# Patient Record
Sex: Female | Born: 1960 | Race: White | Hispanic: No | Marital: Married | Smoking: Never smoker
Health system: Southern US, Community
[De-identification: ages and names within clinical notes are randomized; demographics above are authoritative.]

## PROBLEM LIST (undated history)

## (undated) DIAGNOSIS — C439 Malignant melanoma of skin, unspecified: Secondary | ICD-10-CM

## (undated) DIAGNOSIS — I10 Essential (primary) hypertension: Secondary | ICD-10-CM

## (undated) DIAGNOSIS — S82009A Unspecified fracture of unspecified patella, initial encounter for closed fracture: Secondary | ICD-10-CM

## (undated) DIAGNOSIS — E7800 Pure hypercholesterolemia, unspecified: Secondary | ICD-10-CM

## (undated) DIAGNOSIS — E039 Hypothyroidism, unspecified: Secondary | ICD-10-CM

## (undated) HISTORY — DX: Unspecified fracture of unspecified patella, initial encounter for closed fracture: S82.009A

## (undated) HISTORY — DX: Essential (primary) hypertension: I10

## (undated) HISTORY — DX: Hypothyroidism, unspecified: E03.9

## (undated) HISTORY — PX: BUNIONECTOMY: SHX129

## (undated) HISTORY — DX: Malignant melanoma of skin, unspecified: C43.9

## (undated) HISTORY — DX: Pure hypercholesterolemia, unspecified: E78.00

---

## 2004-11-14 ENCOUNTER — Ambulatory Visit: Payer: Self-pay | Admitting: Unknown Physician Specialty

## 2005-12-24 ENCOUNTER — Ambulatory Visit: Payer: Self-pay | Admitting: Unknown Physician Specialty

## 2006-12-29 ENCOUNTER — Ambulatory Visit: Payer: Self-pay | Admitting: Unknown Physician Specialty

## 2007-04-12 ENCOUNTER — Ambulatory Visit: Payer: Self-pay | Admitting: Internal Medicine

## 2007-05-06 ENCOUNTER — Ambulatory Visit: Payer: Self-pay | Admitting: Podiatry

## 2008-01-04 ENCOUNTER — Ambulatory Visit: Payer: Self-pay | Admitting: Unknown Physician Specialty

## 2008-02-28 ENCOUNTER — Ambulatory Visit: Payer: Self-pay | Admitting: Unknown Physician Specialty

## 2009-07-02 ENCOUNTER — Emergency Department: Payer: Self-pay | Admitting: Emergency Medicine

## 2010-01-22 ENCOUNTER — Ambulatory Visit: Payer: Self-pay | Admitting: Unknown Physician Specialty

## 2011-02-04 ENCOUNTER — Ambulatory Visit: Payer: Self-pay | Admitting: Unknown Physician Specialty

## 2012-01-07 LAB — HM PAP SMEAR: HM Pap smear: NEGATIVE

## 2012-09-30 ENCOUNTER — Encounter: Payer: Self-pay | Admitting: Internal Medicine

## 2012-09-30 ENCOUNTER — Ambulatory Visit (INDEPENDENT_AMBULATORY_CARE_PROVIDER_SITE_OTHER): Payer: BC Managed Care – PPO | Admitting: Internal Medicine

## 2012-09-30 VITALS — BP 114/75 | HR 65 | Temp 97.5°F | Ht 64.0 in | Wt 125.0 lb

## 2012-09-30 DIAGNOSIS — C439 Malignant melanoma of skin, unspecified: Secondary | ICD-10-CM

## 2012-09-30 DIAGNOSIS — E039 Hypothyroidism, unspecified: Secondary | ICD-10-CM

## 2012-09-30 DIAGNOSIS — I10 Essential (primary) hypertension: Secondary | ICD-10-CM

## 2012-09-30 DIAGNOSIS — E7800 Pure hypercholesterolemia, unspecified: Secondary | ICD-10-CM

## 2012-09-30 MED ORDER — LEVOTHYROXINE SODIUM 75 MCG PO TABS: 75.0000 ug | ORAL_TABLET | Freq: Every day | ORAL | Status: DC

## 2012-09-30 NOTE — Patient Instructions (Addendum)
It was nice seeing you today.  I am glad you have been doing well.  Let me know if you need anything. 

## 2012-09-30 NOTE — Assessment & Plan Note (Signed)
Seeing dermatology regularly.  

## 2012-09-30 NOTE — Progress Notes (Signed)
  Subjective:    Patient ID: Kathryn Francis, female    DOB: 1961-01-25, 51 y.o.   MRN: [id number]  HPI 51 year old female with past history of hypertension, hypercholesterolemia and hypothyroidism who comes in today for a scheduled follow up.  She states she is doing well.  Had an episode of increased heart racing several weeks ago.  Was seen at [facility].  Referred to Dr Darrold Junker.  Had a stress test and Holter.  Blood pressure was lower.  Lisinopril was decreased to 1/2 tablet per day.  She has adjusted her diet.  Lost weight.  Feels better.  Has been exercising.  No cardiac symptoms with increased activity or exertion.    Past Medical History  Diagnosis Date  . Hypertension   . Hypercholesterolemia   . Hypothyroidism   . Melanoma   . Patella fracture     right    Review of Systems Patient denies any headache, lightheadedness or dizziness.  No sinus or allergy symptoms.  No chest pain, tightness or palpitations currently.   No increased shortness of breath, cough or congestion.  No nausea or vomiting.  No abdominal pain or cramping.  No bowel change, such as diarrhea, constipation, BRBPR or melana.  No urine change.  Stays very physically active.       Objective:   Physical Exam Filed Vitals:   09/30/12 0830  BP: 114/75  Pulse: 65  Temp: 97.5 F (57.32 C)   51 year old female in no acute distress.   HEENT:  Nares - clear.  OP- without lesions or erythema.  NECK:  Supple, nontender.  No audible bruit.   HEART:  Appears to be regular. LUNGS:  Without crackles or wheezing audible.  Respirations even and unlabored.   RADIAL PULSE:  Equal bilaterally.  ABDOMEN:  Soft, nontender.  No audible abdominal bruit.   EXTREMITIES:  No increased edema to be present.                  Assessment & Plan:  CARDIOVASCULAR.  Had increased heart racing recently. Saw Dr Darrold Junker.  Had stress test and holter monitor. Lisinopril decreased.  Currently asymptomatic.  Stays physically active.  Check thyroid  function.    POSSIBLE RAYNAUD'S.  Stable.    HEALTH MAINTENANCE.  Breasts, pelvics and paps are done through Dr Santina Evans office.  Mammograms are done through there as well.  Obtain records.

## 2012-10-03 ENCOUNTER — Encounter: Payer: Self-pay | Admitting: Internal Medicine

## 2012-10-03 NOTE — Assessment & Plan Note (Signed)
Blood pressure under good control.  Will stop the Triam/HCTZ.  Follow pressures.  Check metabolic panel.

## 2012-10-03 NOTE — Assessment & Plan Note (Addendum)
Has adjusted her diet.  Lost weight.  Check lipid panel.  Not taking Lovaza.  On Lipitor.    

## 2012-10-03 NOTE — Assessment & Plan Note (Signed)
On Synthroid.  Check tsh.

## 2012-10-04 ENCOUNTER — Telehealth: Payer: Self-pay | Admitting: Internal Medicine

## 2012-10-04 ENCOUNTER — Other Ambulatory Visit (INDEPENDENT_AMBULATORY_CARE_PROVIDER_SITE_OTHER): Payer: BC Managed Care – PPO

## 2012-10-04 DIAGNOSIS — E7800 Pure hypercholesterolemia, unspecified: Secondary | ICD-10-CM

## 2012-10-04 DIAGNOSIS — I10 Essential (primary) hypertension: Secondary | ICD-10-CM

## 2012-10-04 DIAGNOSIS — E039 Hypothyroidism, unspecified: Secondary | ICD-10-CM

## 2012-10-04 LAB — LIPID PANEL
Cholesterol: 182 mg/dL (ref 0–200)
HDL: 46.3 mg/dL (ref >39.00)
LDL Cholesterol: 103 mg/dL — ABNORMAL HIGH (ref 0–99)
Total CHOL/HDL Ratio: 4
Triglycerides: 166 mg/dL — ABNORMAL HIGH (ref 0.0–149.0)
VLDL: 33.2 mg/dL (ref 0.0–40.0)

## 2012-10-04 LAB — BASIC METABOLIC PANEL
BUN: 13 mg/dL (ref 6–23)
CO2: 28 mEq/L (ref 19–32)
Calcium: 9.4 mg/dL (ref 8.4–10.5)
Chloride: 104 mEq/L (ref 96–112)
Creatinine, Ser: 0.8 mg/dL (ref 0.4–1.2)
GFR: 84.7 mL/min (ref >60.00)
Glucose, Bld: 88 mg/dL (ref 70–99)
Potassium: 4 mEq/L (ref 3.5–5.1)
Sodium: 140 mEq/L (ref 135–145)

## 2012-10-04 LAB — TSH: TSH: 2.12 u[IU]/mL (ref 0.35–5.50)

## 2012-10-04 NOTE — Telephone Encounter (Signed)
Pt notified of lab results via my chart messaging.  

## 2012-10-07 ENCOUNTER — Encounter: Payer: Self-pay | Admitting: Internal Medicine

## 2012-10-14 ENCOUNTER — Encounter: Payer: Self-pay | Admitting: Adult Health

## 2012-10-14 ENCOUNTER — Ambulatory Visit (INDEPENDENT_AMBULATORY_CARE_PROVIDER_SITE_OTHER): Payer: BC Managed Care – PPO | Admitting: Adult Health

## 2012-10-14 ENCOUNTER — Telehealth: Payer: Self-pay | Admitting: General Practice

## 2012-10-14 VITALS — BP 120/70 | HR 88 | Temp 97.6°F | Ht 64.0 in | Wt 125.8 lb

## 2012-10-14 DIAGNOSIS — M545 Low back pain, unspecified: Secondary | ICD-10-CM

## 2012-10-14 MED ORDER — HYDROCODONE-ACETAMINOPHEN 5-325 MG PO TABS: 1.0000 | ORAL_TABLET | Freq: Four times a day (QID) | ORAL | Status: DC | PRN

## 2012-10-14 MED ORDER — CYCLOBENZAPRINE HCL 5 MG PO TABS: 5.0000 mg | ORAL_TABLET | Freq: Three times a day (TID) | ORAL | Status: DC | PRN

## 2012-10-14 NOTE — Patient Instructions (Addendum)
  Use ice for 20 mins to affected area to reduce swelling. You can alternate with heat to relax the muscles.  Flexeril is a muscle relaxer. This can make you drowsy. Please use caution when you take this.  Norco is a pain medication. This can also make you drowsy.  Place a pillow below your knees when you lie on your back. Place a pillow between your knees if you sleep on your side.  Your symptoms should begin to improve. If they have not done so by Monday, please call us.

## 2012-10-14 NOTE — Telephone Encounter (Signed)
Pt fell and slipped hit her back. Neighbor is an OT and said they thought she hit her sciatic nerve. Happened Christmas eve. Scheduled appt today at 2:30. Advised OTC pain meds have not helped pt has been unable to sleep.

## 2012-10-14 NOTE — Progress Notes (Signed)
  Subjective:    Patient ID: Kathryn Francis, female    DOB: 07-03-1961, 51 y.o.   MRN: [id number]  HPI  Ms. Delahoussaye presents to clinic today s/p fall on Christmas Eve. She slipped and fell, hitting her lower back mostly on the right, lower side. She continued on with her activities without any problems. When she woke up on Christmas day, she was in pain and found it hard to move around. She has tried heat to the area and advil for pain without much relief. She has not noticed any swelling or bruising of the area. She is c/o pain that shoots down the right buttocks and leg.  Current Outpatient Prescriptions on File Prior to Visit  Medication Sig Dispense Refill  . atorvastatin (LIPITOR) 10 MG tablet Take 10 mg by mouth daily.      Marland Kitchen levothyroxine (SYNTHROID) 75 MCG tablet Take 1 tablet (75 mcg total) by mouth daily.  30 tablet  5  . lisinopril (PRINIVIL,ZESTRIL) 10 MG tablet Take 1/2 tablet once a day      . metoprolol tartrate (LOPRESSOR) 25 MG tablet Take 1/2 tablet bid      . triamterene-hydrochlorothiazide (MAXZIDE-25) 37.5-25 MG per tablet Take 1 tablet by mouth daily.         Review of Systems  Constitutional: Positive for activity change. Negative for appetite change and fatigue.  Respiratory: Negative.   Cardiovascular: Negative.   Musculoskeletal: Positive for back pain.  Neurological: Negative for weakness and numbness.  Psychiatric/Behavioral: Negative.         Objective:   Physical Exam  Constitutional: She is oriented to person, place, and time. She appears well-developed and well-nourished.  Cardiovascular: Normal rate and regular rhythm.   Pulmonary/Chest: Breath sounds normal.  Musculoskeletal:       Guarded movement. Pain noted on palpation of lower back.   Neurological: She is alert and oriented to person, place, and time. She exhibits normal muscle tone. Coordination normal.  Skin:       No bruising noted on back.  Psychiatric: She has a normal mood and affect. Her  behavior is normal. Judgment and thought content normal.          Assessment & Plan:

## 2012-10-14 NOTE — Assessment & Plan Note (Signed)
Secondary to fall. Sciatic nerve involvement. Ice/heat to area. Flexeril for muscle spasms and relaxation. Norco for pain.

## 2012-11-15 ENCOUNTER — Telehealth: Payer: Self-pay | Admitting: *Deleted

## 2012-11-15 NOTE — Telephone Encounter (Signed)
Need to clarify with pt how she is taking this medication and how often.  Let me know.

## 2012-11-15 NOTE — Telephone Encounter (Signed)
Rx request:  Zolpidem Tartrate 10 mg  Take 1 tablet by mouth at bedtime as needed

## 2012-11-16 MED ORDER — ZOLPIDEM TARTRATE 10 MG PO TABS: ORAL_TABLET | ORAL | Status: DC

## 2012-11-16 NOTE — Telephone Encounter (Signed)
Prescription faxed to pharmacy.

## 2012-11-16 NOTE — Telephone Encounter (Signed)
Called patient about medication. Patient stated that she that Zolpidem Tartrate 10 mg 1/2 tablet at bedtime as needed.

## 2012-11-16 NOTE — Telephone Encounter (Signed)
rx printed for ambien refill.  Ok to call or fax in.  Please notify pt when done.

## 2012-12-04 ENCOUNTER — Other Ambulatory Visit: Payer: Self-pay

## 2012-12-08 ENCOUNTER — Encounter: Payer: Self-pay | Admitting: Internal Medicine

## 2012-12-08 ENCOUNTER — Telehealth: Payer: Self-pay | Admitting: *Deleted

## 2012-12-08 NOTE — Telephone Encounter (Signed)
Patient stated that she has had the headache since Sunday morning when she woke. The headache has not changed it has stayed at a dull pain and only in her forehead. Patient noticed heart racing Monday morning. She is not having chest pains. Advised patient if symptoms worsen to go to acute care to get evaluation.

## 2012-12-08 NOTE — Telephone Encounter (Signed)
I am concerned that heart palpitations may represent atrial fibrillation or other arrhythmia. Please schedule pt for visit tomorrow.  Agree that she should proceed to ED for evaluation sooner if symptoms of heart palpitations are persistent or if any chest pain.

## 2012-12-08 NOTE — Telephone Encounter (Signed)
Called patient concerning my chart message stating that she has been having a dull headache since Sunday. Also that her heart has been racing off and on since Monday. Patient stated that the headache has been a dull ache in the front of her forehead and it has remained the same since she woke up on Sunday morning. Patient also stated that she noticed that her heart was racing Monday morning. Her heart has continued to race off and on since. Patient stated that she does not have any sinus problems. Patient also stated that she thought that heart racing could pertain to caffeine. Advised patient to seek acute care if symptoms worsen or if she experiences any chest pain. Patient stated that she would.

## 2012-12-09 NOTE — Telephone Encounter (Signed)
Has appointment Raquel tomorrow.

## 2012-12-10 ENCOUNTER — Encounter: Payer: Self-pay | Admitting: Adult Health

## 2012-12-10 ENCOUNTER — Ambulatory Visit (INDEPENDENT_AMBULATORY_CARE_PROVIDER_SITE_OTHER): Payer: BC Managed Care – PPO | Admitting: Adult Health

## 2012-12-10 VITALS — BP 110/70 | HR 62 | Temp 97.8°F | Resp 12 | Wt 122.0 lb

## 2012-12-10 DIAGNOSIS — R519 Headache, unspecified: Secondary | ICD-10-CM

## 2012-12-10 DIAGNOSIS — R51 Headache: Secondary | ICD-10-CM

## 2012-12-10 MED ORDER — FLUTICASONE PROPIONATE 50 MCG/ACT NA SUSP: NASAL | Status: DC

## 2012-12-10 NOTE — Assessment & Plan Note (Signed)
Symptoms not consistent with migraine or cluster headache. Not consistent with tension HA. Not consistent with temporal arteritis  Frontal sinus pressure ?  Will order flonase nasal spray, sudafed PE and tylenol. If patient not improved by Monday, I have asked her to come in for labs - sed rate.

## 2012-12-10 NOTE — Patient Instructions (Addendum)
  I have ordered Flonase nasal spray. This is a steroid spray that will help decrease any inflammation of the sinuses.  Try Sudafed PE which is a decongestant.  Also try some saline spray to use multiple times throughout the day to help keep your sinuses flushed.  Try tylenol 325 mg - 2 tablets every 4-6 hours as needed for pain.  If you do not feel any improvement by Monday, please call so that we can order some labs.

## 2012-12-10 NOTE — Progress Notes (Signed)
Subjective:    Patient ID: Kathryn Francis, female    DOB: 1961/01/21, 52 y.o.   MRN: [id number]  HPI  Patient is a pleasant 52 y/o female who presents to clinic today with c/o frontal HA since Sunday morning when she woke up. She describes the pain as a dull ache. She tried advil 600 mg x 2 on Monday and tried 2 Aleve on Tuesday. Her symptoms did not improve so she did not take any more. She denies visual disturbances, n/v, dizziness. She does not feel any sinus pressure. She has not been sick recently. She has not changed any medications. Her pain is strictly on forehead. No temporal tenderness or throbbing.   Current Outpatient Prescriptions on File Prior to Visit  Medication Sig Dispense Refill  . atorvastatin (LIPITOR) 10 MG tablet Take 10 mg by mouth daily.      Marland Kitchen levothyroxine (SYNTHROID) 75 MCG tablet Take 1 tablet (75 mcg total) by mouth daily.  30 tablet  5  . lisinopril (PRINIVIL,ZESTRIL) 10 MG tablet Take 1/2 tablet once a day      . metoprolol tartrate (LOPRESSOR) 25 MG tablet Take 1/2 tablet daily      . zolpidem (AMBIEN) 10 MG tablet 1/2 tablet q hs prn.  15 tablet  0  . cyclobenzaprine (FLEXERIL) 5 MG tablet Take 1 tablet (5 mg total) by mouth 3 (three) times daily as needed for muscle spasms.  30 tablet  1  . HYDROcodone-acetaminophen (NORCO) 5-325 MG per tablet Take 1 tablet by mouth every 6 (six) hours as needed for pain.  30 tablet  0  . triamterene-hydrochlorothiazide (MAXZIDE-25) 37.5-25 MG per tablet Take 1 tablet by mouth daily.       No current facility-administered medications on file prior to visit.     Review of Systems  Constitutional: Negative for fever and chills.  HENT: Negative for ear pain, congestion, sore throat, facial swelling, rhinorrhea, neck pain, postnasal drip, sinus pressure, tinnitus and ear discharge.   Eyes: Negative for photophobia, pain and visual disturbance.  Respiratory: Negative for cough, chest tightness and shortness of breath.    Cardiovascular: Negative for chest pain.       Patient has an irregular heartbeat which has recently been evaluated by cardiology.  Gastrointestinal: Negative for nausea, vomiting and diarrhea.  Neurological: Positive for headaches. Negative for dizziness, seizures, syncope, weakness, light-headedness and numbness.       Headache in forehead area ongoing since Sunday.  Psychiatric/Behavioral: Negative.     BP 110/70  Pulse 62  Temp(Src) 97.8 F (36.6 C) (Oral)  Wt 122 lb (55.339 kg)  BMI 20.93 kg/m2  SpO2 97%     Objective:   Physical Exam  Constitutional: She is oriented to person, place, and time. She appears well-developed and well-nourished. No distress.  HENT:  Head: Normocephalic and atraumatic.  Right Ear: External ear normal.  Left Ear: External ear normal.  Mouth/Throat: Oropharynx is clear and moist.  No pain or tenderness with palpating sinuses. Nasal mucosa is slightly erythematous. No tenderness over temporal area. TM are WNL.  Neck: Normal range of motion. Neck supple.  Cardiovascular: Normal rate.   Irregularly, regular rhythm  Pulmonary/Chest: Effort normal and breath sounds normal.  Musculoskeletal: Normal range of motion. She exhibits no edema and no tenderness.  No neck tenderness. Full ROM without tenderness/pain produced.  Neurological: She is alert and oriented to person, place, and time.  Psychiatric: She has a normal mood and affect. Her behavior is normal. Judgment  and thought content normal.        Assessment & Plan:

## 2012-12-12 ENCOUNTER — Encounter: Payer: Self-pay | Admitting: Adult Health

## 2012-12-28 ENCOUNTER — Other Ambulatory Visit: Payer: Self-pay | Admitting: *Deleted

## 2012-12-29 MED ORDER — METOPROLOL TARTRATE 25 MG PO TABS: ORAL_TABLET | ORAL | Status: DC

## 2012-12-29 NOTE — Telephone Encounter (Signed)
Sent in to pharmacy.  

## 2013-02-17 LAB — HM MAMMOGRAPHY

## 2013-03-30 ENCOUNTER — Telehealth: Payer: Self-pay | Admitting: Internal Medicine

## 2013-03-30 ENCOUNTER — Ambulatory Visit (INDEPENDENT_AMBULATORY_CARE_PROVIDER_SITE_OTHER): Payer: BC Managed Care – PPO | Admitting: Internal Medicine

## 2013-03-30 ENCOUNTER — Encounter: Payer: Self-pay | Admitting: Internal Medicine

## 2013-03-30 VITALS — BP 110/70 | HR 69 | Temp 98.1°F | Ht 64.0 in | Wt 123.5 lb

## 2013-03-30 DIAGNOSIS — Z1211 Encounter for screening for malignant neoplasm of colon: Secondary | ICD-10-CM

## 2013-03-30 DIAGNOSIS — R51 Headache: Secondary | ICD-10-CM

## 2013-03-30 DIAGNOSIS — R519 Headache, unspecified: Secondary | ICD-10-CM

## 2013-03-30 DIAGNOSIS — E7800 Pure hypercholesterolemia, unspecified: Secondary | ICD-10-CM

## 2013-03-30 DIAGNOSIS — E039 Hypothyroidism, unspecified: Secondary | ICD-10-CM

## 2013-03-30 DIAGNOSIS — C439 Malignant melanoma of skin, unspecified: Secondary | ICD-10-CM

## 2013-03-30 DIAGNOSIS — I10 Essential (primary) hypertension: Secondary | ICD-10-CM

## 2013-03-30 NOTE — Assessment & Plan Note (Signed)
Not an issue now.  Follow.    

## 2013-03-30 NOTE — Progress Notes (Signed)
Subjective:    Patient ID: Kathryn Francis, female    DOB: [birth year], 52 y.o.   MRN: [id number]  HPI 52 year old female with past history of hypertension, hypercholesterolemia and hypothyroidism who comes in today for a scheduled follow up.  She states she is doing well.   She has adjusted her diet.  Lost weight.  Feels better.  Has been exercising.  No cardiac symptoms with increased activity or exertion.  Occasionally will notice some increased heart rate when she has an excessive amount of caffeine.  Much better.  If she watches what she is eating/drinking - does well.  Overall feels good.    Past Medical History  Diagnosis Date  . Hypertension   . Hypercholesterolemia   . Hypothyroidism   . Melanoma   . Patella fracture     right    Current Outpatient Prescriptions on File Prior to Visit  Medication Sig Dispense Refill  . atorvastatin (LIPITOR) 10 MG tablet Take 10 mg by mouth daily.      Marland Kitchen levothyroxine (SYNTHROID) 75 MCG tablet Take 1 tablet (75 mcg total) by mouth daily.  30 tablet  5  . lisinopril (PRINIVIL,ZESTRIL) 10 MG tablet Take 1/2 tablet once a day      . metoprolol tartrate (LOPRESSOR) 25 MG tablet Take 1/2 tablet daily  30 tablet  2  . zolpidem (AMBIEN) 10 MG tablet 1/2 tablet q hs prn.  15 tablet  0  . cyclobenzaprine (FLEXERIL) 5 MG tablet Take 1 tablet (5 mg total) by mouth 3 (three) times daily as needed for muscle spasms.  30 tablet  1  . fluticasone (FLONASE) 50 MCG/ACT nasal spray 2 sprays in each nostril daily  16 g  6  . HYDROcodone-acetaminophen (NORCO) 5-325 MG per tablet Take 1 tablet by mouth every 6 (six) hours as needed for pain.  30 tablet  0  . triamterene-hydrochlorothiazide (MAXZIDE-25) 37.5-25 MG per tablet Take 1 tablet by mouth daily.       No current facility-administered medications on file prior to visit.    Review of Systems Patient denies any headache, lightheadedness or dizziness.  No sinus or allergy symptoms.  No chest pain, tightness  or palpitations currently.   Palpitations improved.  No increased shortness of breath, cough or congestion.  No nausea or vomiting.  No acid reflux.  No abdominal pain or cramping. No bowel change, such as diarrhea, constipation, BRBPR or melana.  No urine change.  Stays very physically active.  No cardiac symptoms with increased activity or exertion.       Objective:   Physical Exam  Filed Vitals:   03/30/13 0800  BP: 110/70  Pulse: 69  Temp: 98.1 F (76.101 C)   52 year old female in no acute distress.   HEENT:  Nares - clear.  OP- without lesions or erythema.  NECK:  Supple, nontender.  No audible bruit.   HEART:  Appears to be regular. LUNGS:  Without crackles or wheezing audible.  Respirations even and unlabored.   RADIAL PULSE:  Equal bilaterally.  ABDOMEN:  Soft, nontender.  No audible abdominal bruit.   EXTREMITIES:  No increased edema to be present.                  Assessment & Plan:  CARDIOVASCULAR.  Had increased heart racing recently. Saw Dr Darrold Junker.  Had stress test and holter monitor. Lisinopril decreased.  Currently asymptomatic.  Stays physically active.   POSSIBLE RAYNAUD'S.  Stable.  HEALTH MAINTENANCE.  Breasts, pelvics and paps are done through Dr Santina Evans office.  Mammograms are done through there as well.  Obtain records.  Discussed with her regarding colon screening.  She was agreeable.  Will refer to GI for evaluation for screening colonoscopy.

## 2013-03-30 NOTE — Assessment & Plan Note (Signed)
Seeing dermatology regularly.  Just evaluated.  States everything checked out fine.

## 2013-03-30 NOTE — Assessment & Plan Note (Signed)
Has adjusted her diet.  Lost weight.  Check lipid panel.  Not taking Lovaza.  On Lipitor.  Last lipid profile much improved.

## 2013-03-30 NOTE — Assessment & Plan Note (Signed)
On Synthroid.  Follow tsh.    

## 2013-03-30 NOTE — Assessment & Plan Note (Signed)
Blood pressure under good control.  Off Triam/HCTZ.  Blood pressure doing well.  Follow metabolic panel.   

## 2013-03-30 NOTE — Telephone Encounter (Signed)
Pt wanted to see if you could get her colonscopy scheduled for either week of 05/16/13 or week of 05/30/13 if possible

## 2013-04-06 ENCOUNTER — Telehealth: Payer: Self-pay | Admitting: *Deleted

## 2013-04-06 NOTE — Telephone Encounter (Signed)
Patient concerned about colonoscopy referral has not received a call from office. Please advise patient stated Dr. Lorin Picket was making referral.

## 2013-04-07 NOTE — Telephone Encounter (Signed)
[facility] is on a 30-25 day waiting period to schedule a screening colonoscopy.

## 2013-04-07 NOTE — Telephone Encounter (Signed)
See note Re: referral

## 2013-04-07 NOTE — Telephone Encounter (Signed)
Left message on pt's voicemail with referral update

## 2013-04-15 ENCOUNTER — Other Ambulatory Visit: Payer: BC Managed Care – PPO

## 2013-04-27 ENCOUNTER — Other Ambulatory Visit: Payer: Self-pay | Admitting: *Deleted

## 2013-04-27 MED ORDER — LISINOPRIL 10 MG PO TABS: 5.0000 mg | ORAL_TABLET | Freq: Every day | ORAL | Status: DC

## 2013-05-01 ENCOUNTER — Encounter: Payer: Self-pay | Admitting: Internal Medicine

## 2013-05-03 ENCOUNTER — Other Ambulatory Visit (INDEPENDENT_AMBULATORY_CARE_PROVIDER_SITE_OTHER): Payer: BC Managed Care – PPO

## 2013-05-03 ENCOUNTER — Telehealth: Payer: Self-pay | Admitting: Internal Medicine

## 2013-05-03 DIAGNOSIS — E7800 Pure hypercholesterolemia, unspecified: Secondary | ICD-10-CM

## 2013-05-03 DIAGNOSIS — I10 Essential (primary) hypertension: Secondary | ICD-10-CM

## 2013-05-03 LAB — BASIC METABOLIC PANEL
BUN: 14 mg/dL (ref 6–23)
CO2: 28 mEq/L (ref 19–32)
Calcium: 9.7 mg/dL (ref 8.4–10.5)
Chloride: 106 mEq/L (ref 96–112)
Creatinine, Ser: 0.8 mg/dL (ref 0.4–1.2)
GFR: 76.34 mL/min (ref >60.00)
Glucose, Bld: 91 mg/dL (ref 70–99)
Potassium: 4.7 mEq/L (ref 3.5–5.1)
Sodium: 140 mEq/L (ref 135–145)

## 2013-05-03 LAB — LIPID PANEL
Cholesterol: 200 mg/dL (ref 0–200)
HDL: 48.8 mg/dL (ref >39.00)
LDL Cholesterol: 113 mg/dL — ABNORMAL HIGH (ref 0–99)
Total CHOL/HDL Ratio: 4
Triglycerides: 189 mg/dL — ABNORMAL HIGH (ref 0.0–149.0)
VLDL: 37.8 mg/dL (ref 0.0–40.0)

## 2013-05-03 MED ORDER — LEVOTHYROXINE SODIUM 75 MCG PO TABS
75.0000 ug | ORAL_TABLET | Freq: Every day | ORAL | Status: DC
Start: 2013-05-03 — End: 2014-05-23

## 2013-05-03 NOTE — Telephone Encounter (Signed)
Pt came in today for labs and checking on her 2 rx  On was linisipril and her thyroid med Please advise

## 2013-05-03 NOTE — Telephone Encounter (Signed)
Lisinopril was refilled on 04/27/13, I sent in refill today for Levothyroxine. Pt informed

## 2013-05-04 ENCOUNTER — Encounter: Payer: Self-pay | Admitting: Internal Medicine

## 2013-05-18 ENCOUNTER — Other Ambulatory Visit: Payer: Self-pay | Admitting: *Deleted

## 2013-05-18 MED ORDER — ZOLPIDEM TARTRATE 10 MG PO TABS: ORAL_TABLET | ORAL | Status: DC

## 2013-05-20 ENCOUNTER — Ambulatory Visit: Payer: Self-pay | Admitting: Adult Health

## 2013-05-20 ENCOUNTER — Other Ambulatory Visit: Payer: Self-pay | Admitting: Internal Medicine

## 2013-05-20 ENCOUNTER — Encounter: Payer: Self-pay | Admitting: Adult Health

## 2013-05-20 ENCOUNTER — Ambulatory Visit (INDEPENDENT_AMBULATORY_CARE_PROVIDER_SITE_OTHER): Payer: BC Managed Care – PPO | Admitting: Adult Health

## 2013-05-20 VITALS — BP 112/70 | HR 87 | Temp 97.8°F | Resp 12 | Wt 125.0 lb

## 2013-05-20 DIAGNOSIS — T148XXA Other injury of unspecified body region, initial encounter: Secondary | ICD-10-CM

## 2013-05-20 MED ORDER — CEPHALEXIN 500 MG PO CAPS: 500.0000 mg | ORAL_CAPSULE | Freq: Two times a day (BID) | ORAL | Status: DC

## 2013-05-20 NOTE — Assessment & Plan Note (Signed)
Will send for x-ray to evaluate for foreign object. Treat empirically for infection with Keflex 500 mg twice a day x10 days. Keep the area clean and dry. May apply Neosporin.

## 2013-05-20 NOTE — Progress Notes (Signed)
  Subjective:    Patient ID: Kathryn Francis, female    DOB: 1961/01/31, 52 y.o.   MRN: [id number]  HPI Patient is a pleasant 52 year old female who presents to clinic with left heel puncture wound. [place] when she stepped on something sharp. She reports mild bleeding which stopped quickly. She did not notice anything stuck in her foot. No fever, chills. No drainage from area. She reports her heel is becoming more tender and very hard to walk on. She has also noticed a dark area within the small wound and she is wondering if there is something stuck in there. She has been using peroxide to clean the area.  Current Outpatient Prescriptions on File Prior to Visit  Medication Sig Dispense Refill  . atorvastatin (LIPITOR) 10 MG tablet Take 10 mg by mouth daily.      . fluticasone (FLONASE) 50 MCG/ACT nasal spray 2 sprays in each nostril daily  16 g  6  . HYDROcodone-acetaminophen (NORCO) 5-325 MG per tablet Take 1 tablet by mouth every 6 (six) hours as needed for pain.  30 tablet  0  . levothyroxine (SYNTHROID) 75 MCG tablet Take 1 tablet (75 mcg total) by mouth daily.  30 tablet  11  . lisinopril (PRINIVIL,ZESTRIL) 10 MG tablet Take 0.5 tablets (5 mg total) by mouth daily. Take 1/2 tablet once a day  15 tablet  5  . metoprolol tartrate (LOPRESSOR) 25 MG tablet Take 1/2 tablet daily  30 tablet  2  . triamterene-hydrochlorothiazide (MAXZIDE-25) 37.5-25 MG per tablet Take 1 tablet by mouth daily.      Marland Kitchen zolpidem (AMBIEN) 10 MG tablet 1/2 tablet q hs prn.  15 tablet  0   No current facility-administered medications on file prior to visit.    Review of Systems  Skin:       Puncture injury left heel       Objective:   Physical Exam  Skin:  Small puncture wound approximately half inch in length. It does not appear deep. The skin is not well approximated and there is some discoloration noted. It is hard to tell whether there is a foreign object in it  or if this is a bruise. Surrounding area is tender to touch. There is no drainage. Patient up to date on tetanus shot          Assessment & Plan:

## 2013-05-30 ENCOUNTER — Encounter: Payer: Self-pay | Admitting: Adult Health

## 2013-06-24 ENCOUNTER — Other Ambulatory Visit: Payer: Self-pay | Admitting: *Deleted

## 2013-06-24 MED ORDER — METOPROLOL TARTRATE 25 MG PO TABS: ORAL_TABLET | ORAL | Status: DC

## 2013-07-18 ENCOUNTER — Ambulatory Visit: Payer: Self-pay | Admitting: Unknown Physician Specialty

## 2013-07-19 LAB — HM COLONOSCOPY

## 2013-07-20 LAB — PATHOLOGY REPORT

## 2013-07-26 ENCOUNTER — Encounter: Payer: Self-pay | Admitting: Internal Medicine

## 2013-07-26 DIAGNOSIS — Z860100 Personal history of colon polyps, unspecified: Secondary | ICD-10-CM | POA: Insufficient documentation

## 2013-08-06 ENCOUNTER — Encounter: Payer: Self-pay | Admitting: Internal Medicine

## 2013-08-25 ENCOUNTER — Other Ambulatory Visit: Payer: Self-pay

## 2013-09-30 ENCOUNTER — Other Ambulatory Visit: Payer: Self-pay | Admitting: Internal Medicine

## 2013-09-30 ENCOUNTER — Encounter: Payer: Self-pay | Admitting: Internal Medicine

## 2013-09-30 ENCOUNTER — Ambulatory Visit (INDEPENDENT_AMBULATORY_CARE_PROVIDER_SITE_OTHER): Payer: BC Managed Care – PPO | Admitting: Internal Medicine

## 2013-09-30 VITALS — BP 110/70 | Temp 97.8°F | Wt 129.5 lb

## 2013-09-30 DIAGNOSIS — M545 Low back pain, unspecified: Secondary | ICD-10-CM

## 2013-09-30 DIAGNOSIS — I10 Essential (primary) hypertension: Secondary | ICD-10-CM

## 2013-09-30 DIAGNOSIS — C439 Malignant melanoma of skin, unspecified: Secondary | ICD-10-CM

## 2013-09-30 DIAGNOSIS — Z86010 Personal history of colonic polyps: Secondary | ICD-10-CM

## 2013-09-30 DIAGNOSIS — E7800 Pure hypercholesterolemia, unspecified: Secondary | ICD-10-CM

## 2013-09-30 DIAGNOSIS — E039 Hypothyroidism, unspecified: Secondary | ICD-10-CM

## 2013-09-30 DIAGNOSIS — T148XXA Other injury of unspecified body region, initial encounter: Secondary | ICD-10-CM

## 2013-09-30 NOTE — Progress Notes (Signed)
Orders placed for labs

## 2013-09-30 NOTE — Progress Notes (Addendum)
  Subjective:    Patient ID: Kathryn Francis, female    DOB: 10-10-1961, 52 y.o.   MRN: [id number]  HPI 52 year old female with past history of hypertension, hypercholesterolemia and hypothyroidism who comes in today for a scheduled follow up.  She states she is doing well.   She has adjusted her diet.  Lost weight.  Feels better.  Has been exercising.  No cardiac symptoms with increased activity or exertion.   Overall feels good.  She is planning to retire 6/15.  Previous puncture wound healed.  Sees Dr Haskel Khan.  Last evaluated 3/14.     Past Medical History  Diagnosis Date  . Hypertension   . Hypercholesterolemia   . Hypothyroidism   . Melanoma   . Patella fracture     right    Current Outpatient Prescriptions on File Prior to Visit  Medication Sig Dispense Refill  . atorvastatin (LIPITOR) 10 MG tablet TAKE 1 TABLET BY MOUTH ONCE A DAY  30 tablet  5  . levothyroxine (SYNTHROID) 75 MCG tablet Take 1 tablet (75 mcg total) by mouth daily.  30 tablet  11  . lisinopril (PRINIVIL,ZESTRIL) 10 MG tablet Take 0.5 tablets (5 mg total) by mouth daily. Take 1/2 tablet once a day  15 tablet  5  . metoprolol tartrate (LOPRESSOR) 25 MG tablet Take 1/2 tablet daily  30 tablet  2  . zolpidem (AMBIEN) 10 MG tablet 1/2 tablet q hs prn.  15 tablet  0   No current facility-administered medications on file prior to visit.    Review of Systems Patient denies any headache, lightheadedness or dizziness.  No sinus or allergy symptoms.  No chest pain, tightness or palpitations currently.   Palpitations improved.  No increased shortness of breath, cough or congestion.  No nausea or vomiting.  No acid reflux.  No abdominal pain or cramping. No bowel change, such as diarrhea, constipation, BRBPR or melana.  No urine change.  Stays very physically active.  No cardiac symptoms with increased activity or exertion.       Objective:   Physical Exam  Filed Vitals:   09/30/13 0814  BP: 110/70  Temp: 97.8 F (36.6  C)   Blood pressure recheck:  118/78, pulse 69  52 year old female in no acute distress.   HEENT:  Nares - clear.  OP- without lesions or erythema.  NECK:  Supple, nontender.  No audible bruit.   HEART:  Appears to be regular. LUNGS:  Without crackles or wheezing audible.  Respirations even and unlabored.   RADIAL PULSE:  Equal bilaterally.  ABDOMEN:  Soft, nontender.  No audible abdominal bruit.   EXTREMITIES:  No increased edema to be present.                  Assessment & Plan:  CARDIOVASCULAR.  Had increased heart racing recently.  Saw Dr Darrold Junker.  Had stress test and holter monitor. Lisinopril decreased.  Currently asymptomatic.  Stays physically active.   POSSIBLE RAYNAUD'S.  Stable.    HEALTH MAINTENANCE.  Breasts, pelvics and paps are done through Dr Santina Evans office.  Mammograms are done through there as well.  Obtain records.  Colonoscopy 07/18/13 - cecal polyp and divertiuclosis.  States due f/u mammogram and physical in 3/15.    Addendum:  Pap 01/08/12 ([facility]) - negative with negative HPV.

## 2013-09-30 NOTE — Assessment & Plan Note (Addendum)
Blood pressure under good control.  Off Triam/HCTZ.  Blood pressure doing well.  Follow metabolic panel.   

## 2013-09-30 NOTE — Assessment & Plan Note (Signed)
Has adjusted her diet.  Lost weight.  Check lipid panel.  Not taking Lovaza.  On Lipitor.  Last lipid profile much improved.

## 2013-09-30 NOTE — Assessment & Plan Note (Addendum)
On synthroid.  Follow tsh.   

## 2013-09-30 NOTE — Progress Notes (Signed)
Pre-visit discussion using our clinic review tool. No additional management support is needed unless otherwise documented below in the visit note.  

## 2013-09-30 NOTE — Assessment & Plan Note (Addendum)
Colonoscopy results as outlined.  Doing well.

## 2013-10-02 ENCOUNTER — Encounter: Payer: Self-pay | Admitting: Internal Medicine

## 2013-10-02 NOTE — Assessment & Plan Note (Signed)
Not an issue for her now.  Follow.  

## 2013-10-02 NOTE — Assessment & Plan Note (Signed)
Resolved

## 2013-10-02 NOTE — Assessment & Plan Note (Signed)
Seeing dermatology regularly.  States everything checked out fine.   

## 2013-10-07 ENCOUNTER — Other Ambulatory Visit: Payer: BC Managed Care – PPO

## 2013-10-08 ENCOUNTER — Other Ambulatory Visit: Payer: Self-pay | Admitting: Internal Medicine

## 2013-10-10 ENCOUNTER — Other Ambulatory Visit (INDEPENDENT_AMBULATORY_CARE_PROVIDER_SITE_OTHER): Payer: BC Managed Care – PPO

## 2013-10-10 DIAGNOSIS — I10 Essential (primary) hypertension: Secondary | ICD-10-CM

## 2013-10-10 DIAGNOSIS — E7800 Pure hypercholesterolemia, unspecified: Secondary | ICD-10-CM

## 2013-10-10 DIAGNOSIS — E039 Hypothyroidism, unspecified: Secondary | ICD-10-CM

## 2013-10-10 DIAGNOSIS — C439 Malignant melanoma of skin, unspecified: Secondary | ICD-10-CM

## 2013-10-10 LAB — CBC WITH DIFFERENTIAL/PLATELET
Basophils Absolute: 0 10*3/uL (ref 0.0–0.1)
Basophils Relative: 0.7 % (ref 0.0–3.0)
Eosinophils Absolute: 0.1 10*3/uL (ref 0.0–0.7)
Eosinophils Relative: 2.8 % (ref 0.0–5.0)
HCT: 39.8 % (ref 36.0–46.0)
Hemoglobin: 13.5 g/dL (ref 12.0–15.0)
Lymphocytes Relative: 41.8 % (ref 12.0–46.0)
Lymphs Abs: 1.9 10*3/uL (ref 0.7–4.0)
MCHC: 33.9 g/dL (ref 30.0–36.0)
MCV: 84 fl (ref 78.0–100.0)
Monocytes Absolute: 0.4 10*3/uL (ref 0.1–1.0)
Monocytes Relative: 9.6 % (ref 3.0–12.0)
Neutro Abs: 2 10*3/uL (ref 1.4–7.7)
Neutrophils Relative %: 45.1 % (ref 43.0–77.0)
Platelets: 257 10*3/uL (ref 150.0–400.0)
RBC: 4.74 Mil/uL (ref 3.87–5.11)
RDW: 12.4 % (ref 11.5–14.6)
WBC: 4.4 10*3/uL — ABNORMAL LOW (ref 4.5–10.5)

## 2013-10-10 LAB — HEPATIC FUNCTION PANEL
ALT: 42 U/L — ABNORMAL HIGH (ref 0–35)
AST: 36 U/L (ref 0–37)
Albumin: 4.5 g/dL (ref 3.5–5.2)
Alkaline Phosphatase: 66 U/L (ref 39–117)
Bilirubin, Direct: 0.1 mg/dL (ref 0.0–0.3)
Total Bilirubin: 0.7 mg/dL (ref 0.3–1.2)
Total Protein: 8 g/dL (ref 6.0–8.3)

## 2013-10-10 LAB — BASIC METABOLIC PANEL
BUN: 15 mg/dL (ref 6–23)
CO2: 29 mEq/L (ref 19–32)
Calcium: 9.6 mg/dL (ref 8.4–10.5)
Chloride: 103 mEq/L (ref 96–112)
Creatinine, Ser: 0.8 mg/dL (ref 0.4–1.2)
GFR: 76.22 mL/min (ref >60.00)
Glucose, Bld: 103 mg/dL — ABNORMAL HIGH (ref 70–99)
Potassium: 5.4 mEq/L — ABNORMAL HIGH (ref 3.5–5.1)
Sodium: 140 mEq/L (ref 135–145)

## 2013-10-10 LAB — LIPID PANEL
Cholesterol: 219 mg/dL — ABNORMAL HIGH (ref 0–200)
HDL: 47.8 mg/dL (ref >39.00)
Total CHOL/HDL Ratio: 5
Triglycerides: 234 mg/dL — ABNORMAL HIGH (ref 0.0–149.0)
VLDL: 46.8 mg/dL — ABNORMAL HIGH (ref 0.0–40.0)

## 2013-10-10 LAB — TSH: TSH: 2.09 u[IU]/mL (ref 0.35–5.50)

## 2013-10-10 LAB — LDL CHOLESTEROL, DIRECT: Direct LDL: 134.1 mg/dL

## 2013-10-10 NOTE — Telephone Encounter (Signed)
Ok refill? 

## 2013-10-10 NOTE — Telephone Encounter (Signed)
Ok to refill x 1.  I ok'd it but I don't think it ever printed.

## 2013-10-12 ENCOUNTER — Other Ambulatory Visit: Payer: Self-pay | Admitting: Internal Medicine

## 2013-10-12 ENCOUNTER — Encounter: Payer: Self-pay | Admitting: *Deleted

## 2013-10-12 DIAGNOSIS — R945 Abnormal results of liver function studies: Secondary | ICD-10-CM

## 2013-10-12 DIAGNOSIS — E875 Hyperkalemia: Secondary | ICD-10-CM

## 2013-10-12 NOTE — Progress Notes (Signed)
Order placed for f/u labs.  

## 2013-10-14 ENCOUNTER — Other Ambulatory Visit (INDEPENDENT_AMBULATORY_CARE_PROVIDER_SITE_OTHER): Payer: BC Managed Care – PPO

## 2013-10-14 ENCOUNTER — Encounter: Payer: Self-pay | Admitting: Internal Medicine

## 2013-10-14 ENCOUNTER — Telehealth: Payer: Self-pay | Admitting: Internal Medicine

## 2013-10-14 DIAGNOSIS — R7989 Other specified abnormal findings of blood chemistry: Secondary | ICD-10-CM

## 2013-10-14 DIAGNOSIS — R945 Abnormal results of liver function studies: Secondary | ICD-10-CM

## 2013-10-14 DIAGNOSIS — E875 Hyperkalemia: Secondary | ICD-10-CM

## 2013-10-14 LAB — HEPATIC FUNCTION PANEL
ALT: 38 U/L — ABNORMAL HIGH (ref 0–35)
AST: 31 U/L (ref 0–37)
Albumin: 4.3 g/dL (ref 3.5–5.2)
Alkaline Phosphatase: 66 U/L (ref 39–117)
Bilirubin, Direct: 0 mg/dL (ref 0.0–0.3)
Total Bilirubin: 0.4 mg/dL (ref 0.3–1.2)
Total Protein: 7.5 g/dL (ref 6.0–8.3)

## 2013-10-14 LAB — POTASSIUM: Potassium: 4.7 mEq/L (ref 3.5–5.1)

## 2013-10-14 NOTE — Telephone Encounter (Signed)
Pt states she was told via mychart by Dr. Lorin Picket to call to schedule an US of the liver.  Pt states she is off work all next week and can go any time.  States after next week it would need to be any day but after 3 p.m.

## 2013-10-17 ENCOUNTER — Telehealth: Payer: Self-pay | Admitting: Internal Medicine

## 2013-10-17 DIAGNOSIS — R945 Abnormal results of liver function studies: Secondary | ICD-10-CM

## 2013-10-17 NOTE — Telephone Encounter (Signed)
Ultrasound scheduled and sent pt a my chart message.

## 2013-10-17 NOTE — Telephone Encounter (Signed)
Order placed for abdominal ultrasound.   

## 2013-10-17 NOTE — Telephone Encounter (Signed)
Please advise 

## 2013-10-19 ENCOUNTER — Ambulatory Visit: Payer: Self-pay | Admitting: Internal Medicine

## 2013-10-21 ENCOUNTER — Telehealth: Payer: Self-pay | Admitting: Internal Medicine

## 2013-10-21 NOTE — Telephone Encounter (Signed)
Pt notified of ultrasound results via my chart.

## 2013-10-22 ENCOUNTER — Telehealth: Payer: Self-pay | Admitting: Internal Medicine

## 2013-10-22 DIAGNOSIS — R7989 Other specified abnormal findings of blood chemistry: Secondary | ICD-10-CM

## 2013-10-22 DIAGNOSIS — R945 Abnormal results of liver function studies: Secondary | ICD-10-CM

## 2013-10-22 NOTE — Telephone Encounter (Signed)
Pt notified of lab results via my chart.  Needs a lab appt in 4-6 weeks.  Please schedule her for a non fasting lab appt in 4-6 weeks and contact her with a lab appt date and time.  Thanks.

## 2013-10-26 NOTE — Telephone Encounter (Signed)
Sent pt my chart message letting her know about her appointment 11/23/13

## 2013-11-06 ENCOUNTER — Other Ambulatory Visit: Payer: Self-pay | Admitting: Internal Medicine

## 2013-11-15 ENCOUNTER — Encounter: Payer: Self-pay | Admitting: Internal Medicine

## 2013-11-23 ENCOUNTER — Other Ambulatory Visit (INDEPENDENT_AMBULATORY_CARE_PROVIDER_SITE_OTHER): Payer: BC Managed Care – PPO

## 2013-11-23 DIAGNOSIS — R7989 Other specified abnormal findings of blood chemistry: Secondary | ICD-10-CM

## 2013-11-23 DIAGNOSIS — R945 Abnormal results of liver function studies: Secondary | ICD-10-CM

## 2013-11-24 ENCOUNTER — Encounter: Payer: Self-pay | Admitting: Internal Medicine

## 2013-11-24 LAB — HEPATIC FUNCTION PANEL
ALT: 27 U/L (ref 0–35)
AST: 28 U/L (ref 0–37)
Albumin: 4.1 g/dL (ref 3.5–5.2)
Alkaline Phosphatase: 62 U/L (ref 39–117)
Bilirubin, Direct: 0 mg/dL (ref 0.0–0.3)
Total Bilirubin: 0.6 mg/dL (ref 0.3–1.2)
Total Protein: 7 g/dL (ref 6.0–8.3)

## 2013-11-25 ENCOUNTER — Encounter: Payer: Self-pay | Admitting: Internal Medicine

## 2013-12-18 ENCOUNTER — Encounter: Payer: Self-pay | Admitting: Internal Medicine

## 2013-12-18 DIAGNOSIS — M8580 Other specified disorders of bone density and structure, unspecified site: Secondary | ICD-10-CM

## 2013-12-20 ENCOUNTER — Other Ambulatory Visit: Payer: Self-pay | Admitting: Internal Medicine

## 2014-03-29 ENCOUNTER — Ambulatory Visit: Payer: BC Managed Care – PPO | Admitting: Internal Medicine

## 2014-04-10 ENCOUNTER — Ambulatory Visit (INDEPENDENT_AMBULATORY_CARE_PROVIDER_SITE_OTHER): Payer: BC Managed Care – PPO | Admitting: Internal Medicine

## 2014-04-10 ENCOUNTER — Encounter: Payer: Self-pay | Admitting: Internal Medicine

## 2014-04-10 VITALS — BP 110/70 | HR 75 | Temp 98.0°F | Ht 64.0 in | Wt 128.8 lb

## 2014-04-10 DIAGNOSIS — M899 Disorder of bone, unspecified: Secondary | ICD-10-CM

## 2014-04-10 DIAGNOSIS — M949 Disorder of cartilage, unspecified: Secondary | ICD-10-CM

## 2014-04-10 DIAGNOSIS — M8580 Other specified disorders of bone density and structure, unspecified site: Secondary | ICD-10-CM

## 2014-04-10 DIAGNOSIS — E039 Hypothyroidism, unspecified: Secondary | ICD-10-CM

## 2014-04-10 DIAGNOSIS — I10 Essential (primary) hypertension: Secondary | ICD-10-CM

## 2014-04-10 DIAGNOSIS — Z86010 Personal history of colonic polyps: Secondary | ICD-10-CM

## 2014-04-10 DIAGNOSIS — E7800 Pure hypercholesterolemia, unspecified: Secondary | ICD-10-CM

## 2014-04-10 DIAGNOSIS — C439 Malignant melanoma of skin, unspecified: Secondary | ICD-10-CM

## 2014-04-10 DIAGNOSIS — D72819 Decreased white blood cell count, unspecified: Secondary | ICD-10-CM

## 2014-04-10 MED ORDER — ZOLPIDEM TARTRATE 10 MG PO TABS: ORAL_TABLET | ORAL | Status: DC

## 2014-04-10 NOTE — Progress Notes (Signed)
  Subjective:    Patient ID: Kathryn Francis, female    DOB: Nov 14, 1960, 53 y.o.   MRN: [id number]  HPI 53 year old female with past history of hypertension, hypercholesterolemia and hypothyroidism who comes in today for a scheduled follow up.  She states she is doing well.   She has adjusted her diet.  Lost weight.  Feels better.  Has been exercising.  No cardiac symptoms with increased activity or exertion.   Overall feels good.  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   Sees Dr Vernie Ammons.   Past Medical History  Diagnosis Date  . Hypertension   . Hypercholesterolemia   . Hypothyroidism   . Melanoma   . Patella fracture     right    Current Outpatient Prescriptions on File Prior to Visit  Medication Sig Dispense Refill  . atorvastatin (LIPITOR) 10 MG tablet TAKE 1 TABLET BY MOUTH ON TUES, THURS, & SUNDAY      . levothyroxine (SYNTHROID) 75 MCG tablet Take 1 tablet (75 mcg total) by mouth daily.  30 tablet  11  . lisinopril (PRINIVIL,ZESTRIL) 10 MG tablet TAKE HALF TABLET BY MOUTH DAILY  15 tablet  5  . metoprolol tartrate (LOPRESSOR) 25 MG tablet TAKE 1/2 TABLET BY MOUTH DAILY  30 tablet  2  . Multiple Vitamin (MULTIVITAMIN) tablet Take 1 tablet by mouth daily.      Marland Kitchen zolpidem (AMBIEN) 10 MG tablet TAKE 1/2 TALBET BY MOUTH AT BEDTIME AS NEEDED  15 tablet  0   No current facility-administered medications on file prior to visit.    Review of Systems Patient denies any headache, lightheadedness or dizziness.  No sinus or allergy symptoms.  No chest pain, tightness or palpitations currently.   Palpitations improved.  No increased shortness of breath, cough or congestion.  No nausea or vomiting.  No acid reflux.  No abdominal pain or cramping. No bowel change, such as diarrhea, constipation, BRBPR or melana.  No urine change.  Stays very physically active.  No cardiac symptoms with increased activity or exertion.  Overall feels good.       Objective:   Physical Exam  Filed Vitals:   04/10/14 0803  BP: 110/70  Pulse: 75  Temp: 98 F (36.7 C)   Blood pressure recheck:  118/78, pulse 35  52 year old female in no acute distress.   HEENT:  Nares - clear.  OP- without lesions or erythema.  NECK:  Supple, nontender.  No audible bruit.   HEART:  Appears to be regular. LUNGS:  Without crackles or wheezing audible.  Respirations even and unlabored.   RADIAL PULSE:  Equal bilaterally.  ABDOMEN:  Soft, nontender.  No audible abdominal bruit.   EXTREMITIES:  No increased edema to be present.                  Assessment & Plan:  CARDIOVASCULAR.  Had increased heart racing previously.  Saw Dr Saralyn Pilar.  Had stress test and holter monitor. Lisinopril decreased.  Currently asymptomatic.  Stays physically active.   POSSIBLE RAYNAUD'S.  Stable.    HEALTH MAINTENANCE.  Breasts, pelvics and paps are done through Dr Laury Axon office.  Mammograms are done through there as well.  Obtain records for this year.  Is up to date.  Colonoscopy 07/18/13 - cecal polyp and divertiuclosis.     Addendum:  Pap 01/08/12 ([facility]) - negative with negative HPV.

## 2014-04-10 NOTE — Progress Notes (Signed)
Pre visit review using our clinic review tool, if applicable. No additional management support is needed unless otherwise documented below in the visit note. 

## 2014-04-16 ENCOUNTER — Encounter: Payer: Self-pay | Admitting: Internal Medicine

## 2014-04-16 ENCOUNTER — Telehealth: Payer: Self-pay | Admitting: Internal Medicine

## 2014-04-16 NOTE — Assessment & Plan Note (Addendum)
Has adjusted her diet.  Lost weight.  Check lipid panel.  Not taking Lovaza.  On Lipitor.

## 2014-04-16 NOTE — Telephone Encounter (Signed)
She needs a fasting lab appt scheduled within the next 2-3 weeks.   Thanks.

## 2014-04-16 NOTE — Assessment & Plan Note (Signed)
Continue weight bearing exercise.  Plan for f/u bone density in the future.

## 2014-04-16 NOTE — Assessment & Plan Note (Signed)
Seeing dermatology regularly.  States everything checked out fine.

## 2014-04-16 NOTE — Assessment & Plan Note (Signed)
Colonoscopy results as outlined.  Doing well.  Follow up planned in 2024.

## 2014-04-16 NOTE — Assessment & Plan Note (Signed)
On synthroid.  Follow tsh.   

## 2014-04-16 NOTE — Assessment & Plan Note (Signed)
Blood pressure under good control.  Off Triam/HCTZ.  Blood pressure doing well.  Follow metabolic panel.

## 2014-05-05 ENCOUNTER — Other Ambulatory Visit (INDEPENDENT_AMBULATORY_CARE_PROVIDER_SITE_OTHER): Payer: BC Managed Care – PPO

## 2014-05-05 ENCOUNTER — Encounter (INDEPENDENT_AMBULATORY_CARE_PROVIDER_SITE_OTHER): Payer: Self-pay

## 2014-05-05 DIAGNOSIS — I10 Essential (primary) hypertension: Secondary | ICD-10-CM

## 2014-05-05 DIAGNOSIS — D72819 Decreased white blood cell count, unspecified: Secondary | ICD-10-CM

## 2014-05-05 DIAGNOSIS — E7800 Pure hypercholesterolemia, unspecified: Secondary | ICD-10-CM

## 2014-05-05 LAB — LIPID PANEL
Cholesterol: 202 mg/dL — ABNORMAL HIGH (ref 0–200)
HDL: 47.5 mg/dL (ref >39.00)
LDL Cholesterol: 110 mg/dL — ABNORMAL HIGH (ref 0–99)
NonHDL: 154.5
Total CHOL/HDL Ratio: 4
Triglycerides: 221 mg/dL — ABNORMAL HIGH (ref 0.0–149.0)
VLDL: 44.2 mg/dL — ABNORMAL HIGH (ref 0.0–40.0)

## 2014-05-05 LAB — CBC WITH DIFFERENTIAL/PLATELET
Basophils Absolute: 0 10*3/uL (ref 0.0–0.1)
Basophils Relative: 0.5 % (ref 0.0–3.0)
Eosinophils Absolute: 0.1 10*3/uL (ref 0.0–0.7)
Eosinophils Relative: 2.4 % (ref 0.0–5.0)
HCT: 39.2 % (ref 36.0–46.0)
Hemoglobin: 13.1 g/dL (ref 12.0–15.0)
Lymphocytes Relative: 36 % (ref 12.0–46.0)
Lymphs Abs: 1.7 10*3/uL (ref 0.7–4.0)
MCHC: 33.5 g/dL (ref 30.0–36.0)
MCV: 86.5 fl (ref 78.0–100.0)
Monocytes Absolute: 0.3 10*3/uL (ref 0.1–1.0)
Monocytes Relative: 7.3 % (ref 3.0–12.0)
Neutro Abs: 2.5 10*3/uL (ref 1.4–7.7)
Neutrophils Relative %: 53.8 % (ref 43.0–77.0)
Platelets: 259 10*3/uL (ref 150.0–400.0)
RBC: 4.53 Mil/uL (ref 3.87–5.11)
RDW: 12.5 % (ref 11.5–15.5)
WBC: 4.7 10*3/uL (ref 4.0–10.5)

## 2014-05-05 LAB — HEPATIC FUNCTION PANEL
ALT: 31 U/L (ref 0–35)
AST: 29 U/L (ref 0–37)
Albumin: 4.1 g/dL (ref 3.5–5.2)
Alkaline Phosphatase: 59 U/L (ref 39–117)
Bilirubin, Direct: 0 mg/dL (ref 0.0–0.3)
Total Bilirubin: 0.5 mg/dL (ref 0.2–1.2)
Total Protein: 7.4 g/dL (ref 6.0–8.3)

## 2014-05-05 LAB — BASIC METABOLIC PANEL
BUN: 11 mg/dL (ref 6–23)
CO2: 28 mEq/L (ref 19–32)
Calcium: 9.4 mg/dL (ref 8.4–10.5)
Chloride: 106 mEq/L (ref 96–112)
Creatinine, Ser: 0.8 mg/dL (ref 0.4–1.2)
GFR: 78.22 mL/min (ref >60.00)
Glucose, Bld: 104 mg/dL — ABNORMAL HIGH (ref 70–99)
Potassium: 4.7 mEq/L (ref 3.5–5.1)
Sodium: 139 mEq/L (ref 135–145)

## 2014-05-08 ENCOUNTER — Encounter: Payer: Self-pay | Admitting: Internal Medicine

## 2014-05-23 ENCOUNTER — Other Ambulatory Visit: Payer: Self-pay | Admitting: Internal Medicine

## 2014-05-28 ENCOUNTER — Encounter: Payer: Self-pay | Admitting: Internal Medicine

## 2014-05-31 ENCOUNTER — Other Ambulatory Visit: Payer: BC Managed Care – PPO

## 2014-06-06 ENCOUNTER — Other Ambulatory Visit: Payer: Self-pay | Admitting: Internal Medicine

## 2014-06-18 ENCOUNTER — Other Ambulatory Visit: Payer: Self-pay | Admitting: Internal Medicine

## 2014-08-08 ENCOUNTER — Encounter: Payer: Self-pay | Admitting: Internal Medicine

## 2014-10-05 ENCOUNTER — Encounter: Payer: Self-pay | Admitting: Internal Medicine

## 2014-10-05 NOTE — Telephone Encounter (Signed)
Will she be needing labs that day? Last done 05/10/14

## 2014-10-10 ENCOUNTER — Encounter: Payer: Self-pay | Admitting: Internal Medicine

## 2014-10-10 ENCOUNTER — Ambulatory Visit (INDEPENDENT_AMBULATORY_CARE_PROVIDER_SITE_OTHER): Payer: BC Managed Care – PPO | Admitting: Internal Medicine

## 2014-10-10 VITALS — BP 118/80 | HR 80 | Temp 97.1°F | Ht 64.0 in | Wt 130.2 lb

## 2014-10-10 DIAGNOSIS — E039 Hypothyroidism, unspecified: Secondary | ICD-10-CM

## 2014-10-10 DIAGNOSIS — R42 Dizziness and giddiness: Secondary | ICD-10-CM | POA: Insufficient documentation

## 2014-10-10 DIAGNOSIS — I10 Essential (primary) hypertension: Secondary | ICD-10-CM

## 2014-10-10 DIAGNOSIS — E7800 Pure hypercholesterolemia, unspecified: Secondary | ICD-10-CM

## 2014-10-10 LAB — CBC WITH DIFFERENTIAL/PLATELET
Basophils Absolute: 0 10*3/uL (ref 0.0–0.1)
Basophils Relative: 0.5 % (ref 0.0–3.0)
Eosinophils Absolute: 0.1 10*3/uL (ref 0.0–0.7)
Eosinophils Relative: 1.6 % (ref 0.0–5.0)
HCT: 40.4 % (ref 36.0–46.0)
Hemoglobin: 13.4 g/dL (ref 12.0–15.0)
Lymphocytes Relative: 30.7 % (ref 12.0–46.0)
Lymphs Abs: 1.8 10*3/uL (ref 0.7–4.0)
MCHC: 33.1 g/dL (ref 30.0–36.0)
MCV: 86.3 fl (ref 78.0–100.0)
Monocytes Absolute: 0.5 10*3/uL (ref 0.1–1.0)
Monocytes Relative: 7.7 % (ref 3.0–12.0)
Neutro Abs: 3.5 10*3/uL (ref 1.4–7.7)
Neutrophils Relative %: 59.5 % (ref 43.0–77.0)
Platelets: 262 10*3/uL (ref 150.0–400.0)
RBC: 4.69 Mil/uL (ref 3.87–5.11)
RDW: 12.5 % (ref 11.5–15.5)
WBC: 5.9 10*3/uL (ref 4.0–10.5)

## 2014-10-10 LAB — BASIC METABOLIC PANEL
BUN: 14 mg/dL (ref 6–23)
CO2: 26 mEq/L (ref 19–32)
Calcium: 9.4 mg/dL (ref 8.4–10.5)
Chloride: 105 mEq/L (ref 96–112)
Creatinine, Ser: 0.8 mg/dL (ref 0.4–1.2)
GFR: 77 mL/min (ref >60.00)
Glucose, Bld: 101 mg/dL — ABNORMAL HIGH (ref 70–99)
Potassium: 4.7 mEq/L (ref 3.5–5.1)
Sodium: 138 mEq/L (ref 135–145)

## 2014-10-10 LAB — LIPID PANEL
Cholesterol: 228 mg/dL — ABNORMAL HIGH (ref 0–200)
HDL: 44.8 mg/dL (ref >39.00)
NonHDL: 183.2
Total CHOL/HDL Ratio: 5
Triglycerides: 239 mg/dL — ABNORMAL HIGH (ref 0.0–149.0)
VLDL: 47.8 mg/dL — ABNORMAL HIGH (ref 0.0–40.0)

## 2014-10-10 LAB — HEPATIC FUNCTION PANEL
ALT: 23 U/L (ref 0–35)
AST: 27 U/L (ref 0–37)
Albumin: 4.4 g/dL (ref 3.5–5.2)
Alkaline Phosphatase: 61 U/L (ref 39–117)
Bilirubin, Direct: 0.1 mg/dL (ref 0.0–0.3)
Total Bilirubin: 0.7 mg/dL (ref 0.2–1.2)
Total Protein: 7.5 g/dL (ref 6.0–8.3)

## 2014-10-10 LAB — LDL CHOLESTEROL, DIRECT: Direct LDL: 125.7 mg/dL

## 2014-10-10 LAB — TSH: TSH: 1.14 u[IU]/mL (ref 0.35–4.50)

## 2014-10-10 NOTE — Progress Notes (Signed)
Subjective:    Patient ID: Kathryn Francis, female    DOB: Oct 06, 1961, 53 y.o.   MRN: [id number]  HPI 53 year old female with past history of hypertension, hypercholesterolemia and hypothyroidism who comes in today for a scheduled follow up.  She states she is doing well.   She watches her diet and exercises regularly.   No cardiac symptoms with increased activity or exertion.   Overall feels good.  She retired last year.  Sees Dr Vernie Ammons for her gyn care.  States she is up to date. Has noticed a couple of episodes of feeling light headed (over the last few days).  No increased sinus congestion or drainage.  Only last for a brief period then resolves.  Has been working a lot of hours and feels may be related to this.  No symptoms today.  No headache now.  Had minimal headache two days ago.  Did not last.  No vision change.  No other symptoms.      Past Medical History  Diagnosis Date  . Hypertension   . Hypercholesterolemia   . Hypothyroidism   . Melanoma   . Patella fracture     right    Current Outpatient Prescriptions on File Prior to Visit  Medication Sig Dispense Refill  . atorvastatin (LIPITOR) 10 MG tablet TAKE 1 TABLET BY MOUTH ONCE A DAY (Patient taking differently: TAKE 1 TABLET BY MOUTH on TUES, THURS, & SUNDAY) 30 tablet 5  . levothyroxine (SYNTHROID, LEVOTHROID) 75 MCG tablet TAKE 1 TABLET BY MOUTH EVERY DAY 30 tablet 4  . lisinopril (PRINIVIL,ZESTRIL) 10 MG tablet TAKE 1/2 TABLET BY MOUTH DAILY 15 tablet 4  . metoprolol tartrate (LOPRESSOR) 25 MG tablet TAKE 1/2 TABLET BY MOUTH DAILY 30 tablet 4  . Multiple Vitamin (MULTIVITAMIN) tablet Take 1 tablet by mouth daily.    Marland Kitchen zolpidem (AMBIEN) 10 MG tablet 1/2 tablet q hs prn 15 tablet 0   No current facility-administered medications on file prior to visit.    Review of Systems Patient denies any headache, lightheadedness or dizziness currently.  Did have a couple of episodes of feeling light headed as outlined above.   No  sinus or allergy symptoms.  No chest pain, tightness or palpitations currently.   Palpitations have improved.  Not an issue for her now.  No increased shortness of breath, cough or congestion.  No nausea or vomiting.  No acid reflux.  No abdominal pain or cramping. No bowel change, such as diarrhea, constipation, BRBPR or melana.  No urine change.  Stays very physically active.  No cardiac symptoms with increased activity or exertion.  Overall feels good.       Objective:   Physical Exam  Filed Vitals:   10/10/14 0804  BP: 118/80  Pulse: 80  Temp: 97.1 F (36.2 C)   Blood pressure recheck: 118/80 lying and 108/78 standing.   53 year old female in no acute distress.   HEENT:  Nares - clear.  OP- without lesions or erythema.  NECK:  Supple, nontender.  No audible bruit.   HEART:  Appears to be regular. LUNGS:  Without crackles or wheezing audible.  Respirations even and unlabored.   RADIAL PULSE:  Equal bilaterally.  ABDOMEN:  Soft, nontender.  No audible abdominal bruit.   EXTREMITIES:  No increased edema to be present.                  Assessment & Plan:  1. Essential hypertension Blood pressure as  outlined.  Stop lisinopril.  Follow pressures.  Get her back in soon to reassess.   - Basic metabolic panel; Future  2. Hypothyroidism, unspecified hypothyroidism type Continues on thyroid replacement.  - TSH; Future  3. Hypercholesterolemia Low cholesterol diet and exercise.   - Lipid panel; Future - Hepatic function panel; Future  4. Light headedness No symptoms currently.  Stop lisinopril as outlined.  Check routine labs as outlined.  Follow pressures.  Call with update next week.   - CBC with Differential; Future  5. CARDIOVASCULAR.  Had increased heart racing previously.  Saw Dr Saralyn Pilar.  Had stress test and holter monitor. Lisinopril decreased.  Currently asymptomatic.  Stays physically active.   6. POSSIBLE RAYNAUD'S.  Stable.    HEALTH MAINTENANCE.  Breasts, pelvics  and paps are done through Dr Laury Axon office.  Mammograms are done through there as well.  Obtain records for this year.  Is up to date.  Colonoscopy 07/18/13 - cecal polyp and divertiuclosis.     Addendum:  Pap 01/08/12 ([facility]) - negative with negative HPV.    I spent 25 minutes with the patient and more than 50% of the time was spent in consultation regarding the above.

## 2014-10-10 NOTE — Patient Instructions (Signed)
Stop the lisinopril

## 2014-10-10 NOTE — Progress Notes (Signed)
Pre visit review using our clinic review tool, if applicable. No additional management support is needed unless otherwise documented below in the visit note. 

## 2014-10-11 ENCOUNTER — Encounter: Payer: Self-pay | Admitting: Internal Medicine

## 2014-10-16 ENCOUNTER — Encounter: Payer: Self-pay | Admitting: Internal Medicine

## 2014-10-17 ENCOUNTER — Other Ambulatory Visit: Payer: Self-pay | Admitting: Internal Medicine

## 2014-10-17 NOTE — Telephone Encounter (Signed)
Last OV 10/10/14 ok to fill?

## 2014-10-17 NOTE — Telephone Encounter (Signed)
rx ok'd for ambien #15 with no refills.   

## 2014-11-09 ENCOUNTER — Other Ambulatory Visit: Payer: Self-pay | Admitting: Internal Medicine

## 2014-11-18 ENCOUNTER — Other Ambulatory Visit: Payer: Self-pay | Admitting: Internal Medicine

## 2014-11-23 ENCOUNTER — Other Ambulatory Visit: Payer: Self-pay | Admitting: Internal Medicine

## 2014-11-24 ENCOUNTER — Other Ambulatory Visit: Payer: Self-pay | Admitting: Internal Medicine

## 2014-12-19 ENCOUNTER — Ambulatory Visit (INDEPENDENT_AMBULATORY_CARE_PROVIDER_SITE_OTHER): Payer: BC Managed Care – PPO | Admitting: Internal Medicine

## 2014-12-19 ENCOUNTER — Encounter: Payer: Self-pay | Admitting: Internal Medicine

## 2014-12-19 VITALS — BP 120/80 | HR 71 | Temp 98.0°F | Ht 64.0 in | Wt 134.5 lb

## 2014-12-19 DIAGNOSIS — Z86010 Personal history of colonic polyps: Secondary | ICD-10-CM

## 2014-12-19 DIAGNOSIS — I10 Essential (primary) hypertension: Secondary | ICD-10-CM

## 2014-12-19 DIAGNOSIS — C439 Malignant melanoma of skin, unspecified: Secondary | ICD-10-CM

## 2014-12-19 DIAGNOSIS — E039 Hypothyroidism, unspecified: Secondary | ICD-10-CM

## 2014-12-19 DIAGNOSIS — E7800 Pure hypercholesterolemia, unspecified: Secondary | ICD-10-CM

## 2014-12-19 NOTE — Progress Notes (Signed)
Pre visit review using our clinic review tool, if applicable. No additional management support is needed unless otherwise documented below in the visit note. 

## 2014-12-20 ENCOUNTER — Encounter: Payer: Self-pay | Admitting: Internal Medicine

## 2014-12-24 ENCOUNTER — Encounter: Payer: Self-pay | Admitting: Internal Medicine

## 2014-12-24 NOTE — Progress Notes (Signed)
Patient ID: [name], female   DOB: December 27, 1960, 54 y.o.   MRN: [id number]   Subjective:    Patient ID: [name], female    DOB: 08-Jun-1961, 54 y.o.   MRN: [id number]  HPI  Patient here for a scheduled follow up.  States she feels good.  Continues to exercise.  No cardiac symptoms with increased activity or exertion.  Breathing doing well.  Has been seeing gyn.  Wants to start getting her physicals here.     Past Medical History  Diagnosis Date  . Hypertension   . Hypercholesterolemia   . Hypothyroidism   . Melanoma   . Patella fracture     right    Current Outpatient Prescriptions on File Prior to Visit  Medication Sig Dispense Refill  . atorvastatin (LIPITOR) 10 MG tablet TAKE 1 TABLET BY MOUTH ONCE A DAY (Patient taking differently: TAKE 1 TABLET BY MOUTH on TUES, THURS, & SUNDAY) 30 tablet 5  . levothyroxine (SYNTHROID, LEVOTHROID) 75 MCG tablet TAKE 1 TABLET BY MOUTH EVERY DAY 30 tablet 11  . metoprolol tartrate (LOPRESSOR) 25 MG tablet TAKE 1/2 TABLET BY MOUTH DAILY 30 tablet 4  . Multiple Vitamin (MULTIVITAMIN) tablet Take 1 tablet by mouth daily.    Marland Kitchen zolpidem (AMBIEN) 10 MG tablet TAKE 1/2 TABLET AT BEDTIME AS NEEDED 15 tablet 0   No current facility-administered medications on file prior to visit.    Review of Systems  Constitutional: Negative for appetite change, fatigue and unexpected weight change.  HENT: Negative for congestion and sinus pressure.   Respiratory: Negative for cough, chest tightness and shortness of breath.   Cardiovascular: Negative for chest pain and palpitations.  Gastrointestinal: Negative for nausea, vomiting, abdominal pain and diarrhea.  Musculoskeletal: Negative for back pain and joint swelling.  Neurological: Negative for dizziness, light-headedness and headaches.       Objective:     Blood pressure recheck:  124/70  Physical Exam  Constitutional: She appears well-developed and well-nourished. No distress.  HENT:    Nose: Nose normal.  Mouth/Throat: Oropharynx is clear and moist.  Neck: Neck supple. No thyromegaly present.  Cardiovascular: Normal rate and regular rhythm.   Pulmonary/Chest: Breath sounds normal. No respiratory distress. She has no wheezes.  Abdominal: Soft. Bowel sounds are normal. There is no tenderness.  Musculoskeletal: She exhibits no edema or tenderness.  Lymphadenopathy:    She has no cervical adenopathy.    BP 120/80 mmHg  Pulse 71  Temp(Src) 98 F (36.7 C) (Oral)  Ht 5\' 4"  (1.626 m)  Wt 134 lb 8 oz (61.009 kg)  BMI 23.08 kg/m2  SpO2 95% Wt Readings from Last 3 Encounters:  12/19/14 134 lb 8 oz (61.009 kg)  10/10/14 130 lb 4 oz (59.081 kg)  04/10/14 128 lb 12 oz (58.401 kg)     Lab Results  Component Value Date   WBC 5.9 10/10/2014   HGB 13.4 10/10/2014   HCT 40.4 10/10/2014   PLT 262.0 10/10/2014   GLUCOSE 101* 10/10/2014   CHOL 228* 10/10/2014   TRIG 239.0* 10/10/2014   HDL 44.80 10/10/2014   LDLDIRECT 125.7 10/10/2014   LDLCALC 110* 05/05/2014   ALT 23 10/10/2014   AST 27 10/10/2014   NA 138 10/10/2014   K 4.7 10/10/2014   CL 105 10/10/2014   CREATININE 0.8 10/10/2014   BUN 14 10/10/2014   CO2 26 10/10/2014   TSH 1.14 10/10/2014       Assessment & Plan:   Problem List  Items Addressed This Visit    History of colonic polyps    Colonoscopy 07/18/13 as outlined.  Recommended f/u colonoscopy in 06/2023.        Hypercholesterolemia    Low cholesterol diet and exercise.  On lipitor.  Follow lipid panel and liver function tests.        Hypertension - Primary    Blood pressure doing well.  Follow pressures.  Same medication regimen.  Follow metabolic panel.        Hypothyroidism    On thyroid replacement.  Follow tsh.        Melanoma    Seeing Dr Tyler Deis.  Up to date.  Follow.            Einar Pheasant, MD

## 2014-12-24 NOTE — Assessment & Plan Note (Signed)
Seeing Dr Tyler Deis.  Up to date.  Follow.

## 2014-12-24 NOTE — Assessment & Plan Note (Signed)
Blood pressure doing well.  Follow pressures.  Same medication regimen.  Follow metabolic panel.

## 2014-12-24 NOTE — Assessment & Plan Note (Signed)
Colonoscopy 07/18/13 as outlined.  Recommended f/u colonoscopy in 06/2023.

## 2014-12-24 NOTE — Assessment & Plan Note (Signed)
Low cholesterol diet and exercise.  On lipitor.  Follow lipid panel and liver function tests.   

## 2014-12-24 NOTE — Assessment & Plan Note (Signed)
On thyroid replacement.  Follow tsh.  

## 2015-03-19 ENCOUNTER — Encounter: Payer: Self-pay | Admitting: Internal Medicine

## 2015-03-21 ENCOUNTER — Ambulatory Visit (INDEPENDENT_AMBULATORY_CARE_PROVIDER_SITE_OTHER): Payer: BC Managed Care – PPO | Admitting: Internal Medicine

## 2015-03-21 ENCOUNTER — Encounter: Payer: Self-pay | Admitting: Internal Medicine

## 2015-03-21 ENCOUNTER — Other Ambulatory Visit (HOSPITAL_COMMUNITY)
Admission: RE | Admit: 2015-03-21 | Discharge: 2015-03-21 | Disposition: A | Discharge status: Home / Self Care | Payer: BC Managed Care – PPO | Source: Ambulatory Visit | Attending: Internal Medicine | Admitting: Internal Medicine

## 2015-03-21 VITALS — BP 110/70 | HR 69 | Temp 98.1°F | Ht 63.75 in | Wt 131.2 lb

## 2015-03-21 DIAGNOSIS — I10 Essential (primary) hypertension: Secondary | ICD-10-CM

## 2015-03-21 DIAGNOSIS — E7800 Pure hypercholesterolemia, unspecified: Secondary | ICD-10-CM

## 2015-03-21 DIAGNOSIS — Z1151 Encounter for screening for human papillomavirus (HPV): Secondary | ICD-10-CM | POA: Insufficient documentation

## 2015-03-21 DIAGNOSIS — Z01419 Encounter for gynecological examination (general) (routine) without abnormal findings: Secondary | ICD-10-CM | POA: Diagnosis not present

## 2015-03-21 DIAGNOSIS — E039 Hypothyroidism, unspecified: Secondary | ICD-10-CM | POA: Diagnosis not present

## 2015-03-21 DIAGNOSIS — M8580 Other specified disorders of bone density and structure, unspecified site: Secondary | ICD-10-CM

## 2015-03-21 DIAGNOSIS — Z0000 Encounter for general adult medical examination without abnormal findings: Secondary | ICD-10-CM

## 2015-03-21 DIAGNOSIS — Z1239 Encounter for other screening for malignant neoplasm of breast: Secondary | ICD-10-CM

## 2015-03-21 DIAGNOSIS — Z86010 Personal history of colonic polyps: Secondary | ICD-10-CM

## 2015-03-21 DIAGNOSIS — C439 Malignant melanoma of skin, unspecified: Secondary | ICD-10-CM

## 2015-03-21 LAB — HEPATIC FUNCTION PANEL
ALT: 30 U/L (ref 0–35)
AST: 28 U/L (ref 0–37)
Albumin: 4.5 g/dL (ref 3.5–5.2)
Alkaline Phosphatase: 68 U/L (ref 39–117)
Bilirubin, Direct: 0 mg/dL (ref 0.0–0.3)
Total Bilirubin: 0.3 mg/dL (ref 0.2–1.2)
Total Protein: 7.7 g/dL (ref 6.0–8.3)

## 2015-03-21 LAB — BASIC METABOLIC PANEL
BUN: 12 mg/dL (ref 6–23)
CO2: 28 mEq/L (ref 19–32)
Calcium: 9.6 mg/dL (ref 8.4–10.5)
Chloride: 104 mEq/L (ref 96–112)
Creatinine, Ser: 0.85 mg/dL (ref 0.40–1.20)
GFR: 73.75 mL/min (ref >60.00)
Glucose, Bld: 94 mg/dL (ref 70–99)
Potassium: 4.7 mEq/L (ref 3.5–5.1)
Sodium: 137 mEq/L (ref 135–145)

## 2015-03-21 LAB — LIPID PANEL
Cholesterol: 240 mg/dL — ABNORMAL HIGH (ref 0–200)
HDL: 48.1 mg/dL (ref >39.00)
Total CHOL/HDL Ratio: 5
Triglycerides: 410 mg/dL — ABNORMAL HIGH (ref 0.0–149.0)

## 2015-03-21 LAB — TSH: TSH: 1.6 u[IU]/mL (ref 0.35–4.50)

## 2015-03-21 LAB — LDL CHOLESTEROL, DIRECT: Direct LDL: 104 mg/dL

## 2015-03-21 NOTE — Progress Notes (Signed)
Pre visit review using our clinic review tool, if applicable. No additional management support is needed unless otherwise documented below in the visit note. 

## 2015-03-21 NOTE — Progress Notes (Signed)
Patient ID: Kathryn Francis, female   DOB: December 01, 1960, 54 y.o.   MRN: [id number]   Subjective:    Patient ID: Kathryn Francis, female    DOB: Oct 17, 1961, 54 y.o.   MRN: [id number]  HPI  Patient here to follow up on her current medical issues as well as for a physical exam.  Stays active.  Exercises one hour per day.  Feels good.  A chain wrapped around her leg - several days ago.  No evidence of infection. Keeping covered.  No cardiac symptoms with increased activity or exertion.  Breathing stable.  Bowels stable.    Past Medical History  Diagnosis Date  . Hypertension   . Hypercholesterolemia   . Hypothyroidism   . Melanoma   . Patella fracture     right    Current Outpatient Prescriptions on File Prior to Visit  Medication Sig Dispense Refill  . atorvastatin (LIPITOR) 10 MG tablet TAKE 1 TABLET BY MOUTH ONCE A DAY (Patient taking differently: TAKE 1 TABLET BY MOUTH on TUES, THURS, & SUNDAY) 30 tablet 5  . levothyroxine (SYNTHROID, LEVOTHROID) 75 MCG tablet TAKE 1 TABLET BY MOUTH EVERY DAY 30 tablet 11  . metoprolol tartrate (LOPRESSOR) 25 MG tablet TAKE 1/2 TABLET BY MOUTH DAILY 30 tablet 4  . Multiple Vitamin (MULTIVITAMIN) tablet Take 1 tablet by mouth daily.    Marland Kitchen zolpidem (AMBIEN) 10 MG tablet TAKE 1/2 TABLET AT BEDTIME AS NEEDED 15 tablet 0   No current facility-administered medications on file prior to visit.    Review of Systems  Constitutional: Negative for appetite change and unexpected weight change.  HENT: Negative for congestion and sinus pressure.   Eyes: Negative for pain and visual disturbance.  Respiratory: Negative for cough, chest tightness and shortness of breath.   Cardiovascular: Negative for chest pain, palpitations and leg swelling.  Gastrointestinal: Negative for nausea, vomiting, abdominal pain and diarrhea.  Genitourinary: Negative for dysuria and difficulty urinating.  Musculoskeletal: Negative for back pain and joint swelling.  Skin: Negative for  color change and rash.  Neurological: Negative for dizziness and headaches.  Hematological: Negative for adenopathy. Does not bruise/bleed easily.  Psychiatric/Behavioral: Negative for dysphoric mood and agitation.       Objective:     Blood pressure recheck:  122/78  Physical Exam  Constitutional: She is oriented to person, place, and time. She appears well-developed and well-nourished.  HENT:  Nose: Nose normal.  Mouth/Throat: Oropharynx is clear and moist.  Eyes: Right eye exhibits no discharge. Left eye exhibits no discharge. No scleral icterus.  Neck: Neck supple. No thyromegaly present.  Cardiovascular: Normal rate and regular rhythm.   Pulmonary/Chest: Breath sounds normal. No accessory muscle usage. No tachypnea. No respiratory distress. She has no decreased breath sounds. She has no wheezes. She has no rhonchi. Right breast exhibits no inverted nipple, no mass, no nipple discharge and no tenderness (no axillary adenopathy). Left breast exhibits no inverted nipple, no mass, no nipple discharge and no tenderness (no axilarry adenopathy).  Abdominal: Soft. Bowel sounds are normal. There is no tenderness.  Genitourinary:  Normal external genitalia.  Vaginal vault without lesions.  Cervix identified.  Pap smear performed.  Could not appreciate any adnexal masses or tenderness.    Musculoskeletal: She exhibits no edema or tenderness.  Lymphadenopathy:    She has no cervical adenopathy.  Neurological: She is alert and oriented to person, place, and time.  Skin: Skin is warm. No rash noted.  Psychiatric: She has a normal  mood and affect. Her behavior is normal.    BP 110/70 mmHg  Pulse 69  Temp(Src) 98.1 F (36.7 C) (Oral)  Ht 5' 3.75" (1.619 m)  Wt 131 lb 4 oz (59.535 kg)  BMI 22.71 kg/m2  SpO2 98% Wt Readings from Last 3 Encounters:  03/21/15 131 lb 4 oz (59.535 kg)  12/19/14 134 lb 8 oz (61.009 kg)  10/10/14 130 lb 4 oz (59.081 kg)     Lab Results  Component Value  Date   WBC 5.9 10/10/2014   HGB 13.4 10/10/2014   HCT 40.4 10/10/2014   PLT 262.0 10/10/2014   GLUCOSE 94 03/21/2015   CHOL 240* 03/21/2015   TRIG * 03/21/2015    410.0 Triglyceride is over 400; calculations on Lipids are invalid.   HDL 48.10 03/21/2015   LDLDIRECT 104.0 03/21/2015   LDLCALC 110* 05/05/2014   ALT 30 03/21/2015   AST 28 03/21/2015   NA 137 03/21/2015   K 4.7 03/21/2015   CL 104 03/21/2015   CREATININE 0.85 03/21/2015   BUN 12 03/21/2015   CO2 28 03/21/2015   TSH 1.60 03/21/2015       Assessment & Plan:   Problem List Items Addressed This Visit    Health care maintenance    Colonoscopy 06/2013.  Recommend f/u colonoscopy in 2024.  PAP today - 03/21/15.  Schedule mammogram.  Physical 03/21/15.       History of colonic polyps    Colonoscopy 07/18/13.  Recommended f/u colonoscopy in 06/2023.        Hypercholesterolemia    Low cholesterol diet.  Continue exercise.  Follow lipid panel.       Relevant Orders   Lipid panel (Completed)   Hepatic function panel (Completed)   Cytology - PAP (Completed)   Hypertension - Primary    Blood pressure is under good control.  Only on metoprolol.  Follow pressures.  Check metabolic panel.       Relevant Orders   Basic metabolic panel (Completed)   Cytology - PAP (Completed)   Hypothyroidism    On thyroid replacement.  Follow tsh.       Relevant Orders   TSH (Completed)   Cytology - PAP (Completed)   Melanoma    Sees dermatology.        Osteopenia    Weight bearing exercise.  Check vitamin D level.         Other Visit Diagnoses    Breast cancer screening        Relevant Orders    MM DIGITAL SCREENING BILATERAL    Cytology - PAP (Completed)        Einar Pheasant, MD

## 2015-03-22 ENCOUNTER — Encounter: Payer: Self-pay | Admitting: Internal Medicine

## 2015-03-22 LAB — CYTOLOGY - PAP

## 2015-03-23 NOTE — Telephone Encounter (Signed)
Unread mychart message mailed to patient 

## 2015-03-25 ENCOUNTER — Encounter: Payer: Self-pay | Admitting: Internal Medicine

## 2015-03-25 DIAGNOSIS — Z0000 Encounter for general adult medical examination without abnormal findings: Secondary | ICD-10-CM | POA: Insufficient documentation

## 2015-03-25 NOTE — Assessment & Plan Note (Signed)
Colonoscopy 06/2013.  Recommend f/u colonoscopy in 2024.  PAP today - 03/21/15.  Schedule mammogram.  Physical 03/21/15.

## 2015-03-25 NOTE — Assessment & Plan Note (Signed)
Low cholesterol diet.  Continue exercise.  Follow lipid panel.

## 2015-03-25 NOTE — Assessment & Plan Note (Signed)
Colonoscopy 07/18/13.  Recommended f/u colonoscopy in 06/2023.

## 2015-03-25 NOTE — Assessment & Plan Note (Signed)
Weight bearing exercise.  Check vitamin D level.

## 2015-03-25 NOTE — Assessment & Plan Note (Signed)
On thyroid replacement.  Follow tsh.  

## 2015-03-25 NOTE — Assessment & Plan Note (Signed)
Blood pressure is under good control.  Only on metoprolol.  Follow pressures.  Check metabolic panel.

## 2015-03-25 NOTE — Assessment & Plan Note (Signed)
Sees dermatology.

## 2015-03-27 ENCOUNTER — Ambulatory Visit
Admission: RE | Admit: 2015-03-27 | Discharge: 2015-03-27 | Disposition: A | Discharge status: Home / Self Care | Payer: BC Managed Care – PPO | Source: Ambulatory Visit | Attending: Internal Medicine | Admitting: Internal Medicine

## 2015-03-27 DIAGNOSIS — Z1239 Encounter for other screening for malignant neoplasm of breast: Secondary | ICD-10-CM

## 2015-03-27 DIAGNOSIS — Z1231 Encounter for screening mammogram for malignant neoplasm of breast: Secondary | ICD-10-CM | POA: Insufficient documentation

## 2015-03-28 ENCOUNTER — Encounter: Payer: Self-pay | Admitting: Internal Medicine

## 2015-04-05 ENCOUNTER — Other Ambulatory Visit: Payer: Self-pay | Admitting: Internal Medicine

## 2015-04-06 NOTE — Telephone Encounter (Signed)
Last OV 6.1.16.  Please advise refill 

## 2015-04-06 NOTE — Telephone Encounter (Signed)
ok'd ambien #30 with no refills.   

## 2015-04-23 ENCOUNTER — Other Ambulatory Visit: Payer: Self-pay | Admitting: Internal Medicine

## 2015-04-26 ENCOUNTER — Other Ambulatory Visit: Payer: Self-pay | Admitting: Internal Medicine

## 2015-06-10 ENCOUNTER — Other Ambulatory Visit: Payer: Self-pay | Admitting: Internal Medicine

## 2015-07-27 ENCOUNTER — Encounter: Payer: Self-pay | Admitting: Internal Medicine

## 2015-07-30 ENCOUNTER — Encounter: Payer: Self-pay | Admitting: Internal Medicine

## 2015-07-30 ENCOUNTER — Ambulatory Visit (INDEPENDENT_AMBULATORY_CARE_PROVIDER_SITE_OTHER): Payer: BC Managed Care – PPO | Admitting: Internal Medicine

## 2015-07-30 VITALS — BP 110/70 | HR 68 | Temp 97.7°F | Resp 18 | Ht 63.75 in | Wt 134.1 lb

## 2015-07-30 DIAGNOSIS — R2232 Localized swelling, mass and lump, left upper limb: Secondary | ICD-10-CM | POA: Diagnosis not present

## 2015-07-30 DIAGNOSIS — C439 Malignant melanoma of skin, unspecified: Secondary | ICD-10-CM

## 2015-07-30 NOTE — Progress Notes (Signed)
Pre-visit discussion using our clinic review tool. No additional management support is needed unless otherwise documented below in the visit note.  

## 2015-07-30 NOTE — Progress Notes (Signed)
Patient ID: Kathryn Francis, female   DOB: [birth year], 54 y.o.   MRN: [id number]   Subjective:    Patient ID: Kathryn Francis, female    DOB: January 28, 1961, 54 y.o.   MRN: [id number]  HPI  Patient here as a work in with concerns regarding a nodule she noticed under her left axilla.  First noticed last week.  Noticed some discomfort.  Thought was a "pimple".  Bigger over the next couple of days.  Some minimal tenderness.  No increased erythema or warmth.  No drainage.  No breast change.  Is smaller now with no intervention.  Has a history of melanoma.  With her history, she wanted it evaluated to confirm etiology.  No fever.  Eating and drinking well.  Feels good.  Still exercising.    Past Medical History  Diagnosis Date  . Hypertension   . Hypercholesterolemia   . Hypothyroidism   . Melanoma ([place])   . Patella fracture     right   Past Surgical History  Procedure Laterality Date  . Bunionectomy  1999, 2000   Family History  Problem Relation Age of Onset  . Hypertension      siblings  . Hypercholesterolemia      siblings  . Heart disease      parent  . Hypertension      parent   Social History   Social History  . Marital Status: Married    Spouse Name: N/A  . Number of Children: 2  . Years of Education: N/A   Social History Main Topics  . Smoking status: Never Smoker   . Smokeless tobacco: Never Used  . Alcohol Use: No  . Drug Use: No  . Sexual Activity: Not Asked   Other Topics Concern  . None   Social History Narrative    Outpatient Encounter Prescriptions as of 07/30/2015  Medication Sig  . atorvastatin (LIPITOR) 10 MG tablet TAKE 1 TABLET BY MOUTH ONCE A DAY  . levothyroxine (SYNTHROID, LEVOTHROID) 75 MCG tablet TAKE 1 TABLET BY MOUTH EVERY DAY  . metoprolol tartrate (LOPRESSOR) 25 MG tablet TAKE 1/2 TABLET BY MOUTH DAILY  . Multiple Vitamin (MULTIVITAMIN) tablet Take 1 tablet by mouth daily.  Marland Kitchen zolpidem (AMBIEN) 5 MG tablet TAKE 1 TAB BY MOUTH AT BEDTIME  AS NEEDED  . [DISCONTINUED] zolpidem (AMBIEN) 10 MG tablet TAKE 1/2 TABLET AT BEDTIME AS NEEDED   No facility-administered encounter medications on file as of 07/30/2015.    Review of Systems  Constitutional: Negative for fever and appetite change.  HENT: Negative for congestion and sinus pressure.   Respiratory: Negative for cough and shortness of breath.   Cardiovascular: Negative for chest pain and palpitations.  Gastrointestinal: Negative for nausea and vomiting.  Skin: Negative for color change and rash.       Objective:    Physical Exam  Constitutional: She appears well-developed and well-nourished. No distress.  Neck: Neck supple.  Cardiovascular: Normal rate and regular rhythm.   Pulmonary/Chest: Breath sounds normal. No respiratory distress. She has no wheezes.  Left axilla with small palpable tissue fullness (approximately 1.5 x .5cm).  Superficial.  No significant tenderness.  No erythema.  No there masses or nodules appreciated.  Right axilla - no palpable lesion.   Abdominal: Soft. Bowel sounds are normal. There is no tenderness.  Lymphadenopathy:    She has no cervical adenopathy.    BP 110/70 mmHg  Pulse 68  Temp(Src) 97.7 F (36.5 C) (Oral)  Resp 18  Ht 5' 3.75" (1.619 m)  Wt 134 lb 2 oz (60.839 kg)  BMI 23.21 kg/m2  SpO2 97% Wt Readings from Last 3 Encounters:  07/30/15 134 lb 2 oz (60.839 kg)  03/21/15 131 lb 4 oz (59.535 kg)  12/19/14 134 lb 8 oz (61.009 kg)     Lab Results  Component Value Date   WBC 5.9 10/10/2014   HGB 13.4 10/10/2014   HCT 40.4 10/10/2014   PLT 262.0 10/10/2014   GLUCOSE 94 03/21/2015   CHOL 240* 03/21/2015   TRIG * 03/21/2015    410.0 Triglyceride is over 400; calculations on Lipids are invalid.   HDL 48.10 03/21/2015   LDLDIRECT 104.0 03/21/2015   LDLCALC 110* 05/05/2014   ALT 30 03/21/2015   AST 28 03/21/2015   NA 137 03/21/2015   K 4.7 03/21/2015   CL 104 03/21/2015   CREATININE 0.85 03/21/2015   BUN 12  03/21/2015   CO2 28 03/21/2015   TSH 1.60 03/21/2015    Mm Digital Screening Bilateral  03/27/2015   CLINICAL DATA:  Screening.  EXAM: DIGITAL SCREENING BILATERAL MAMMOGRAM WITH CAD  COMPARISON:  Previous exam(s).  ACR Breast Density Category c: The breast tissue is heterogeneously dense, which may obscure small masses.  FINDINGS: There are no findings suspicious for malignancy. Images were processed with CAD.  IMPRESSION: No mammographic evidence of malignancy. A result letter of this screening mammogram will be mailed directly to the patient.  RECOMMENDATION: Screening mammogram in one year. (Code:SM-B-01Y)  BI-RADS CATEGORY  1: Negative.   Electronically Signed   By: Everlean Alstrom M.D.   On: 03/27/2015 14:49       Assessment & Plan:   Problem List Items Addressed This Visit    Lump of axilla - Primary    Persistent.  Exam as outlined.  No unusual lymphadenopathy.  No drainage or evidence of infection.  Pt concerned because of history of melanoma.  After discussion, will refer to Dr Bary Castilla for further evaluation and question of need for removal.        Relevant Orders   Ambulatory referral to General Surgery   Melanoma [facility])    Followed by dermatology.           Einar Pheasant, MD

## 2015-07-31 ENCOUNTER — Encounter: Payer: Self-pay | Admitting: Internal Medicine

## 2015-07-31 ENCOUNTER — Encounter: Payer: Self-pay | Admitting: General Surgery

## 2015-07-31 DIAGNOSIS — R2230 Localized swelling, mass and lump, unspecified upper limb: Secondary | ICD-10-CM | POA: Insufficient documentation

## 2015-07-31 NOTE — Assessment & Plan Note (Signed)
Persistent.  Exam as outlined.  No unusual lymphadenopathy.  No drainage or evidence of infection.  Pt concerned because of history of melanoma.  After discussion, will refer to Dr Bary Castilla for further evaluation and question of need for removal.

## 2015-07-31 NOTE — Assessment & Plan Note (Signed)
Followed by dermatology

## 2015-08-17 ENCOUNTER — Encounter: Payer: Self-pay | Admitting: Internal Medicine

## 2015-08-20 ENCOUNTER — Telehealth: Payer: Self-pay | Admitting: Internal Medicine

## 2015-08-20 NOTE — Telephone Encounter (Signed)
Labs printed from June 2016.  Left at [facility] for pick up.

## 2015-08-20 NOTE — Telephone Encounter (Signed)
Pt called to see if she can get a copy of her latest lab work. Pt can pick up please, call once it's ready. Pt needs for her insurance purpose.Thank you!

## 2015-08-24 ENCOUNTER — Other Ambulatory Visit: Payer: Self-pay | Admitting: Internal Medicine

## 2015-08-24 NOTE — Telephone Encounter (Signed)
ok'd refill for ambien #30 with no refills.  rx signed and placed on your box.

## 2015-08-24 NOTE — Telephone Encounter (Signed)
Ambien refill does not exceed 5 since start date.  Seen by PCP less than 30 days ago.

## 2015-08-28 ENCOUNTER — Ambulatory Visit: Payer: Self-pay | Admitting: General Surgery

## 2015-09-20 ENCOUNTER — Encounter: Payer: Self-pay | Admitting: Internal Medicine

## 2015-09-20 ENCOUNTER — Ambulatory Visit (INDEPENDENT_AMBULATORY_CARE_PROVIDER_SITE_OTHER): Payer: BC Managed Care – PPO | Admitting: Internal Medicine

## 2015-09-20 VITALS — BP 110/80 | HR 69 | Temp 98.1°F | Resp 18 | Ht 63.75 in | Wt 132.0 lb

## 2015-09-20 DIAGNOSIS — E039 Hypothyroidism, unspecified: Secondary | ICD-10-CM

## 2015-09-20 DIAGNOSIS — E7800 Pure hypercholesterolemia, unspecified: Secondary | ICD-10-CM | POA: Diagnosis not present

## 2015-09-20 DIAGNOSIS — Z86010 Personal history of colonic polyps: Secondary | ICD-10-CM

## 2015-09-20 DIAGNOSIS — C439 Malignant melanoma of skin, unspecified: Secondary | ICD-10-CM

## 2015-09-20 DIAGNOSIS — I10 Essential (primary) hypertension: Secondary | ICD-10-CM | POA: Diagnosis not present

## 2015-09-20 LAB — CBC WITH DIFFERENTIAL/PLATELET
Basophils Absolute: 0 10*3/uL (ref 0.0–0.1)
Basophils Relative: 0.4 % (ref 0.0–3.0)
Eosinophils Absolute: 0.1 10*3/uL (ref 0.0–0.7)
Eosinophils Relative: 1.2 % (ref 0.0–5.0)
HCT: 40.2 % (ref 36.0–46.0)
Hemoglobin: 13.6 g/dL (ref 12.0–15.0)
Lymphocytes Relative: 39.4 % (ref 12.0–46.0)
Lymphs Abs: 1.8 10*3/uL (ref 0.7–4.0)
MCHC: 33.8 g/dL (ref 30.0–36.0)
MCV: 84.6 fl (ref 78.0–100.0)
Monocytes Absolute: 0.4 10*3/uL (ref 0.1–1.0)
Monocytes Relative: 9.3 % (ref 3.0–12.0)
Neutro Abs: 2.2 10*3/uL (ref 1.4–7.7)
Neutrophils Relative %: 49.7 % (ref 43.0–77.0)
Platelets: 245 10*3/uL (ref 150.0–400.0)
RBC: 4.76 Mil/uL (ref 3.87–5.11)
RDW: 12.8 % (ref 11.5–15.5)
WBC: 4.4 10*3/uL (ref 4.0–10.5)

## 2015-09-20 LAB — BASIC METABOLIC PANEL
BUN: 15 mg/dL (ref 6–23)
CO2: 29 mEq/L (ref 19–32)
Calcium: 9.6 mg/dL (ref 8.4–10.5)
Chloride: 105 mEq/L (ref 96–112)
Creatinine, Ser: 0.89 mg/dL (ref 0.40–1.20)
GFR: 69.81 mL/min (ref >60.00)
Glucose, Bld: 95 mg/dL (ref 70–99)
Potassium: 5 mEq/L (ref 3.5–5.1)
Sodium: 139 mEq/L (ref 135–145)

## 2015-09-20 LAB — HEPATIC FUNCTION PANEL
ALT: 23 U/L (ref 0–35)
AST: 23 U/L (ref 0–37)
Albumin: 4.4 g/dL (ref 3.5–5.2)
Alkaline Phosphatase: 68 U/L (ref 39–117)
Bilirubin, Direct: 0.1 mg/dL (ref 0.0–0.3)
Total Bilirubin: 0.5 mg/dL (ref 0.2–1.2)
Total Protein: 7.5 g/dL (ref 6.0–8.3)

## 2015-09-20 LAB — LDL CHOLESTEROL, DIRECT: Direct LDL: 114 mg/dL

## 2015-09-20 LAB — LIPID PANEL
Cholesterol: 219 mg/dL — ABNORMAL HIGH (ref 0–200)
HDL: 45.3 mg/dL (ref >39.00)
NonHDL: 173.85
Total CHOL/HDL Ratio: 5
Triglycerides: 249 mg/dL — ABNORMAL HIGH (ref 0.0–149.0)
VLDL: 49.8 mg/dL — ABNORMAL HIGH (ref 0.0–40.0)

## 2015-09-20 LAB — TSH: TSH: 1.15 u[IU]/mL (ref 0.35–4.50)

## 2015-09-20 NOTE — Assessment & Plan Note (Signed)
On thyroid replacement.  Check tsh today.   

## 2015-09-20 NOTE — Assessment & Plan Note (Signed)
Followed by dermatology

## 2015-09-20 NOTE — Progress Notes (Signed)
Patient ID: Kathryn Francis, female   DOB: 1961/04/05, 54 y.o.   MRN: [id number]   Subjective:    Patient ID: Kathryn Francis, female    DOB: 1961/01/15, 54 y.o.   MRN: [id number]  HPI  Patient with past history of hypercholesterolemia, hypertension and hypothyroidism.  She comes in today for a scheduled follow up.  She is doing well.  Exercising.  No cardiac symptoms with increased activity or exertion.  No sob.  No abdominal pain or cramping reported.  Bowels doing ok.    Past Medical History  Diagnosis Date  . Hypertension   . Hypercholesterolemia   . Hypothyroidism   . Melanoma ([place])   . Patella fracture     right   Past Surgical History  Procedure Laterality Date  . Bunionectomy  1999, 2000   Family History  Problem Relation Age of Onset  . Hypertension      siblings  . Hypercholesterolemia      siblings  . Heart disease      parent  . Hypertension      parent   Social History   Social History  . Marital Status: Married    Spouse Name: N/A  . Number of Children: 2  . Years of Education: N/A   Social History Main Topics  . Smoking status: Never Smoker   . Smokeless tobacco: Never Used  . Alcohol Use: No  . Drug Use: No  . Sexual Activity: Not Asked   Other Topics Concern  . None   Social History Narrative    Outpatient Encounter Prescriptions as of 09/20/2015  Medication Sig  . atorvastatin (LIPITOR) 10 MG tablet TAKE 1 TABLET BY MOUTH ONCE A DAY  . levothyroxine (SYNTHROID, LEVOTHROID) 75 MCG tablet TAKE 1 TABLET BY MOUTH EVERY DAY  . metoprolol tartrate (LOPRESSOR) 25 MG tablet TAKE 1/2 TABLET BY MOUTH DAILY  . Multiple Vitamin (MULTIVITAMIN) tablet Take 1 tablet by mouth daily.  Marland Kitchen zolpidem (AMBIEN) 5 MG tablet TAKE 1 TABLET BY MOUTH AT BEDTIME AS NEEDED   No facility-administered encounter medications on file as of 09/20/2015.    Review of Systems  Constitutional: Negative for appetite change and unexpected weight change.  HENT: Negative for  congestion and sinus pressure.   Respiratory: Negative for cough, chest tightness and shortness of breath.   Cardiovascular: Negative for chest pain, palpitations and leg swelling.  Gastrointestinal: Negative for nausea, vomiting, abdominal pain and diarrhea.  Skin: Negative for color change and rash.  Neurological: Negative for light-headedness and headaches.  Psychiatric/Behavioral: Negative for dysphoric mood and agitation.       Objective:     Blood pressure rechecked by me:  118/72  Physical Exam  Constitutional: She appears well-developed and well-nourished. No distress.  HENT:  Nose: Nose normal.  Mouth/Throat: Oropharynx is clear and moist.  Neck: Neck supple. No thyromegaly present.  Cardiovascular: Normal rate and regular rhythm.   Pulmonary/Chest: Breath sounds normal. No respiratory distress. She has no wheezes.  Abdominal: Soft. Bowel sounds are normal. There is no tenderness.  Musculoskeletal: She exhibits no edema or tenderness.  Lymphadenopathy:    She has no cervical adenopathy.  Skin: No rash noted. No erythema.  Psychiatric: She has a normal mood and affect. Her behavior is normal.    BP 110/80 mmHg  Pulse 69  Temp(Src) 98.1 F (36.7 C) (Oral)  Resp 18  Ht 5' 3.75" (1.619 m)  Wt 132 lb (59.875 kg)  BMI 22.84 kg/m2  SpO2 95%  Wt Readings from Last 3 Encounters:  09/20/15 132 lb (59.875 kg)  07/30/15 134 lb 2 oz (60.839 kg)  03/21/15 131 lb 4 oz (59.535 kg)     Lab Results  Component Value Date   WBC 5.9 10/10/2014   HGB 13.4 10/10/2014   HCT 40.4 10/10/2014   PLT 262.0 10/10/2014   GLUCOSE 94 03/21/2015   CHOL 240* 03/21/2015   TRIG * 03/21/2015    410.0 Triglyceride is over 400; calculations on Lipids are invalid.   HDL 48.10 03/21/2015   LDLDIRECT 104.0 03/21/2015   LDLCALC 110* 05/05/2014   ALT 30 03/21/2015   AST 28 03/21/2015   NA 137 03/21/2015   K 4.7 03/21/2015   CL 104 03/21/2015   CREATININE 0.85 03/21/2015   BUN 12  03/21/2015   CO2 28 03/21/2015   TSH 1.60 03/21/2015    Mm Digital Screening Bilateral  03/27/2015  CLINICAL DATA:  Screening. EXAM: DIGITAL SCREENING BILATERAL MAMMOGRAM WITH CAD COMPARISON:  Previous exam(s). ACR Breast Density Category c: The breast tissue is heterogeneously dense, which may obscure small masses. FINDINGS: There are no findings suspicious for malignancy. Images were processed with CAD. IMPRESSION: No mammographic evidence of malignancy. A result letter of this screening mammogram will be mailed directly to the patient. RECOMMENDATION: Screening mammogram in one year. (Code:SM-B-01Y) BI-RADS CATEGORY  1: Negative. Electronically Signed   By: Everlean Alstrom M.D.   On: 03/27/2015 14:49       Assessment & Plan:   Problem List Items Addressed This Visit    History of colonic polyps    Colonoscopy 07/18/13.  Recommended f/u colonoscopy 06/2023.       Hypercholesterolemia    Triglycerides elevated last check.  Low cholesterol diet and exercise.  Check lipid panel.  On lipitor.  Check liver panel.       Relevant Orders   Lipid panel   Hepatic function panel   Hypertension - Primary    Blood pressure under good control.  Continue same medication regimen.  Follow pressures.  Follow metabolic panel.        Relevant Orders   CBC with Differential/Platelet   Basic metabolic panel   Hypothyroidism    On thyroid replacement.  Check tsh today.       Relevant Orders   TSH   Melanoma ([place])    Followed by dermatology.           Einar Pheasant, MD

## 2015-09-20 NOTE — Assessment & Plan Note (Signed)
Blood pressure under good control.  Continue same medication regimen.  Follow pressures.  Follow metabolic panel.   

## 2015-09-20 NOTE — Progress Notes (Signed)
Pre-visit discussion using our clinic review tool. No additional management support is needed unless otherwise documented below in the visit note.  

## 2015-09-20 NOTE — Assessment & Plan Note (Signed)
Triglycerides elevated last check.  Low cholesterol diet and exercise.  Check lipid panel.  On lipitor.  Check liver panel.

## 2015-09-20 NOTE — Assessment & Plan Note (Signed)
Colonoscopy 07/18/13.  Recommended f/u colonoscopy 06/2023.

## 2015-09-21 ENCOUNTER — Encounter: Payer: Self-pay | Admitting: Internal Medicine

## 2015-10-04 ENCOUNTER — Encounter: Payer: Self-pay | Admitting: Internal Medicine

## 2015-12-01 ENCOUNTER — Other Ambulatory Visit: Payer: Self-pay | Admitting: Internal Medicine

## 2015-12-22 ENCOUNTER — Other Ambulatory Visit: Payer: Self-pay | Admitting: Internal Medicine

## 2015-12-24 NOTE — Telephone Encounter (Signed)
Okay to refill? Last OV: 09/20/15 Next OV: 03/25/16

## 2015-12-25 NOTE — Telephone Encounter (Signed)
ok'd refill ambien #30 with no refills.   

## 2015-12-25 NOTE — Telephone Encounter (Signed)
Rx faxed to [facility] [address] 

## 2016-03-25 ENCOUNTER — Encounter: Payer: Self-pay | Admitting: Internal Medicine

## 2016-03-25 ENCOUNTER — Ambulatory Visit (INDEPENDENT_AMBULATORY_CARE_PROVIDER_SITE_OTHER): Payer: BC Managed Care – PPO | Admitting: Internal Medicine

## 2016-03-25 VITALS — BP 110/80 | HR 64 | Temp 97.9°F | Ht 64.0 in | Wt 128.0 lb

## 2016-03-25 DIAGNOSIS — Z860100 Personal history of colon polyps, unspecified: Secondary | ICD-10-CM

## 2016-03-25 DIAGNOSIS — I10 Essential (primary) hypertension: Secondary | ICD-10-CM

## 2016-03-25 DIAGNOSIS — R21 Rash and other nonspecific skin eruption: Secondary | ICD-10-CM

## 2016-03-25 DIAGNOSIS — Z0000 Encounter for general adult medical examination without abnormal findings: Secondary | ICD-10-CM | POA: Diagnosis not present

## 2016-03-25 DIAGNOSIS — C439 Malignant melanoma of skin, unspecified: Secondary | ICD-10-CM

## 2016-03-25 DIAGNOSIS — E7800 Pure hypercholesterolemia, unspecified: Secondary | ICD-10-CM | POA: Diagnosis not present

## 2016-03-25 DIAGNOSIS — E039 Hypothyroidism, unspecified: Secondary | ICD-10-CM

## 2016-03-25 LAB — BASIC METABOLIC PANEL
BUN: 16 mg/dL (ref 6–23)
CO2: 27 mEq/L (ref 19–32)
Calcium: 9.6 mg/dL (ref 8.4–10.5)
Chloride: 105 mEq/L (ref 96–112)
Creatinine, Ser: 0.8 mg/dL (ref 0.40–1.20)
GFR: 78.81 mL/min (ref >60.00)
Glucose, Bld: 102 mg/dL — ABNORMAL HIGH (ref 70–99)
Potassium: 5 mEq/L (ref 3.5–5.1)
Sodium: 140 mEq/L (ref 135–145)

## 2016-03-25 LAB — LIPID PANEL
Cholesterol: 215 mg/dL — ABNORMAL HIGH (ref 0–200)
HDL: 45.7 mg/dL (ref >39.00)
NonHDL: 169.76
Total CHOL/HDL Ratio: 5
Triglycerides: 354 mg/dL — ABNORMAL HIGH (ref 0.0–149.0)
VLDL: 70.8 mg/dL — ABNORMAL HIGH (ref 0.0–40.0)

## 2016-03-25 LAB — HEPATIC FUNCTION PANEL
ALT: 27 U/L (ref 0–35)
AST: 25 U/L (ref 0–37)
Albumin: 4.7 g/dL (ref 3.5–5.2)
Alkaline Phosphatase: 70 U/L (ref 39–117)
Bilirubin, Direct: 0 mg/dL (ref 0.0–0.3)
Total Bilirubin: 0.5 mg/dL (ref 0.2–1.2)
Total Protein: 7.4 g/dL (ref 6.0–8.3)

## 2016-03-25 LAB — TSH: TSH: 1.3 u[IU]/mL (ref 0.35–4.50)

## 2016-03-25 LAB — LDL CHOLESTEROL, DIRECT: Direct LDL: 105 mg/dL

## 2016-03-25 MED ORDER — TRIAMCINOLONE ACETONIDE 0.1 % EX CREA: 1.0000 "application " | TOPICAL_CREAM | Freq: Two times a day (BID) | CUTANEOUS | Status: DC

## 2016-03-25 NOTE — Assessment & Plan Note (Signed)
Physical today 03/25/16.  PAP 03/21/15.  Colonoscopy 06/2013.  Recommended f/u colonoscopy in 2024.

## 2016-03-25 NOTE — Progress Notes (Signed)
Patient ID: Kathryn Francis, female   DOB: [birth year], 55 y.o.   MRN: [id number]   Subjective:    Patient ID: Kathryn Francis, female    DOB: 05/26/1961, 55 y.o.   MRN: [id number]  HPI  Patient here for a physical exam.   She is doing well.  No chest pain.  No sob.  No acid reflux.  No abdominal pain or cramping.  Bowels stable.  No urine change.  She does report itching under her breasts and neck and posterior scalp.  No rash under her breasts.  Minimal rash posterior scalp.  No other rash.  No fever.  States she noticed the itching and rash after [place].     Past Medical History  Diagnosis Date  . Hypertension   . Hypercholesterolemia   . Hypothyroidism   . Melanoma ([place])   . Patella fracture     right   Past Surgical History  Procedure Laterality Date  . Bunionectomy  1999, 2000   Family History  Problem Relation Age of Onset  . Hypertension      siblings  . Hypercholesterolemia      siblings  . Heart disease      parent  . Hypertension      parent  . Lupus      sister   Social History   Social History  . Marital Status: Married    Spouse Name: N/A  . Number of Children: 2  . Years of Education: N/A   Social History Main Topics  . Smoking status: Never Smoker   . Smokeless tobacco: Never Used  . Alcohol Use: No  . Drug Use: No  . Sexual Activity: Not Asked   Other Topics Concern  . None   Social History Narrative    Outpatient Encounter Prescriptions as of 03/25/2016  Medication Sig  . atorvastatin (LIPITOR) 10 MG tablet TAKE 1 TABLET BY MOUTH ONCE A DAY  . levothyroxine (SYNTHROID, LEVOTHROID) 75 MCG tablet TAKE 1 TABLET BY MOUTH EVERY DAY  . metoprolol tartrate (LOPRESSOR) 25 MG tablet TAKE 1/2 TABLET BY MOUTH DAILY  . Multiple Vitamin (MULTIVITAMIN) tablet Take 1 tablet by mouth daily.  Marland Kitchen triamcinolone cream (KENALOG) 0.1 % Apply 1 application topically 2 (two) times daily.  Marland Kitchen zolpidem (AMBIEN) 5 MG tablet TAKE 1 TABLET BY MOUTH AT  BEDTIME AS NEEDED   No facility-administered encounter medications on file as of 03/25/2016.    Review of Systems  Constitutional: Negative for appetite change and unexpected weight change.  HENT: Negative for congestion and sinus pressure.   Eyes: Negative for pain and visual disturbance.  Respiratory: Negative for cough, chest tightness and shortness of breath.   Cardiovascular: Negative for chest pain, palpitations and leg swelling.  Gastrointestinal: Negative for nausea, vomiting, abdominal pain and diarrhea.  Genitourinary: Negative for dysuria and difficulty urinating.  Musculoskeletal: Negative for back pain and joint swelling.  Skin: Positive for rash. Negative for color change.       Itching as outlined.   Neurological: Negative for dizziness, light-headedness and headaches.  Hematological: Negative for adenopathy. Does not bruise/bleed easily.  Psychiatric/Behavioral: Negative for dysphoric mood and agitation.       Objective:    Physical Exam  Constitutional: She is oriented to person, place, and time. She appears well-developed and well-nourished. No distress.  HENT:  Nose: Nose normal.  Mouth/Throat: Oropharynx is clear and moist.  Eyes: Right eye exhibits no discharge. Left eye exhibits no discharge.  No scleral icterus.  Neck: Neck supple. No thyromegaly present.  Cardiovascular: Normal rate and regular rhythm.   Pulmonary/Chest: Breath sounds normal. No accessory muscle usage. No tachypnea. No respiratory distress. She has no decreased breath sounds. She has no wheezes. She has no rhonchi. Right breast exhibits no inverted nipple, no mass, no nipple discharge and no tenderness (no axillary adenopathy). Left breast exhibits no inverted nipple, no mass, no nipple discharge and no tenderness (no axilarry adenopathy).  Abdominal: Soft. Bowel sounds are normal. There is no tenderness.  Musculoskeletal: She exhibits no edema or tenderness.  Lymphadenopathy:    She has no  cervical adenopathy.  Neurological: She is alert and oriented to person, place, and time.  Skin: Skin is warm. No erythema.  Few erythematous lesions - posterior scalp and upper posterior neck.  No lesions under breasts.    Psychiatric: She has a normal mood and affect. Her behavior is normal.    BP 110/80 mmHg  Pulse 64  Temp(Src) 97.9 F (36.6 C) (Oral)  Ht 5\' 4"  (1.626 m)  Wt 128 lb (58.06 kg)  BMI 21.96 kg/m2 Wt Readings from Last 3 Encounters:  03/25/16 128 lb (58.06 kg)  09/20/15 132 lb (59.875 kg)  07/30/15 134 lb 2 oz (60.839 kg)     Lab Results  Component Value Date   WBC 4.4 09/20/2015   HGB 13.6 09/20/2015   HCT 40.2 09/20/2015   PLT 245.0 09/20/2015   GLUCOSE 102* 03/25/2016   CHOL 215* 03/25/2016   TRIG 354.0* 03/25/2016   HDL 45.70 03/25/2016   LDLDIRECT 105.0 03/25/2016   LDLCALC 110* 05/05/2014   ALT 27 03/25/2016   AST 25 03/25/2016   NA 140 03/25/2016   K 5.0 03/25/2016   CL 105 03/25/2016   CREATININE 0.80 03/25/2016   BUN 16 03/25/2016   CO2 27 03/25/2016   TSH 1.30 03/25/2016    Mm Digital Screening Bilateral  03/27/2015  CLINICAL DATA:  Screening. EXAM: DIGITAL SCREENING BILATERAL MAMMOGRAM WITH CAD COMPARISON:  Previous exam(s). ACR Breast Density Category c: The breast tissue is heterogeneously dense, which may obscure small masses. FINDINGS: There are no findings suspicious for malignancy. Images were processed with CAD. IMPRESSION: No mammographic evidence of malignancy. A result letter of this screening mammogram will be mailed directly to the patient. RECOMMENDATION: Screening mammogram in one year. (Code:SM-B-01Y) BI-RADS CATEGORY  1: Negative. Electronically Signed   By: Everlean Alstrom M.D.   On: 03/27/2015 14:49       Assessment & Plan:   Problem List Items Addressed This Visit    Health care maintenance    Physical today 03/25/16.  PAP 03/21/15.  Colonoscopy 06/2013.  Recommended f/u colonoscopy in 2024.        History of colonic  polyps    Colonoscopy 07/18/13.  Recommended f/u colonoscopy in 06/2023.        Hypercholesterolemia    On lipitor.  Low cholesterol diet and exercise.  Follow lipid panel and liver function tests.        Relevant Orders   Lipid panel (Completed)   Hepatic function panel (Completed)   Hypertension    Blood pressure under good control.  Continue same medication regimen.  Follow pressures.  Follow metabolic panel.        Relevant Orders   Basic metabolic panel (Completed)   Hypothyroidism    On thyroid replacement.  Follow tsh.        Relevant Orders   TSH (Completed)   Melanoma (  [place])    Followed by dermatology.        Rash    Itching as outlined.  Minimal rash.  TCC as directed.  Zyrtec.  Follow. Notify me if persistent.  May need dermatology referral.         Other Visit Diagnoses    Routine general medical examination at a health care facility    -  Primary        Einar Pheasant, MD

## 2016-03-25 NOTE — Progress Notes (Signed)
Pre-visit discussion using our clinic review tool. No additional management support is needed unless otherwise documented below in the visit note.  

## 2016-03-28 ENCOUNTER — Encounter: Payer: Self-pay | Admitting: Internal Medicine

## 2016-03-30 ENCOUNTER — Encounter: Payer: Self-pay | Admitting: Internal Medicine

## 2016-03-30 DIAGNOSIS — R21 Rash and other nonspecific skin eruption: Secondary | ICD-10-CM | POA: Insufficient documentation

## 2016-03-30 NOTE — Assessment & Plan Note (Signed)
On thyroid replacement.  Follow tsh.  

## 2016-03-30 NOTE — Assessment & Plan Note (Signed)
Colonoscopy 07/18/13.  Recommended f/u colonoscopy in 06/2023.   

## 2016-03-30 NOTE — Assessment & Plan Note (Signed)
Followed by dermatology

## 2016-03-30 NOTE — Assessment & Plan Note (Signed)
Blood pressure under good control.  Continue same medication regimen.  Follow pressures.  Follow metabolic panel.   

## 2016-03-30 NOTE — Assessment & Plan Note (Signed)
Itching as outlined.  Minimal rash.  TCC as directed.  Zyrtec.  Follow. Notify me if persistent.  May need dermatology referral.

## 2016-03-30 NOTE — Assessment & Plan Note (Signed)
On lipitor.  Low cholesterol diet and exercise.  Follow lipid panel and liver function tests.   

## 2016-04-03 ENCOUNTER — Other Ambulatory Visit: Payer: Self-pay | Admitting: Internal Medicine

## 2016-04-21 IMAGING — MG MM DIGITAL SCREENING BILATERAL
4 series · 4 of 4 positions shown · non-contrast
Comparison: Previous exam(s).

CLINICAL DATA: Screening.

EXAM:
DIGITAL SCREENING BILATERAL MAMMOGRAM WITH CAD

[L CC]
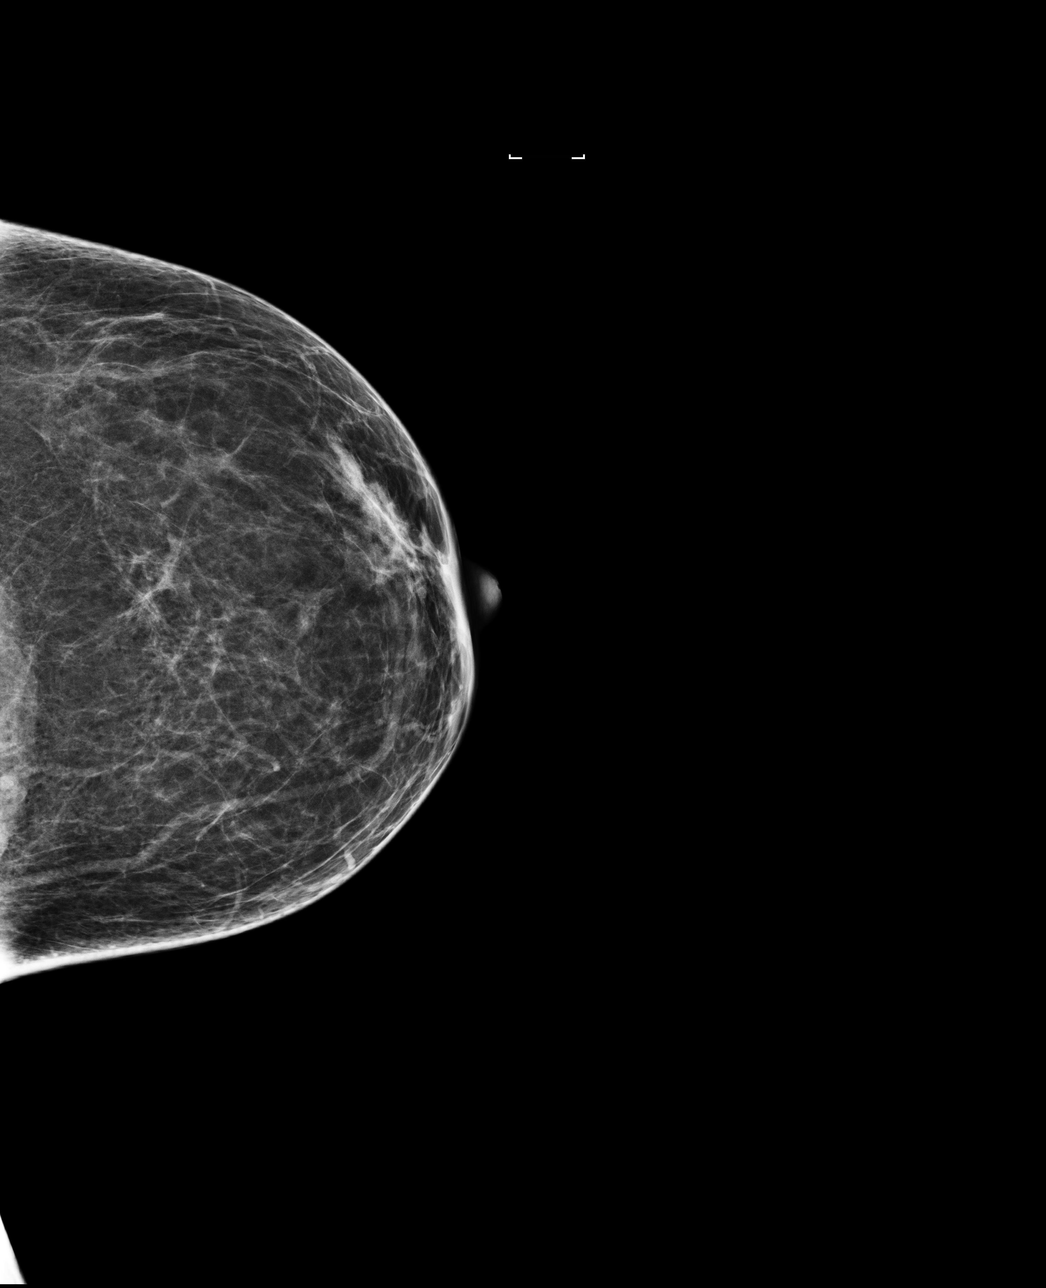

[R MLO]
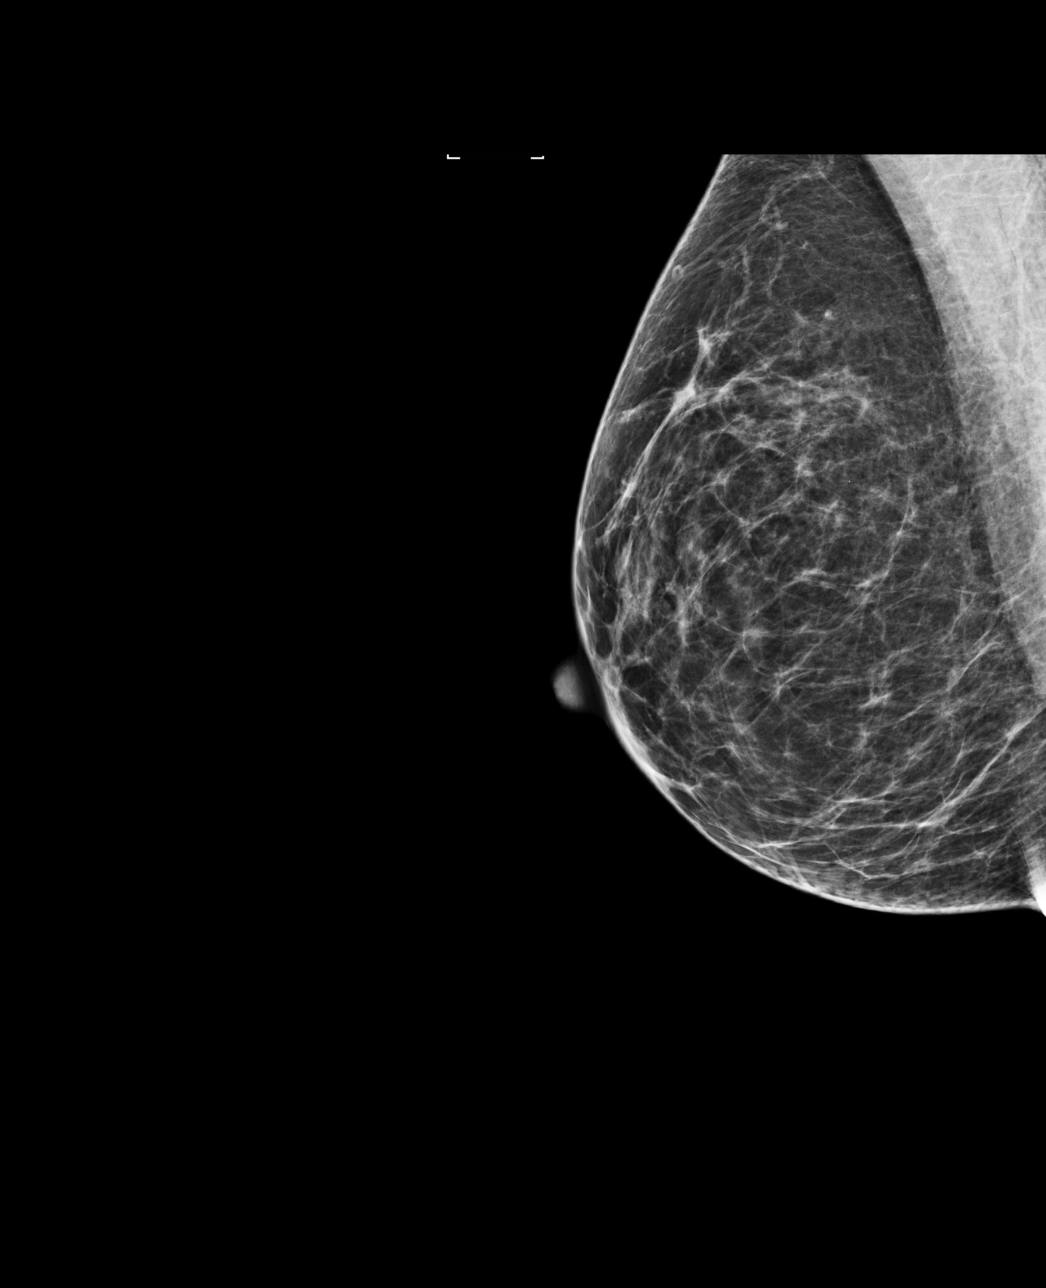

[R CC]
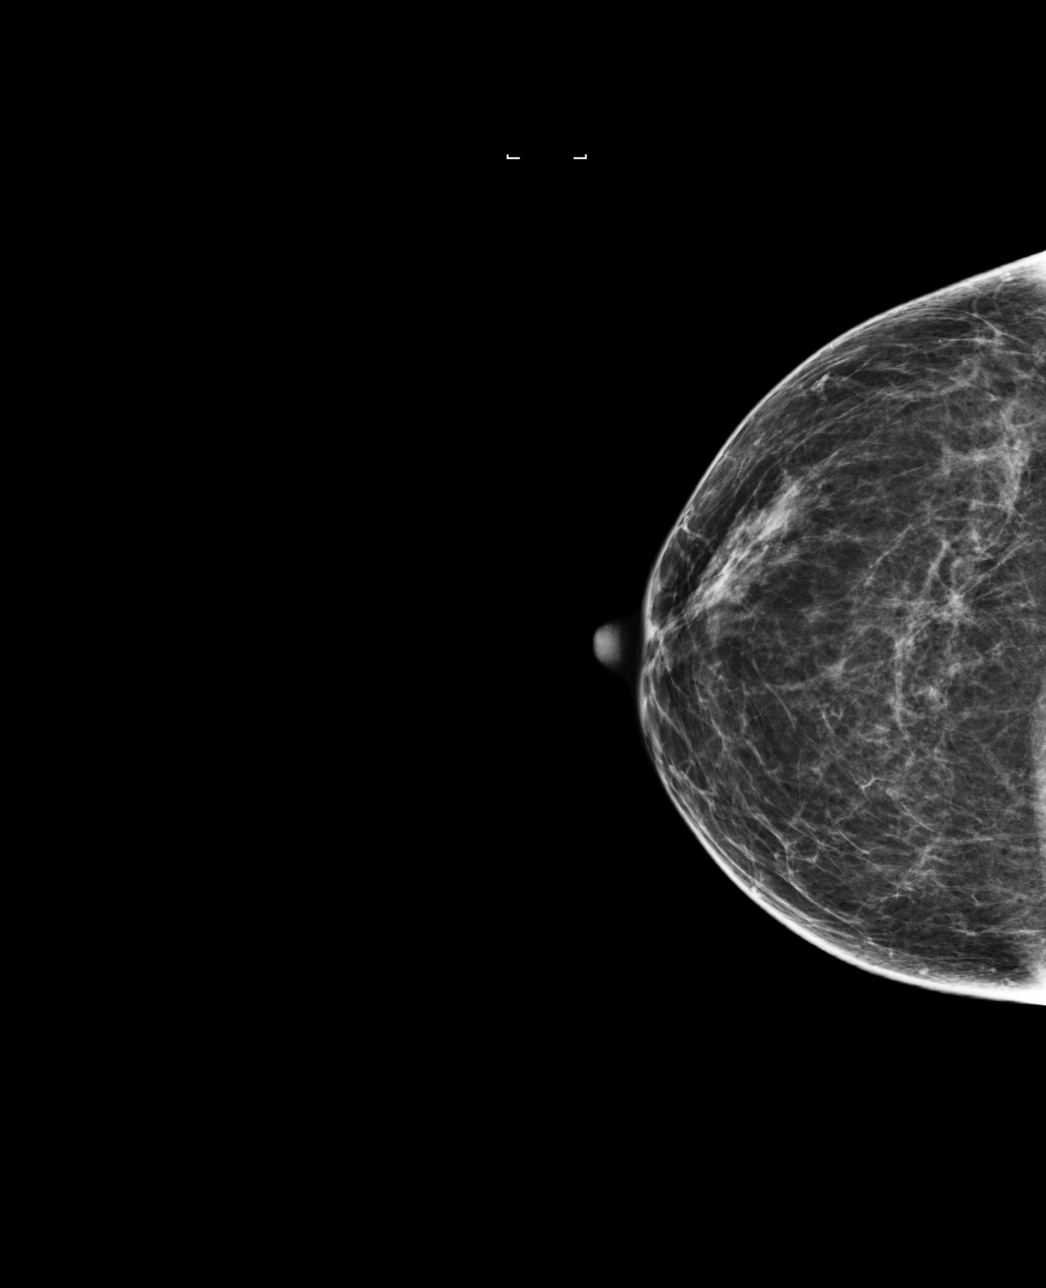

[L MLO]
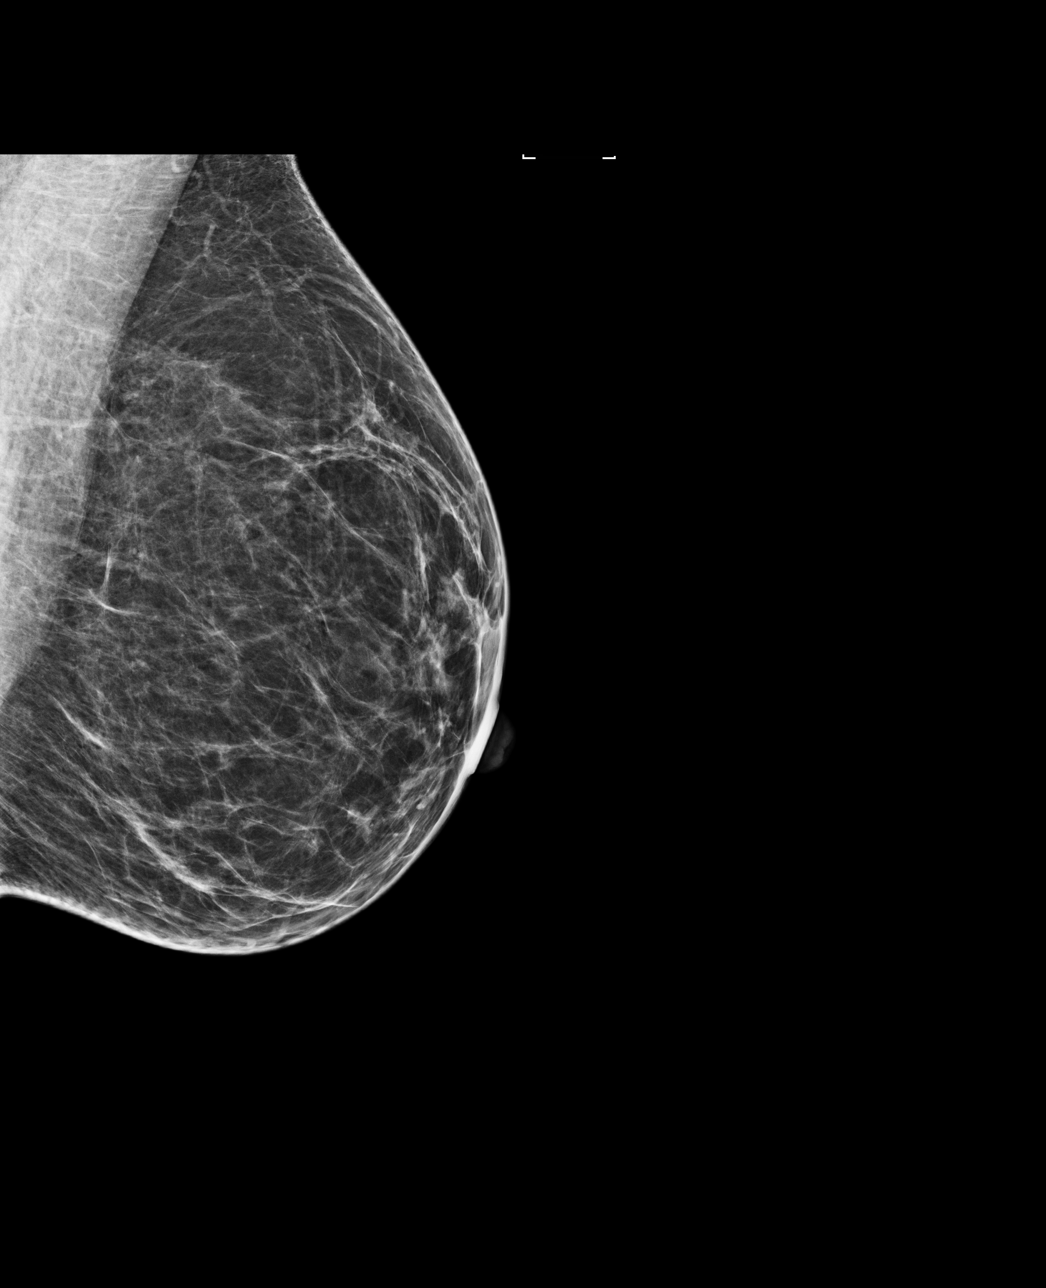

[4 of 4 positions shown; findings below may reference images not displayed]

ACR Breast Density Category c: The breast tissue is heterogeneously
dense, which may obscure small masses.
FINDINGS: There are no findings suspicious for malignancy. Images were
processed with CAD.
IMPRESSION: No mammographic evidence of malignancy. A result letter of this
screening mammogram will be mailed directly to the patient.

RECOMMENDATION:
Screening mammogram in one year. (Code:[id number])

BI-RADS CATEGORY  1: Negative.

## 2016-04-30 ENCOUNTER — Other Ambulatory Visit: Payer: Self-pay | Admitting: Internal Medicine

## 2016-05-18 ENCOUNTER — Other Ambulatory Visit: Payer: Self-pay | Admitting: Internal Medicine

## 2016-05-19 NOTE — Telephone Encounter (Signed)
Rx faxed to [facility] [address]

## 2016-05-19 NOTE — Telephone Encounter (Signed)
ok'd rx for ambien #30with no refills.   

## 2016-06-24 ENCOUNTER — Other Ambulatory Visit: Payer: Self-pay | Admitting: Internal Medicine

## 2016-06-24 DIAGNOSIS — Z1231 Encounter for screening mammogram for malignant neoplasm of breast: Secondary | ICD-10-CM

## 2016-07-10 ENCOUNTER — Ambulatory Visit
Admission: RE | Admit: 2016-07-10 | Discharge: 2016-07-10 | Disposition: A | Discharge status: Home / Self Care | Payer: BC Managed Care – PPO | Source: Ambulatory Visit | Attending: Internal Medicine | Admitting: Internal Medicine

## 2016-07-10 DIAGNOSIS — Z1231 Encounter for screening mammogram for malignant neoplasm of breast: Secondary | ICD-10-CM | POA: Insufficient documentation

## 2016-08-11 ENCOUNTER — Encounter: Payer: Self-pay | Admitting: Internal Medicine

## 2016-08-12 NOTE — Telephone Encounter (Signed)
See my chart message.  Please see if her daughter can come in the 2:30 spot today.  Thanks

## 2016-09-25 ENCOUNTER — Ambulatory Visit (INDEPENDENT_AMBULATORY_CARE_PROVIDER_SITE_OTHER): Payer: BC Managed Care – PPO | Admitting: Internal Medicine

## 2016-09-25 ENCOUNTER — Encounter: Payer: Self-pay | Admitting: Internal Medicine

## 2016-09-25 VITALS — BP 130/84 | HR 77 | Wt 134.6 lb

## 2016-09-25 DIAGNOSIS — I10 Essential (primary) hypertension: Secondary | ICD-10-CM | POA: Diagnosis not present

## 2016-09-25 DIAGNOSIS — C439 Malignant melanoma of skin, unspecified: Secondary | ICD-10-CM

## 2016-09-25 DIAGNOSIS — R202 Paresthesia of skin: Secondary | ICD-10-CM

## 2016-09-25 DIAGNOSIS — E7800 Pure hypercholesterolemia, unspecified: Secondary | ICD-10-CM | POA: Diagnosis not present

## 2016-09-25 DIAGNOSIS — E039 Hypothyroidism, unspecified: Secondary | ICD-10-CM | POA: Diagnosis not present

## 2016-09-25 DIAGNOSIS — R200 Anesthesia of skin: Secondary | ICD-10-CM

## 2016-09-25 LAB — LIPID PANEL
Cholesterol: 246 mg/dL — ABNORMAL HIGH (ref 0–200)
HDL: 48.2 mg/dL (ref >39.00)
NonHDL: 198.26
Total CHOL/HDL Ratio: 5
Triglycerides: 330 mg/dL — ABNORMAL HIGH (ref 0.0–149.0)
VLDL: 66 mg/dL — ABNORMAL HIGH (ref 0.0–40.0)

## 2016-09-25 LAB — HEPATIC FUNCTION PANEL
ALT: 27 U/L (ref 0–35)
AST: 23 U/L (ref 0–37)
Albumin: 4.6 g/dL (ref 3.5–5.2)
Alkaline Phosphatase: 66 U/L (ref 39–117)
Bilirubin, Direct: 0.1 mg/dL (ref 0.0–0.3)
Total Bilirubin: 0.6 mg/dL (ref 0.2–1.2)
Total Protein: 7.3 g/dL (ref 6.0–8.3)

## 2016-09-25 LAB — BASIC METABOLIC PANEL
BUN: 13 mg/dL (ref 6–23)
CO2: 30 mEq/L (ref 19–32)
Calcium: 9.5 mg/dL (ref 8.4–10.5)
Chloride: 106 mEq/L (ref 96–112)
Creatinine, Ser: 0.82 mg/dL (ref 0.40–1.20)
GFR: 76.45 mL/min (ref >60.00)
Glucose, Bld: 106 mg/dL — ABNORMAL HIGH (ref 70–99)
Potassium: 4.8 mEq/L (ref 3.5–5.1)
Sodium: 142 mEq/L (ref 135–145)

## 2016-09-25 LAB — LDL CHOLESTEROL, DIRECT: Direct LDL: 123 mg/dL

## 2016-09-25 NOTE — Progress Notes (Signed)
Patient ID: [name], female   DOB: 07/27/1961, 55 y.o.   MRN: [id number]   Subjective:    Patient ID: [name], female    DOB: [birth year], 55 y.o.   MRN: [id number]  HPI  Patient here for a scheduled follow up.  She reports she is doing well.  Feels good.  Stays active.  No chest pain.  No sob.  Exercises regularly.  No acid reflux.  No abdominal pain or cramping.  Bowels stable.  She does report some tingling in her right upper thigh.  Has some back issues intermittently.  She describes a little discomfort when she first gets up, but as she is more active back is better.  No increased back pain.  No pain radiating down her leg.  Tingling mostly located to right thigh, but she will occasionally noticed sensation that goes down to her foot. No weakness.  Does not prevent her from exercising.     Past Medical History:  Diagnosis Date  . Hypercholesterolemia   . Hypertension   . Hypothyroidism   . Melanoma ([place])   . Patella fracture    right   Past Surgical History:  Procedure Laterality Date  . BUNIONECTOMY  1999, 2000   Family History  Problem Relation Age of Onset  . Hypertension      siblings  . Hypercholesterolemia      siblings  . Heart disease      parent  . Hypertension      parent  . Lupus      sister  . Breast cancer Neg Hx    Social History   Social History  . Marital status: Married    Spouse name: N/A  . Number of children: 2  . Years of education: N/A   Social History Main Topics  . Smoking status: Never Smoker  . Smokeless tobacco: Never Used  . Alcohol use No  . Drug use: No  . Sexual activity: Not Asked   Other Topics Concern  . None   Social History Narrative  . None    Outpatient Encounter Prescriptions as of 09/25/2016  Medication Sig  . atorvastatin (LIPITOR) 10 MG tablet TAKE 1 TABLET BY MOUTH ONCE A DAY  . levothyroxine (SYNTHROID, LEVOTHROID) 75 MCG tablet TAKE 1 TABLET BY MOUTH EVERY DAY  . metoprolol tartrate  (LOPRESSOR) 25 MG tablet TAKE 1/2 TABLET BY MOUTH DAILY  . Multiple Vitamin (MULTIVITAMIN) tablet Take 1 tablet by mouth daily.  Marland Kitchen triamcinolone cream (KENALOG) 0.1 % Apply 1 application topically 2 (two) times daily.  Marland Kitchen zolpidem (AMBIEN) 5 MG tablet TAKE 1 TABLET BY MOUTH AT BEDTIME AS NEEDED   No facility-administered encounter medications on file as of 09/25/2016.     Review of Systems  Constitutional: Negative for appetite change and unexpected weight change.  HENT: Negative for congestion and sinus pressure.   Respiratory: Negative for cough, chest tightness and shortness of breath.   Cardiovascular: Negative for chest pain, palpitations and leg swelling.  Gastrointestinal: Negative for abdominal pain, diarrhea, nausea and vomiting.  Genitourinary: Negative for difficulty urinating and dysuria.  Musculoskeletal: Positive for back pain. Negative for joint swelling and myalgias.  Skin: Negative for color change and rash.  Neurological: Negative for dizziness, light-headedness and headaches.       Tingling in her right thigh and leg as outlined.    Psychiatric/Behavioral: Negative for agitation and dysphoric mood.       Objective:    Physical Exam  Constitutional:  She appears well-nourished. No distress.  HENT:  Nose: Nose normal.  Mouth/Throat: Oropharynx is clear and moist.  Neck: Neck supple. No thyromegaly present.  Cardiovascular: Normal rate and regular rhythm.   Pulmonary/Chest: Breath sounds normal. No respiratory distress. She has no wheezes.  Abdominal: Soft. Bowel sounds are normal. There is no tenderness.  Musculoskeletal: She exhibits no edema or tenderness.  No pain with SLR.  No motor weakness noted on exam.    Lymphadenopathy:    She has no cervical adenopathy.  Skin: No rash noted. No erythema.  Psychiatric: She has a normal mood and affect. Her behavior is normal.    BP 130/84   Pulse 77   Wt 134 lb 9.6 oz (61.1 kg)   SpO2 96%   BMI 23.10 kg/m  Wt  Readings from Last 3 Encounters:  09/25/16 134 lb 9.6 oz (61.1 kg)  03/25/16 128 lb (58.1 kg)  09/20/15 132 lb (59.9 kg)     Lab Results  Component Value Date   WBC 4.4 09/20/2015   HGB 13.6 09/20/2015   HCT 40.2 09/20/2015   PLT 245.0 09/20/2015   GLUCOSE 106 (H) 09/25/2016   CHOL 246 (H) 09/25/2016   TRIG 330.0 (H) 09/25/2016   HDL 48.20 09/25/2016   LDLDIRECT 123.0 09/25/2016   LDLCALC 110 (H) 05/05/2014   ALT 27 09/25/2016   AST 23 09/25/2016   NA 142 09/25/2016   K 4.8 09/25/2016   CL 106 09/25/2016   CREATININE 0.82 09/25/2016   BUN 13 09/25/2016   CO2 30 09/25/2016   TSH 1.30 03/25/2016    Mm Digital Screening Bilateral  Result Date: 07/10/2016 CLINICAL DATA:  Screening. EXAM: DIGITAL SCREENING BILATERAL MAMMOGRAM WITH CAD COMPARISON:  Previous exam(s). ACR Breast Density Category b: There are scattered areas of fibroglandular density. FINDINGS: There are no findings suspicious for malignancy. Images were processed with CAD. IMPRESSION: No mammographic evidence of malignancy. A result letter of this screening mammogram will be mailed directly to the patient. RECOMMENDATION: Screening mammogram in one year. (Code:SM-B-01Y) BI-RADS CATEGORY  1: Negative. Electronically Signed   By: Altamese Cabal M.D.   On: 07/10/2016 16:07       Assessment & Plan:   Problem List Items Addressed This Visit    Hypercholesterolemia - Primary    Low cholesterol diet and exercise.  Follow lipid panel.        Relevant Orders   Lipid panel (Completed)   Hepatic function panel (Completed)   Hypertension    Blood pressure under good control.  Continue same medication regimen.  Follow pressures.  Follow metabolic panel.        Relevant Orders   Basic metabolic panel (Completed)   Hypothyroidism    On thyroid replacement.  Follow tsh.        Melanoma ([place])    Followed by dermatology.       Numbness and tingling of right leg    Symptoms as outlined.  Unclear etiology.   Question if originating from her back.  No increased pain.  No weakness.  She wants to follow for now.  Will decrease some of her more strenuous exercise and see if improves.  Will call with update.  Further w/up pending.  May need L-S spine xray.           Einar Pheasant, MD

## 2016-09-26 ENCOUNTER — Encounter: Payer: Self-pay | Admitting: Internal Medicine

## 2016-09-27 ENCOUNTER — Encounter: Payer: Self-pay | Admitting: Internal Medicine

## 2016-09-27 DIAGNOSIS — R200 Anesthesia of skin: Secondary | ICD-10-CM | POA: Insufficient documentation

## 2016-09-27 DIAGNOSIS — R202 Paresthesia of skin: Secondary | ICD-10-CM

## 2016-09-27 NOTE — Assessment & Plan Note (Signed)
On thyroid replacement.  Follow tsh.  

## 2016-09-27 NOTE — Assessment & Plan Note (Signed)
Blood pressure under good control.  Continue same medication regimen.  Follow pressures.  Follow metabolic panel.   

## 2016-09-27 NOTE — Assessment & Plan Note (Signed)
Low cholesterol diet and exercise.  Follow lipid panel.   

## 2016-09-27 NOTE — Assessment & Plan Note (Signed)
Followed by dermatology

## 2016-09-27 NOTE — Assessment & Plan Note (Signed)
Symptoms as outlined.  Unclear etiology.  Question if originating from her back.  No increased pain.  No weakness.  She wants to follow for now.  Will decrease some of her more strenuous exercise and see if improves.  Will call with update.  Further w/up pending.  May need L-S spine xray.

## 2016-11-05 ENCOUNTER — Other Ambulatory Visit: Payer: Self-pay | Admitting: Internal Medicine

## 2016-11-07 NOTE — Telephone Encounter (Signed)
Please advise on refill.

## 2016-11-13 ENCOUNTER — Other Ambulatory Visit: Payer: Self-pay | Admitting: Internal Medicine

## 2016-12-02 ENCOUNTER — Other Ambulatory Visit: Payer: Self-pay | Admitting: Internal Medicine

## 2017-03-31 ENCOUNTER — Ambulatory Visit (INDEPENDENT_AMBULATORY_CARE_PROVIDER_SITE_OTHER): Payer: BC Managed Care – PPO | Admitting: Internal Medicine

## 2017-03-31 ENCOUNTER — Encounter: Payer: Self-pay | Admitting: Internal Medicine

## 2017-03-31 VITALS — BP 130/82 | HR 66 | Temp 98.6°F | Resp 12 | Ht 64.0 in | Wt 130.0 lb

## 2017-03-31 DIAGNOSIS — R200 Anesthesia of skin: Secondary | ICD-10-CM

## 2017-03-31 DIAGNOSIS — Z1231 Encounter for screening mammogram for malignant neoplasm of breast: Secondary | ICD-10-CM | POA: Diagnosis not present

## 2017-03-31 DIAGNOSIS — E7800 Pure hypercholesterolemia, unspecified: Secondary | ICD-10-CM | POA: Diagnosis not present

## 2017-03-31 DIAGNOSIS — Z86010 Personal history of colonic polyps: Secondary | ICD-10-CM

## 2017-03-31 DIAGNOSIS — Z0000 Encounter for general adult medical examination without abnormal findings: Secondary | ICD-10-CM | POA: Diagnosis not present

## 2017-03-31 DIAGNOSIS — C439 Malignant melanoma of skin, unspecified: Secondary | ICD-10-CM | POA: Diagnosis not present

## 2017-03-31 DIAGNOSIS — I10 Essential (primary) hypertension: Secondary | ICD-10-CM | POA: Diagnosis not present

## 2017-03-31 DIAGNOSIS — R202 Paresthesia of skin: Secondary | ICD-10-CM

## 2017-03-31 DIAGNOSIS — Z1239 Encounter for other screening for malignant neoplasm of breast: Secondary | ICD-10-CM

## 2017-03-31 DIAGNOSIS — E039 Hypothyroidism, unspecified: Secondary | ICD-10-CM | POA: Diagnosis not present

## 2017-03-31 LAB — LIPID PANEL
Cholesterol: 201 mg/dL — ABNORMAL HIGH (ref 0–200)
HDL: 45.6 mg/dL (ref >39.00)
NonHDL: 155.56
Total CHOL/HDL Ratio: 4
Triglycerides: 226 mg/dL — ABNORMAL HIGH (ref 0.0–149.0)
VLDL: 45.2 mg/dL — ABNORMAL HIGH (ref 0.0–40.0)

## 2017-03-31 LAB — COMPREHENSIVE METABOLIC PANEL
ALT: 20 U/L (ref 0–35)
AST: 20 U/L (ref 0–37)
Albumin: 4.4 g/dL (ref 3.5–5.2)
Alkaline Phosphatase: 64 U/L (ref 39–117)
BUN: 16 mg/dL (ref 6–23)
CO2: 29 mEq/L (ref 19–32)
Calcium: 9.4 mg/dL (ref 8.4–10.5)
Chloride: 107 mEq/L (ref 96–112)
Creatinine, Ser: 0.84 mg/dL (ref 0.40–1.20)
GFR: 74.22 mL/min (ref >60.00)
Glucose, Bld: 105 mg/dL — ABNORMAL HIGH (ref 70–99)
Potassium: 4.6 mEq/L (ref 3.5–5.1)
Sodium: 141 mEq/L (ref 135–145)
Total Bilirubin: 0.5 mg/dL (ref 0.2–1.2)
Total Protein: 7.3 g/dL (ref 6.0–8.3)

## 2017-03-31 LAB — CBC WITH DIFFERENTIAL/PLATELET
Basophils Absolute: 0 10*3/uL (ref 0.0–0.1)
Basophils Relative: 0.7 % (ref 0.0–3.0)
Eosinophils Absolute: 0.1 10*3/uL (ref 0.0–0.7)
Eosinophils Relative: 1.2 % (ref 0.0–5.0)
HCT: 40.2 % (ref 36.0–46.0)
Hemoglobin: 13.4 g/dL (ref 12.0–15.0)
Lymphocytes Relative: 27.3 % (ref 12.0–46.0)
Lymphs Abs: 1.4 10*3/uL (ref 0.7–4.0)
MCHC: 33.4 g/dL (ref 30.0–36.0)
MCV: 85.4 fl (ref 78.0–100.0)
Monocytes Absolute: 0.4 10*3/uL (ref 0.1–1.0)
Monocytes Relative: 7.6 % (ref 3.0–12.0)
Neutro Abs: 3.3 10*3/uL (ref 1.4–7.7)
Neutrophils Relative %: 63.2 % (ref 43.0–77.0)
Platelets: 241 10*3/uL (ref 150.0–400.0)
RBC: 4.71 Mil/uL (ref 3.87–5.11)
RDW: 12.8 % (ref 11.5–15.5)
WBC: 5.3 10*3/uL (ref 4.0–10.5)

## 2017-03-31 LAB — LDL CHOLESTEROL, DIRECT: Direct LDL: 108 mg/dL

## 2017-03-31 LAB — TSH: TSH: 1.01 u[IU]/mL (ref 0.35–4.50)

## 2017-03-31 NOTE — Progress Notes (Signed)
Patient ID: Kathryn Francis, female   DOB: [birth year], 56 y.o.   MRN: [id number]   Subjective:    Patient ID: Kathryn Francis, female    DOB: 18-Aug-1961, 56 y.o.   MRN: [id number]  HPI  Patient here for her physical exam.   She is doing well.  Exercising.  No chest pain.  No sob.  No acid reflux reported.  No abdominal pain.  Bowels moving.  No urinary or vaginal problems.  Daughter just got married this past weekend.  Overall doing well.  Does report the leg tingling has improved.     Past Medical History:  Diagnosis Date  . Hypercholesterolemia   . Hypertension   . Hypothyroidism   . Melanoma ([place])   . Patella fracture    right   Past Surgical History:  Procedure Laterality Date  . BUNIONECTOMY  1999, 2000   Family History  Problem Relation Age of Onset  . Hypertension Unknown        siblings  . Hypercholesterolemia Unknown        siblings  . Heart disease Unknown        parent  . Hypertension Unknown        parent  . Lupus Unknown        sister  . Breast cancer Neg Hx    Social History   Social History  . Marital status: Married    Spouse name: N/A  . Number of children: 2  . Years of education: N/A   Social History Main Topics  . Smoking status: Never Smoker  . Smokeless tobacco: Never Used  . Alcohol use No  . Drug use: No  . Sexual activity: Not Asked   Other Topics Concern  . None   Social History Narrative  . None    Outpatient Encounter Prescriptions as of 03/31/2017  Medication Sig  . atorvastatin (LIPITOR) 10 MG tablet TAKE 1 TABLET BY MOUTH ONCE A DAY  . levothyroxine (SYNTHROID, LEVOTHROID) 75 MCG tablet TAKE 1 TABLET BY MOUTH EVERY DAY  . metoprolol tartrate (LOPRESSOR) 25 MG tablet TAKE 1/2 TABLET BY MOUTH DAILY  . Multiple Vitamin (MULTIVITAMIN) tablet Take 1 tablet by mouth daily.  Marland Kitchen triamcinolone cream (KENALOG) 0.1 % Apply 1 application topically 2 (two) times daily.  Marland Kitchen zolpidem (AMBIEN) 5 MG tablet TAKE 1 TABLET BY MOUTH AT BEDTIME  AS NEEDED   No facility-administered encounter medications on file as of 03/31/2017.     Review of Systems  Constitutional: Negative for appetite change and unexpected weight change.  HENT: Negative for congestion and sinus pressure.   Eyes: Negative for pain and visual disturbance.  Respiratory: Negative for cough, chest tightness and shortness of breath.   Cardiovascular: Negative for chest pain, palpitations and leg swelling.  Gastrointestinal: Negative for abdominal pain, diarrhea, nausea and vomiting.  Genitourinary: Negative for difficulty urinating and dysuria.  Musculoskeletal: Negative for back pain and joint swelling.  Skin: Negative for color change and rash.  Neurological: Negative for dizziness, light-headedness and headaches.  Hematological: Negative for adenopathy. Does not bruise/bleed easily.  Psychiatric/Behavioral: Negative for agitation and dysphoric mood.       Objective:     Blood pressure rechecked by me:  116/78  Physical Exam  Constitutional: She is oriented to person, place, and time. She appears well-developed and well-nourished. No distress.  HENT:  Nose: Nose normal.  Mouth/Throat: Oropharynx is clear and moist.  Eyes: Right eye exhibits no discharge. Left eye exhibits no discharge.  No scleral icterus.  Neck: Neck supple. No thyromegaly present.  Cardiovascular: Normal rate and regular rhythm.   Pulmonary/Chest: Breath sounds normal. No accessory muscle usage. No tachypnea. No respiratory distress. She has no decreased breath sounds. She has no wheezes. She has no rhonchi. Right breast exhibits no inverted nipple, no mass, no nipple discharge and no tenderness (no axillary adenopathy). Left breast exhibits no inverted nipple, no mass, no nipple discharge and no tenderness (no axilarry adenopathy).  Abdominal: Soft. Bowel sounds are normal. There is no tenderness.  Musculoskeletal: She exhibits no edema or tenderness.  Grip strength - normal.  Negative  falen's and negative tinel's.    Lymphadenopathy:    She has no cervical adenopathy.  Neurological: She is alert and oriented to person, place, and time.  Skin: Skin is warm. No rash noted. No erythema.  Psychiatric: She has a normal mood and affect. Her behavior is normal.    BP 130/82 (BP Location: Left Arm, Patient Position: Sitting, Cuff Size: Small)   Pulse 66   Temp 98.6 F (37 C) (Oral)   Resp 12   Ht 5\' 4"  (1.626 m)   Wt 130 lb (59 kg)   SpO2 98%   BMI 22.31 kg/m  Wt Readings from Last 3 Encounters:  03/31/17 130 lb (59 kg)  09/25/16 134 lb 9.6 oz (61.1 kg)  03/25/16 128 lb (58.1 kg)     Lab Results  Component Value Date   WBC 4.4 09/20/2015   HGB 13.6 09/20/2015   HCT 40.2 09/20/2015   PLT 245.0 09/20/2015   GLUCOSE 106 (H) 09/25/2016   CHOL 246 (H) 09/25/2016   TRIG 330.0 (H) 09/25/2016   HDL 48.20 09/25/2016   LDLDIRECT 123.0 09/25/2016   LDLCALC 110 (H) 05/05/2014   ALT 27 09/25/2016   AST 23 09/25/2016   NA 142 09/25/2016   K 4.8 09/25/2016   CL 106 09/25/2016   CREATININE 0.82 09/25/2016   BUN 13 09/25/2016   CO2 30 09/25/2016   TSH 1.30 03/25/2016    Mm Digital Screening Bilateral  Result Date: 07/10/2016 CLINICAL DATA:  Screening. EXAM: DIGITAL SCREENING BILATERAL MAMMOGRAM WITH CAD COMPARISON:  Previous exam(s). ACR Breast Density Category b: There are scattered areas of fibroglandular density. FINDINGS: There are no findings suspicious for malignancy. Images were processed with CAD. IMPRESSION: No mammographic evidence of malignancy. A result letter of this screening mammogram will be mailed directly to the patient. RECOMMENDATION: Screening mammogram in one year. (Code:SM-B-01Y) BI-RADS CATEGORY  1: Negative. Electronically Signed   By: Altamese Cabal M.D.   On: 07/10/2016 16:07       Assessment & Plan:   Problem List Items Addressed This Visit    Health care maintenance    Physical today 03/31/17.  PAP 03/21/15.  Colonoscopy 06/2013.   Recommended f/u in 2024.  Mammogram 07/10/16 - Birads I.        History of colonic polyps    Colonoscopy 06/2013.  Recommended f/u in 06/2023.        Hypercholesterolemia    Low cholesterol diet and exercise.  Follow lipid panel.        Relevant Orders   Lipid panel   Hypertension    Blood pressure under good control.  Continue same medication regimen.  Follow pressures.  Follow metabolic panel.        Relevant Orders   CBC with Differential/Platelet   Comprehensive metabolic panel   Hypothyroidism    On thyroid replacement.  Follow tsh.  Relevant Orders   TSH   Melanoma ([place])    Followed by dermatology.        Numbness and tingling of right leg    Is better.  Only occurs occasionally - in certain positions.  Desires no further w/up.  Follow.         Other Visit Diagnoses    Routine general medical examination at a health care facility    -  Primary   Numbness and tingling in right hand       occurs occasionally - only when riding bike or mowing - with gripping.  cock up splint.  follow.  notify me if persistent or worsening.     Breast cancer screening       Relevant Orders   MM DIGITAL SCREENING BILATERAL       Einar Pheasant, MD

## 2017-03-31 NOTE — Addendum Note (Signed)
Addended by: Leeanne Rio on: 03/31/2017 09:18 AM   Modules accepted: Orders

## 2017-03-31 NOTE — Assessment & Plan Note (Signed)
On thyroid replacement.  Follow tsh.  

## 2017-03-31 NOTE — Patient Instructions (Signed)
Cock up wrist splint 

## 2017-03-31 NOTE — Assessment & Plan Note (Signed)
Low cholesterol diet and exercise.  Follow lipid panel.   

## 2017-03-31 NOTE — Assessment & Plan Note (Signed)
Colonoscopy 06/2013.  Recommended f/u in 06/2023.

## 2017-03-31 NOTE — Progress Notes (Signed)
Pre-visit discussion using our clinic review tool. No additional management support is needed unless otherwise documented below in the visit note.  

## 2017-03-31 NOTE — Assessment & Plan Note (Signed)
Blood pressure under good control.  Continue same medication regimen.  Follow pressures.  Follow metabolic panel.   

## 2017-03-31 NOTE — Assessment & Plan Note (Signed)
Followed by dermatology

## 2017-03-31 NOTE — Assessment & Plan Note (Addendum)
Physical today 03/31/17.  PAP 03/21/15.  Colonoscopy 06/2013.  Recommended f/u in 2024.  Mammogram 07/10/16 - Birads I.

## 2017-03-31 NOTE — Assessment & Plan Note (Signed)
Is better.  Only occurs occasionally - in certain positions.  Desires no further w/up.  Follow.

## 2017-04-21 ENCOUNTER — Other Ambulatory Visit: Payer: Self-pay | Admitting: Internal Medicine

## 2017-05-17 ENCOUNTER — Other Ambulatory Visit: Payer: Self-pay | Admitting: Internal Medicine

## 2017-05-20 ENCOUNTER — Other Ambulatory Visit: Payer: Self-pay | Admitting: Internal Medicine

## 2017-05-20 NOTE — Telephone Encounter (Signed)
Last given 11/09/16 #30 0 rf Last app 04/09/17 F/u 09/30/17

## 2017-05-20 NOTE — Telephone Encounter (Signed)
ok'd ambien #[id number] with no refills.  rx signed and placed in box.

## 2017-05-20 NOTE — Telephone Encounter (Signed)
Faxed to pharmacy

## 2017-07-13 ENCOUNTER — Ambulatory Visit
Admission: RE | Admit: 2017-07-13 | Discharge: 2017-07-13 | Disposition: A | Discharge status: Home / Self Care | Payer: BC Managed Care – PPO | Source: Ambulatory Visit | Attending: Internal Medicine | Admitting: Internal Medicine

## 2017-07-13 DIAGNOSIS — Z1231 Encounter for screening mammogram for malignant neoplasm of breast: Secondary | ICD-10-CM | POA: Diagnosis present

## 2017-07-13 DIAGNOSIS — Z1239 Encounter for other screening for malignant neoplasm of breast: Secondary | ICD-10-CM

## 2017-07-30 ENCOUNTER — Encounter: Payer: Self-pay | Admitting: Internal Medicine

## 2017-07-30 NOTE — Telephone Encounter (Signed)
Ok to establish care.  Please get her information and schedule.   Thanks

## 2017-07-30 NOTE — Telephone Encounter (Signed)
Lm on vm to call back and get info

## 2017-08-12 ENCOUNTER — Encounter: Payer: Self-pay | Admitting: Internal Medicine

## 2017-09-02 ENCOUNTER — Other Ambulatory Visit: Payer: Self-pay | Admitting: Internal Medicine

## 2017-09-30 ENCOUNTER — Ambulatory Visit: Payer: BC Managed Care – PPO | Admitting: Internal Medicine

## 2017-09-30 ENCOUNTER — Encounter: Payer: Self-pay | Admitting: Internal Medicine

## 2017-09-30 DIAGNOSIS — I10 Essential (primary) hypertension: Secondary | ICD-10-CM

## 2017-09-30 DIAGNOSIS — R739 Hyperglycemia, unspecified: Secondary | ICD-10-CM

## 2017-09-30 DIAGNOSIS — E039 Hypothyroidism, unspecified: Secondary | ICD-10-CM

## 2017-09-30 DIAGNOSIS — E7800 Pure hypercholesterolemia, unspecified: Secondary | ICD-10-CM

## 2017-10-01 ENCOUNTER — Other Ambulatory Visit: Payer: Self-pay | Admitting: Internal Medicine

## 2017-10-02 NOTE — Telephone Encounter (Signed)
Order placed for fasting labs.   

## 2017-10-02 NOTE — Telephone Encounter (Signed)
Refill request for Ambien, last seen 03-31-17, last filled 05-20-17 .  Please advise.

## 2017-10-09 ENCOUNTER — Encounter: Payer: Self-pay | Admitting: Internal Medicine

## 2017-10-20 ENCOUNTER — Encounter: Payer: Self-pay | Admitting: Internal Medicine

## 2017-10-20 DIAGNOSIS — M810 Age-related osteoporosis without current pathological fracture: Secondary | ICD-10-CM | POA: Insufficient documentation

## 2017-11-08 ENCOUNTER — Other Ambulatory Visit: Payer: Self-pay | Admitting: Internal Medicine

## 2017-12-16 ENCOUNTER — Other Ambulatory Visit: Payer: Self-pay | Admitting: Internal Medicine

## 2017-12-30 ENCOUNTER — Encounter: Payer: Self-pay | Admitting: Internal Medicine

## 2017-12-30 ENCOUNTER — Other Ambulatory Visit: Payer: Self-pay | Admitting: Radiology

## 2017-12-30 ENCOUNTER — Other Ambulatory Visit: Payer: BC Managed Care – PPO

## 2017-12-30 ENCOUNTER — Ambulatory Visit: Payer: BC Managed Care – PPO | Admitting: Internal Medicine

## 2017-12-30 VITALS — BP 128/78 | HR 70 | Temp 97.8°F | Resp 16 | Wt 133.0 lb

## 2017-12-30 DIAGNOSIS — C439 Malignant melanoma of skin, unspecified: Secondary | ICD-10-CM | POA: Diagnosis not present

## 2017-12-30 DIAGNOSIS — R739 Hyperglycemia, unspecified: Secondary | ICD-10-CM | POA: Diagnosis not present

## 2017-12-30 DIAGNOSIS — E039 Hypothyroidism, unspecified: Secondary | ICD-10-CM

## 2017-12-30 DIAGNOSIS — M810 Age-related osteoporosis without current pathological fracture: Secondary | ICD-10-CM

## 2017-12-30 DIAGNOSIS — E7800 Pure hypercholesterolemia, unspecified: Secondary | ICD-10-CM | POA: Diagnosis not present

## 2017-12-30 DIAGNOSIS — I10 Essential (primary) hypertension: Secondary | ICD-10-CM

## 2017-12-30 LAB — BASIC METABOLIC PANEL
BUN: 12 mg/dL (ref 6–23)
CO2: 27 mEq/L (ref 19–32)
Calcium: 9.7 mg/dL (ref 8.4–10.5)
Chloride: 103 mEq/L (ref 96–112)
Creatinine, Ser: 0.74 mg/dL (ref 0.40–1.20)
GFR: 85.68 mL/min (ref >60.00)
Glucose, Bld: 103 mg/dL — ABNORMAL HIGH (ref 70–99)
Potassium: 4.6 mEq/L (ref 3.5–5.1)
Sodium: 139 mEq/L (ref 135–145)

## 2017-12-30 LAB — HEPATIC FUNCTION PANEL
ALT: 28 U/L (ref 0–35)
AST: 21 U/L (ref 0–37)
Albumin: 4.4 g/dL (ref 3.5–5.2)
Alkaline Phosphatase: 49 U/L (ref 39–117)
Bilirubin, Direct: 0 mg/dL (ref 0.0–0.3)
Total Bilirubin: 0.5 mg/dL (ref 0.2–1.2)
Total Protein: 7.4 g/dL (ref 6.0–8.3)

## 2017-12-30 LAB — LIPID PANEL
Cholesterol: 206 mg/dL — ABNORMAL HIGH (ref 0–200)
HDL: 49.3 mg/dL (ref >39.00)
NonHDL: 156.41
Total CHOL/HDL Ratio: 4
Triglycerides: 258 mg/dL — ABNORMAL HIGH (ref 0.0–149.0)
VLDL: 51.6 mg/dL — ABNORMAL HIGH (ref 0.0–40.0)

## 2017-12-30 LAB — TSH: TSH: 1.57 u[IU]/mL (ref 0.35–4.50)

## 2017-12-30 LAB — LDL CHOLESTEROL, DIRECT: Direct LDL: 109 mg/dL

## 2017-12-30 LAB — HEMOGLOBIN A1C: Hgb A1c MFr Bld: 5.8 % (ref 4.6–6.5)

## 2017-12-30 NOTE — Progress Notes (Signed)
Patient ID: [name], female   DOB: May 01, 1961, 57 y.o.   MRN: [id number]   Subjective:    Patient ID: [name], female    DOB: [birth year], 57 y.o.   MRN: [id number]  HPI  Patient here for a scheduled follow up.  She reports she is doing well.  Feels good.  Back to exercising.  No chest pain.  No sob. No acid reflux.  No abdominal pain.  Bowels moving  No urine change.  Overall feels good.  Recently fractured her foot.  Followed by ortho.  Healed now.  Had bone density.  On fosamax now.     Past Medical History:  Diagnosis Date  . Hypercholesterolemia   . Hypertension   . Hypothyroidism   . Melanoma ([place])   . Patella fracture    right   Past Surgical History:  Procedure Laterality Date  . BUNIONECTOMY  1999, 2000   Family History  Problem Relation Age of Onset  . Hypertension Unknown        siblings  . Hypercholesterolemia Unknown        siblings  . Heart disease Unknown        parent  . Hypertension Unknown        parent  . Lupus Unknown        sister  . Breast cancer Neg Hx    Social History   Socioeconomic History  . Marital status: Married    Spouse name: None  . Number of children: 2  . Years of education: None  . Highest education level: None  Social Needs  . Financial resource strain: None  . Food insecurity - worry: None  . Food insecurity - inability: None  . Transportation needs - medical: None  . Transportation needs - non-medical: None  Occupational History  . None  Tobacco Use  . Smoking status: Never Smoker  . Smokeless tobacco: Never Used  Substance and Sexual Activity  . Alcohol use: No    Alcohol/week: 0.0 oz  . Drug use: No  . Sexual activity: None  Other Topics Concern  . None  Social History Narrative  . None    Outpatient Encounter Medications as of 12/30/2017  Medication Sig  . alendronate (FOSAMAX) 70 MG tablet Take 70 mg by mouth once a week.  Marland Kitchen atorvastatin (LIPITOR) 10 MG tablet TAKE 1 TABLET BY MOUTH ONCE A  DAY  . levothyroxine (SYNTHROID, LEVOTHROID) 75 MCG tablet TAKE 1 TABLET BY MOUTH EVERY DAY  . metoprolol tartrate (LOPRESSOR) 25 MG tablet TAKE 1/2 TABLET BY MOUTH DAILY  . Multiple Vitamin (MULTIVITAMIN) tablet Take 1 tablet by mouth daily.  Marland Kitchen triamcinolone cream (KENALOG) 0.1 % Apply 1 application topically 2 (two) times daily.  Marland Kitchen zolpidem (AMBIEN) 5 MG tablet TAKE 1 TABLET BY MOUTH AT BEDTIME AS NEEDED   No facility-administered encounter medications on file as of 12/30/2017.     Review of Systems  Constitutional: Negative for appetite change and unexpected weight change.  HENT: Negative for congestion and sinus pressure.   Respiratory: Negative for cough, chest tightness and shortness of breath.   Cardiovascular: Negative for chest pain, palpitations and leg swelling.  Gastrointestinal: Negative for abdominal pain, diarrhea, nausea and vomiting.  Genitourinary: Negative for difficulty urinating and dysuria.  Musculoskeletal: Negative for joint swelling and myalgias.  Skin: Negative for color change and rash.  Neurological: Negative for dizziness, light-headedness and headaches.  Psychiatric/Behavioral: Negative for agitation and dysphoric mood.  Objective:    Physical Exam  Constitutional: She appears well-developed and well-nourished. No distress.  HENT:  Nose: Nose normal.  Mouth/Throat: Oropharynx is clear and moist.  Neck: Neck supple. No thyromegaly present.  Cardiovascular: Normal rate and regular rhythm.  Pulmonary/Chest: Breath sounds normal. No respiratory distress. She has no wheezes.  Abdominal: Soft. Bowel sounds are normal. There is no tenderness.  Musculoskeletal: She exhibits no edema or tenderness.  Lymphadenopathy:    She has no cervical adenopathy.  Skin: No rash noted. No erythema.  Psychiatric: She has a normal mood and affect. Her behavior is normal.    BP 128/78 (BP Location: Left Arm, Patient Position: Sitting, Cuff Size: Normal)   Pulse 70    Temp 97.8 F (36.6 C) (Oral)   Resp 16   Wt 133 lb (60.3 kg)   SpO2 99%   BMI 22.83 kg/m  Wt Readings from Last 3 Encounters:  12/30/17 133 lb (60.3 kg)  03/31/17 130 lb (59 kg)  09/25/16 134 lb 9.6 oz (61.1 kg)     Lab Results  Component Value Date   WBC 5.3 03/31/2017   HGB 13.4 03/31/2017   HCT 40.2 03/31/2017   PLT 241.0 03/31/2017   GLUCOSE 103 (H) 12/30/2017   CHOL 206 (H) 12/30/2017   TRIG 258.0 (H) 12/30/2017   HDL 49.30 12/30/2017   LDLDIRECT 109.0 12/30/2017   LDLCALC 110 (H) 05/05/2014   ALT 28 12/30/2017   AST 21 12/30/2017   NA 139 12/30/2017   K 4.6 12/30/2017   CL 103 12/30/2017   CREATININE 0.74 12/30/2017   BUN 12 12/30/2017   CO2 27 12/30/2017   TSH 1.57 12/30/2017   HGBA1C 5.8 12/30/2017    Mm Digital Screening Bilateral  Result Date: 07/13/2017 CLINICAL DATA:  Screening. EXAM: DIGITAL SCREENING BILATERAL MAMMOGRAM WITH CAD COMPARISON:  Previous exam(s). ACR Breast Density Category b: There are scattered areas of fibroglandular density. FINDINGS: There are no findings suspicious for malignancy. Images were processed with CAD. IMPRESSION: No mammographic evidence of malignancy. A result letter of this screening mammogram will be mailed directly to the patient. RECOMMENDATION: Screening mammogram in one year. (Code:SM-B-01Y) BI-RADS CATEGORY  1: Negative. Electronically Signed   By: Claudie Revering M.D.   On: 07/13/2017 19:09       Assessment & Plan:   Problem List Items Addressed This Visit    Hypercholesterolemia    Low cholesterol diet and exercise.  Follow lipid panel.       Relevant Orders   Hepatic function panel   Lipid panel   Hypertension    Blood pressure under good control.  Continue same medication regimen.  Follow pressures.  Follow metabolic panel.        Relevant Orders   Hepatic function panel (Completed)   Basic Metabolic Panel (BMET) (Completed)   CBC with Differential/Platelet   Basic metabolic panel   Hypothyroidism     On thyroid replacement.  Follow tsh.        Relevant Orders   TSH (Completed)   Melanoma ([place])    Followed by dermatology.        Osteoporosis    On fosamax.  Recent fracture.  Follow.       Relevant Medications   alendronate (FOSAMAX) 70 MG tablet    Other Visit Diagnoses    Elevated blood sugar    -  Primary   Relevant Orders   Hemoglobin A1C (Completed)   Hypercholesteremia       Relevant  Orders   Lipid Profile (Completed)   Hyperglycemia       Relevant Orders   Hemoglobin A1c       Einar Pheasant, MD

## 2017-12-31 ENCOUNTER — Encounter: Payer: Self-pay | Admitting: Internal Medicine

## 2018-01-02 ENCOUNTER — Encounter: Payer: Self-pay | Admitting: Internal Medicine

## 2018-01-02 NOTE — Assessment & Plan Note (Signed)
Low cholesterol diet and exercise.  Follow lipid panel.   

## 2018-01-02 NOTE — Assessment & Plan Note (Signed)
On thyroid replacement.  Follow tsh.  

## 2018-01-02 NOTE — Assessment & Plan Note (Signed)
Followed by dermatology

## 2018-01-02 NOTE — Assessment & Plan Note (Signed)
Blood pressure under good control.  Continue same medication regimen.  Follow pressures.  Follow metabolic panel.   

## 2018-01-02 NOTE — Assessment & Plan Note (Signed)
On fosamax.  Recent fracture.  Follow.

## 2018-02-05 ENCOUNTER — Other Ambulatory Visit: Payer: Self-pay | Admitting: Internal Medicine

## 2018-02-09 NOTE — Telephone Encounter (Signed)
Refilled: 10/02/2017 Last OV: 12/30/2017 Next OV: 07/07/2018

## 2018-05-09 ENCOUNTER — Other Ambulatory Visit: Payer: Self-pay | Admitting: Internal Medicine

## 2018-05-23 ENCOUNTER — Other Ambulatory Visit: Payer: Self-pay | Admitting: Internal Medicine

## 2018-05-25 NOTE — Telephone Encounter (Signed)
Last OV 12/30/17 Next OV 07/07/18 Last refill 02/09/18

## 2018-05-28 NOTE — Telephone Encounter (Signed)
faxed

## 2018-06-12 ENCOUNTER — Emergency Department: Payer: BC Managed Care – PPO

## 2018-06-12 ENCOUNTER — Emergency Department
Admission: EM | Admit: 2018-06-12 | Discharge: 2018-06-12 | Disposition: A | Discharge status: Home / Self Care | Payer: BC Managed Care – PPO | Attending: Emergency Medicine | Admitting: Emergency Medicine

## 2018-06-12 ENCOUNTER — Encounter: Payer: Self-pay | Admitting: Emergency Medicine

## 2018-06-12 DIAGNOSIS — Z85828 Personal history of other malignant neoplasm of skin: Secondary | ICD-10-CM | POA: Insufficient documentation

## 2018-06-12 DIAGNOSIS — Q899 Congenital malformation, unspecified: Secondary | ICD-10-CM

## 2018-06-12 DIAGNOSIS — Y998 Other external cause status: Secondary | ICD-10-CM | POA: Insufficient documentation

## 2018-06-12 DIAGNOSIS — I10 Essential (primary) hypertension: Secondary | ICD-10-CM | POA: Diagnosis not present

## 2018-06-12 DIAGNOSIS — S52032A Displaced fracture of olecranon process with intraarticular extension of left ulna, initial encounter for closed fracture: Secondary | ICD-10-CM | POA: Diagnosis not present

## 2018-06-12 DIAGNOSIS — Y929 Unspecified place or not applicable: Secondary | ICD-10-CM | POA: Insufficient documentation

## 2018-06-12 DIAGNOSIS — E039 Hypothyroidism, unspecified: Secondary | ICD-10-CM | POA: Diagnosis not present

## 2018-06-12 DIAGNOSIS — Z79899 Other long term (current) drug therapy: Secondary | ICD-10-CM | POA: Insufficient documentation

## 2018-06-12 DIAGNOSIS — S59902A Unspecified injury of left elbow, initial encounter: Secondary | ICD-10-CM | POA: Diagnosis present

## 2018-06-12 DIAGNOSIS — Y9355 Activity, bike riding: Secondary | ICD-10-CM | POA: Insufficient documentation

## 2018-06-12 MED ORDER — MORPHINE SULFATE (PF) 4 MG/ML IV SOLN
4.0000 mg | Freq: Once | INTRAVENOUS | Status: AC
  Administered 2018-06-12: 4 mg via INTRAMUSCULAR
  Filled 2018-06-12: qty 1

## 2018-06-12 MED ORDER — OXYCODONE-ACETAMINOPHEN 5-325 MG PO TABS: 1.0000 | ORAL_TABLET | ORAL | 0 refills | Status: DC | PRN

## 2018-06-12 MED ORDER — BACITRACIN-NEOMYCIN-POLYMYXIN 400-5-5000 EX OINT
TOPICAL_OINTMENT | CUTANEOUS | Status: AC
  Filled 2018-06-12: qty 1

## 2018-06-12 MED ORDER — ONDANSETRON 4 MG PO TBDP
4.0000 mg | ORAL_TABLET | Freq: Once | ORAL | Status: AC
  Administered 2018-06-12: 4 mg via ORAL
  Filled 2018-06-12 (×2): qty 1

## 2018-06-12 MED ORDER — DOUBLE ANTIBIOTIC 500-10000 UNIT/GM EX OINT
TOPICAL_OINTMENT | Freq: Two times a day (BID) | CUTANEOUS | Status: DC
  Filled 2018-06-12: qty 28.4

## 2018-06-12 MED ORDER — ONDANSETRON 4 MG PO TBDP: 4.0000 mg | ORAL_TABLET | Freq: Three times a day (TID) | ORAL | 0 refills | Status: DC | PRN

## 2018-06-12 MED ORDER — OXYCODONE-ACETAMINOPHEN 5-325 MG PO TABS
1.0000 | ORAL_TABLET | ORAL | Status: DC | PRN
  Administered 2018-06-12: 1 via ORAL
  Filled 2018-06-12: qty 1

## 2018-06-12 NOTE — Discharge Instructions (Signed)
Please take Percocet 1 to 2 tablets every 4-6 hours as needed for severe pain.  You may Zofran as needed for any nausea.  Please use stool softeners to prevent constipation.  Call orthopedic office Monday morning to schedule follow-up appointment with surgeon.

## 2018-06-12 NOTE — ED Notes (Signed)
ocl splint applied by Geralyn Flash PA
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
## 2018-06-16 ENCOUNTER — Other Ambulatory Visit: Payer: Self-pay

## 2018-06-16 ENCOUNTER — Ambulatory Visit
Admission: RE | Admit: 2018-06-16 | Discharge: 2018-06-16 | Disposition: A | Discharge status: Home / Self Care | Payer: BC Managed Care – PPO | Source: Ambulatory Visit | Attending: Orthopedic Surgery | Admitting: Orthopedic Surgery

## 2018-06-16 ENCOUNTER — Encounter
Admission: RE | Disposition: A | Discharge status: Home / Self Care | Payer: Self-pay | Source: Ambulatory Visit | Attending: Orthopedic Surgery

## 2018-06-16 ENCOUNTER — Ambulatory Visit: Payer: BC Managed Care – PPO

## 2018-06-16 ENCOUNTER — Ambulatory Visit: Payer: BC Managed Care – PPO | Admitting: Anesthesiology

## 2018-06-16 DIAGNOSIS — Z8249 Family history of ischemic heart disease and other diseases of the circulatory system: Secondary | ICD-10-CM | POA: Insufficient documentation

## 2018-06-16 DIAGNOSIS — Z882 Allergy status to sulfonamides status: Secondary | ICD-10-CM | POA: Insufficient documentation

## 2018-06-16 DIAGNOSIS — E039 Hypothyroidism, unspecified: Secondary | ICD-10-CM | POA: Insufficient documentation

## 2018-06-16 DIAGNOSIS — E785 Hyperlipidemia, unspecified: Secondary | ICD-10-CM | POA: Diagnosis not present

## 2018-06-16 DIAGNOSIS — Z79899 Other long term (current) drug therapy: Secondary | ICD-10-CM | POA: Diagnosis not present

## 2018-06-16 DIAGNOSIS — S52032A Displaced fracture of olecranon process with intraarticular extension of left ulna, initial encounter for closed fracture: Secondary | ICD-10-CM | POA: Diagnosis not present

## 2018-06-16 DIAGNOSIS — I10 Essential (primary) hypertension: Secondary | ICD-10-CM | POA: Diagnosis not present

## 2018-06-16 DIAGNOSIS — Y9355 Activity, bike riding: Secondary | ICD-10-CM | POA: Insufficient documentation

## 2018-06-16 DIAGNOSIS — I493 Ventricular premature depolarization: Secondary | ICD-10-CM | POA: Diagnosis not present

## 2018-06-16 HISTORY — PX: ORIF ELBOW FRACTURE: SHX5031

## 2018-06-16 SURGERY — OPEN REDUCTION INTERNAL FIXATION (ORIF) ELBOW/OLECRANON FRACTURE
Anesthesia: General | Site: Elbow | Laterality: Left | Wound class: Clean

## 2018-06-16 MED ORDER — FENTANYL CITRATE (PF) 100 MCG/2ML IJ SOLN
INTRAMUSCULAR | Status: AC
  Administered 2018-06-16: 25 ug via INTRAVENOUS
  Filled 2018-06-16: qty 2

## 2018-06-16 MED ORDER — CEFAZOLIN SODIUM-DEXTROSE 2-4 GM/100ML-% IV SOLN
INTRAVENOUS | Status: AC
  Filled 2018-06-16: qty 100

## 2018-06-16 MED ORDER — ONDANSETRON HCL 4 MG/2ML IJ SOLN: 4.0000 mg | Freq: Once | INTRAMUSCULAR | Status: DC | PRN

## 2018-06-16 MED ORDER — SCOPOLAMINE 1 MG/3DAYS TD PT72
MEDICATED_PATCH | TRANSDERMAL | Status: AC
  Administered 2018-06-16: 1.5 mg via TRANSDERMAL
  Filled 2018-06-16: qty 1

## 2018-06-16 MED ORDER — KETOROLAC TROMETHAMINE 30 MG/ML IJ SOLN
INTRAMUSCULAR | Status: AC
  Filled 2018-06-16: qty 1

## 2018-06-16 MED ORDER — MIDAZOLAM HCL 5 MG/5ML IJ SOLN
INTRAMUSCULAR | Status: DC | PRN
  Administered 2018-06-16: 2 mg via INTRAVENOUS

## 2018-06-16 MED ORDER — FENTANYL CITRATE (PF) 100 MCG/2ML IJ SOLN
INTRAMUSCULAR | Status: DC | PRN
  Administered 2018-06-16 (×2): 25 ug via INTRAVENOUS
  Administered 2018-06-16: 50 ug via INTRAVENOUS

## 2018-06-16 MED ORDER — HYDROMORPHONE HCL 1 MG/ML IJ SOLN
INTRAMUSCULAR | Status: AC
  Filled 2018-06-16: qty 1

## 2018-06-16 MED ORDER — HYDROCODONE-ACETAMINOPHEN 5-325 MG PO TABS
ORAL_TABLET | ORAL | Status: AC
  Filled 2018-06-16: qty 2

## 2018-06-16 MED ORDER — ONDANSETRON HCL 4 MG/2ML IJ SOLN
INTRAMUSCULAR | Status: DC | PRN
  Administered 2018-06-16: 4 mg via INTRAVENOUS

## 2018-06-16 MED ORDER — LIDOCAINE HCL (PF) 2 % IJ SOLN
INTRAMUSCULAR | Status: AC
  Filled 2018-06-16: qty 10

## 2018-06-16 MED ORDER — HYDROMORPHONE HCL 1 MG/ML IJ SOLN
INTRAMUSCULAR | Status: DC | PRN
  Administered 2018-06-16: .2 mg via INTRAVENOUS

## 2018-06-16 MED ORDER — DEXAMETHASONE SODIUM PHOSPHATE 10 MG/ML IJ SOLN
INTRAMUSCULAR | Status: DC | PRN
  Administered 2018-06-16: 6 mg via INTRAVENOUS

## 2018-06-16 MED ORDER — ACETAMINOPHEN 10 MG/ML IV SOLN
INTRAVENOUS | Status: DC | PRN
  Administered 2018-06-16: 1000 mg via INTRAVENOUS

## 2018-06-16 MED ORDER — SCOPOLAMINE 1 MG/3DAYS TD PT72
1.0000 | MEDICATED_PATCH | TRANSDERMAL | Status: DC
  Administered 2018-06-16: 1.5 mg via TRANSDERMAL

## 2018-06-16 MED ORDER — SEVOFLURANE IN SOLN
RESPIRATORY_TRACT | Status: AC
  Filled 2018-06-16: qty 250

## 2018-06-16 MED ORDER — LIDOCAINE HCL (CARDIAC) PF 100 MG/5ML IV SOSY
PREFILLED_SYRINGE | INTRAVENOUS | Status: DC | PRN
  Administered 2018-06-16: 60 mg via INTRAVENOUS

## 2018-06-16 MED ORDER — CEFAZOLIN SODIUM-DEXTROSE 2-4 GM/100ML-% IV SOLN
2.0000 g | Freq: Once | INTRAVENOUS | Status: AC
  Administered 2018-06-16: 2 g via INTRAVENOUS

## 2018-06-16 MED ORDER — NEOMYCIN-POLYMYXIN B GU 40-200000 IR SOLN
Status: DC | PRN
  Administered 2018-06-16: 2 mL

## 2018-06-16 MED ORDER — LACTATED RINGERS IV SOLN
Freq: Once | INTRAVENOUS | Status: AC
  Administered 2018-06-16 (×2): via INTRAVENOUS

## 2018-06-16 MED ORDER — FENTANYL CITRATE (PF) 100 MCG/2ML IJ SOLN
INTRAMUSCULAR | Status: AC
  Filled 2018-06-16: qty 2

## 2018-06-16 MED ORDER — PROPOFOL 10 MG/ML IV BOLUS
INTRAVENOUS | Status: AC
  Filled 2018-06-16: qty 20

## 2018-06-16 MED ORDER — MIDAZOLAM HCL 2 MG/2ML IJ SOLN
INTRAMUSCULAR | Status: AC
  Filled 2018-06-16: qty 2

## 2018-06-16 MED ORDER — FENTANYL CITRATE (PF) 100 MCG/2ML IJ SOLN
25.0000 ug | INTRAMUSCULAR | Status: AC | PRN
  Administered 2018-06-16 (×6): 25 ug via INTRAVENOUS

## 2018-06-16 MED ORDER — KETOROLAC TROMETHAMINE 30 MG/ML IJ SOLN
INTRAMUSCULAR | Status: DC | PRN
  Administered 2018-06-16: 30 mg via INTRAVENOUS

## 2018-06-16 MED ORDER — BUPIVACAINE HCL (PF) 0.5 % IJ SOLN
INTRAMUSCULAR | Status: AC
  Filled 2018-06-16: qty 30

## 2018-06-16 MED ORDER — DEXAMETHASONE SODIUM PHOSPHATE 10 MG/ML IJ SOLN
INTRAMUSCULAR | Status: AC
  Filled 2018-06-16: qty 1

## 2018-06-16 MED ORDER — PROPOFOL 10 MG/ML IV BOLUS
INTRAVENOUS | Status: DC | PRN
  Administered 2018-06-16: 150 mg via INTRAVENOUS

## 2018-06-16 MED ORDER — HYDROCODONE-ACETAMINOPHEN 5-325 MG PO TABS
1.0000 | ORAL_TABLET | ORAL | Status: DC | PRN
  Administered 2018-06-16: 2 via ORAL

## 2018-06-16 MED ORDER — ONDANSETRON HCL 4 MG/2ML IJ SOLN
INTRAMUSCULAR | Status: AC
  Filled 2018-06-16: qty 2

## 2018-06-16 MED ORDER — ACETAMINOPHEN 10 MG/ML IV SOLN
INTRAVENOUS | Status: AC
  Filled 2018-06-16: qty 100

## 2018-06-16 SURGICAL SUPPLY — 56 items
BANDAGE ELASTIC 4 LF NS (GAUZE/BANDAGES/DRESSINGS) ×6 IMPLANT
BIT DRILL 2.5X2.75 QC CALB (BIT) ×3 IMPLANT
BIT DRILL CALIBRATED 2.7 (BIT) ×2 IMPLANT
BIT DRILL CALIBRATED 2.7MM (BIT) ×1
BNDG COHESIVE 4X5 TAN STRL (GAUZE/BANDAGES/DRESSINGS) ×3 IMPLANT
CANISTER SUCT 1200ML W/VALVE (MISCELLANEOUS) ×3 IMPLANT
CHLORAPREP W/TINT 26ML (MISCELLANEOUS) ×3 IMPLANT
CLOSURE WOUND 1/2 X4 (GAUZE/BANDAGES/DRESSINGS) ×1
COVER LIGHT HANDLE STERIS (MISCELLANEOUS) ×3 IMPLANT
CUFF TOURN 18 STER (MISCELLANEOUS) ×3 IMPLANT
CUFF TOURN 24 STER (MISCELLANEOUS) ×3 IMPLANT
DRAPE C-ARM XRAY 36X54 (DRAPES) ×3 IMPLANT
DRAPE SHEET LG 3/4 BI-LAMINATE (DRAPES) ×3 IMPLANT
DRAPE U-SHAPE 47X51 STRL (DRAPES) ×3 IMPLANT
ELECT CAUTERY BLADE 6.4 (BLADE) ×3 IMPLANT
ELECT REM PT RETURN 9FT ADLT (ELECTROSURGICAL) ×3
ELECTRODE REM PT RTRN 9FT ADLT (ELECTROSURGICAL) ×1 IMPLANT
GAUZE PETRO XEROFOAM 1X8 (MISCELLANEOUS) ×6 IMPLANT
GAUZE SPONGE 4X4 12PLY STRL (GAUZE/BANDAGES/DRESSINGS) ×3 IMPLANT
GLOVE SURG SYN 9.0  PF PI (GLOVE) ×2
GLOVE SURG SYN 9.0 PF PI (GLOVE) ×1 IMPLANT
GOWN SRG 2XL LVL 4 RGLN SLV (GOWNS) ×1 IMPLANT
GOWN STRL NON-REIN 2XL LVL4 (GOWNS) ×2
GOWN STRL REUS W/ TWL LRG LVL3 (GOWN DISPOSABLE) ×1 IMPLANT
GOWN STRL REUS W/TWL LRG LVL3 (GOWN DISPOSABLE) ×2
K-WIRE ACE 1.6X6 (WIRE) ×6
KIT TURNOVER KIT A (KITS) ×3 IMPLANT
KWIRE ACE 1.6X6 (WIRE) ×2 IMPLANT
NEEDLE FILTER BLUNT 18X 1/2SAF (NEEDLE) ×2
NEEDLE FILTER BLUNT 18X1 1/2 (NEEDLE) ×1 IMPLANT
NS IRRIG 500ML POUR BTL (IV SOLUTION) ×3 IMPLANT
PACK EXTREMITY ARMC (MISCELLANEOUS) ×3 IMPLANT
PAD ABD DERMACEA PRESS 5X9 (GAUZE/BANDAGES/DRESSINGS) ×6 IMPLANT
PAD CAST CTTN 4X4 STRL (SOFTGOODS) ×2 IMPLANT
PAD PREP 24X41 OB/GYN DISP (PERSONAL CARE ITEMS) ×3 IMPLANT
PADDING CAST COTTON 4X4 STRL (SOFTGOODS) ×4
PLATE OLECRANON LRG (Plate) ×3 IMPLANT
SCALPEL PROTECTED #15 DISP (BLADE) ×6 IMPLANT
SCREW LOCK CORT STAR 3.5X12 (Screw) ×3 IMPLANT
SCREW LOCK CORT STAR 3.5X14 (Screw) ×15 IMPLANT
SCREW LOCK CORT STAR 3.5X20 (Screw) ×6 IMPLANT
SCREW LOCK CORT STAR 3.5X24 (Screw) ×3 IMPLANT
SCREW LOW PROFILE 22MMX3.5MM (Screw) ×3 IMPLANT
SCREW NON LOCKING LP 3.5 16MM (Screw) ×3 IMPLANT
SPLINT CAST 1 STEP 5X30 WHT (MISCELLANEOUS) ×3 IMPLANT
SPONGE LAP 18X18 RF (DISPOSABLE) ×3 IMPLANT
STAPLER SKIN PROX 35W (STAPLE) ×3 IMPLANT
STOCKINETTE M/LG 89821 (MISCELLANEOUS) ×3 IMPLANT
STRIP CLOSURE SKIN 1/2X4 (GAUZE/BANDAGES/DRESSINGS) ×2 IMPLANT
SUT ETHILON 3-0 FS-10 30 BLK (SUTURE) ×9
SUT VIC AB 0 CT2 27 (SUTURE) ×3 IMPLANT
SUT VIC AB 2-0 CT2 27 (SUTURE) ×3 IMPLANT
SUT VICRYL+ 3-0 36IN CT-1 (SUTURE) ×3 IMPLANT
SUTURE EHLN 3-0 FS-10 30 BLK (SUTURE) ×3 IMPLANT
SYR 5ML LL (SYRINGE) ×3 IMPLANT
WASHER 3.5MM (Orthopedic Implant) ×3 IMPLANT

## 2018-06-16 NOTE — Op Note (Signed)
06/16/2018  2:54 PM  PATIENT:  Kathryn Francis  57 y.o. female  PRE-OPERATIVE DIAGNOSIS:  DISPLACED fractures of olecranon process with intra-articular left ulna  POST-OPERATIVE DIAGNOSIS:  DISPLACED fractures of olecranon process with intra-articular left ulna  PROCEDURE:  Procedure(s): OPEN REDUCTION INTERNAL FIXATION (ORIF) ELBOW/OLECRANON FRACTURE (Left)  SURGEON: Laurene Footman, MD  ASSISTANTS: None  ANESTHESIA:   general  EBL:  Total I/O In: 1400 [I.V.:1400] Out: 100 [Blood:100]  BLOOD ADMINISTERED:none  DRAINS: none   LOCAL MEDICATIONS USED:  NONE  SPECIMEN:  No Specimen  DISPOSITION OF SPECIMEN:  N/A  COUNTS:  YES  TOURNIQUET:  * Missing tourniquet times found for documented tourniquets in log: [id number] *  IMPLANTS: Biomet olecranon locking plate with multiple screws  DICTATION: .Dragon Dictation patient was brought to the operating room and after adequate anesthesia was obtained, the left arm was prepped and draped you sterile fashion.  After patient identification and timeout procedures were completed a dorsal approach was made to the proximal ulna curving on the radial side around the olecranon and developing flaps to expose the bone.  Cleaning off clot the comminuted fracture was provisionally reduced in extension and a K wire put down the canal C arm was used to assess alignment.  When acceptable alignment was obtained a locking plate was applied and a K wire placed proximally and distally to hold the plate in position.  Proximal locking screws were placed Lecker 9 and a compression screw in the distal portion of the plate to get a more anatomic alignment.  When this was completed the K wires were removed and sequential drilling measuring and placing predominantly locking screws was performed with the compression screw being removed at the close of the case as it did not have as good grip as the locking screws.  The fracture had a longitudinal component and so the  screws on the tabs more proximally were both filled to try to aid in maintaining alignment of this essentially nondisplaced longitudinal fracture.  Under fluoroscopy the fracture appeared stable there is no gapping with extreme flexion of the elbow.  The wound was thoroughly irrigated and closed with 3-0 Vicryl subcutaneously and 4-0 nylon for the skin.  Xeroform 4 x 4's ABD web roll and a posterior splint were applied with Ace wraps applied as well.  PLAN OF CARE: Discharge to home after PACU  PATIENT DISPOSITION:  PACU - hemodynamically stable.

## 2018-06-16 NOTE — Transfer of Care (Signed)
Immediate Anesthesia Transfer of Care Note  Patient: Kathryn Francis  Procedure(s) Performed: OPEN REDUCTION INTERNAL FIXATION (ORIF) ELBOW/OLECRANON FRACTURE (Left Elbow)  Patient Location: PACU  Anesthesia Type:General  Level of Consciousness: sedated  Airway & Oxygen Therapy: Patient Spontanous Breathing and Patient connected to face mask oxygen  Post-op Assessment: Report given to RN and Post -op Vital signs reviewed and stable  Post vital signs: Reviewed and stable  Last Vitals:  Vitals Value Taken Time  BP 147/84 06/16/2018  2:52 PM  Temp 36.4 C 06/16/2018  2:52 PM  Pulse 81 06/16/2018  2:55 PM  Resp 16 06/16/2018  2:55 PM  SpO2 96 % 06/16/2018  2:55 PM  Vitals shown include unvalidated device data.  Last Pain:  Vitals:   06/16/18 1452  TempSrc:   PainSc: Asleep         Complications: No apparent anesthesia complications

## 2018-06-16 NOTE — Anesthesia Preprocedure Evaluation (Signed)
Anesthesia Evaluation  Patient identified by MRN, date of birth, ID band Patient awake    Reviewed: Allergy & Precautions, NPO status , Patient's Chart, lab work & pertinent test results  History of Anesthesia Complications Negative for: history of anesthetic complications  Airway Mallampati: II       Dental   Pulmonary neg sleep apnea, neg COPD,           Cardiovascular hypertension, Pt. on medications (-) Past MI and (-) CHF (-) dysrhythmias (-) Valvular Problems/Murmurs     Neuro/Psych neg Seizures    GI/Hepatic Neg liver ROS, neg GERD  ,  Endo/Other  neg diabetesHypothyroidism   Renal/GU negative Renal ROS     Musculoskeletal   Abdominal   Peds  Hematology   Anesthesia Other Findings   Reproductive/Obstetrics                             Anesthesia Physical Anesthesia Plan  ASA: II  Anesthesia Plan: General   Post-op Pain Management:    Induction: Intravenous  PONV Risk Score and Plan: 3  Airway Management Planned: LMA  Additional Equipment:   Intra-op Plan:   Post-operative Plan:   Informed Consent: I have reviewed the patients History and Physical, chart, labs and discussed the procedure including the risks, benefits and alternatives for the proposed anesthesia with the patient or authorized representative who has indicated his/her understanding and acceptance.     Plan Discussed with:   Anesthesia Plan Comments:         Anesthesia Quick Evaluation

## 2018-06-16 NOTE — Anesthesia Postprocedure Evaluation (Signed)
Anesthesia Post Note  Patient: Kathryn Francis  Procedure(s) Performed: OPEN REDUCTION INTERNAL FIXATION (ORIF) ELBOW/OLECRANON FRACTURE (Left Elbow)  Patient location during evaluation: PACU Anesthesia Type: General Level of consciousness: awake and alert Pain management: pain level controlled Vital Signs Assessment: post-procedure vital signs reviewed and stable Respiratory status: spontaneous breathing and respiratory function stable Cardiovascular status: stable Anesthetic complications: no     Last Vitals:  Vitals:   06/16/18 1522 06/16/18 1538  BP: (!) 152/97 (!) 149/88  Pulse: 84 71  Resp: 15 12  Temp:    SpO2: 93% 95%    Last Pain:  Vitals:   06/16/18 1540  TempSrc:   PainSc: 7                  Zollie Clemence K

## 2018-06-16 NOTE — H&P (Signed)
Reviewed paper H+P, will be scanned into chart. No changes noted.  

## 2018-06-16 NOTE — Discharge Instructions (Addendum)
Work on finger motion is much as possible, try to keep the arm elevated.  Pain medicine as directed.  Call the office if you are having trouble with medication.Marland Kitchen Dieterich   1) The drugs that you were given will stay in your system until tomorrow so for the next 24 hours you should not:  A) Drive an automobile B) Make any legal decisions C) Drink any alcoholic beverage   2) You may resume regular meals tomorrow.  Today it is better to start with liquids and gradually work up to solid foods.  You may eat anything you prefer, but it is better to start with liquids, then soup and crackers, and gradually work up to solid foods.   3) Please notify your doctor immediately if you have any unusual bleeding, trouble breathing, redness and pain at the surgery site, drainage, fever, or pain not relieved by medication.    4) Additional Instructions:        Please contact your physician with any problems or Same Day Surgery at [phone], Monday through Friday 6 am to 4 pm, or [place] at [facility] number at [phone].

## 2018-06-16 NOTE — Anesthesia Procedure Notes (Signed)
Procedure Name: LMA Insertion Date/Time: 06/16/2018 1:06 PM Performed by: Dionne Bucy, CRNA Pre-anesthesia Checklist: Patient identified, Patient being monitored, Timeout performed, Emergency Drugs available and Suction available Patient Re-evaluated:Patient Re-evaluated prior to induction Oxygen Delivery Method: Circle system utilized Preoxygenation: Pre-oxygenation with 100% oxygen Induction Type: IV induction Ventilation: Mask ventilation without difficulty LMA: LMA inserted LMA Size: 4.0 Tube type: Oral Number of attempts: 1 Placement Confirmation: positive ETCO2 and breath sounds checked- equal and bilateral Tube secured with: Tape Dental Injury: Teeth and Oropharynx as per pre-operative assessment

## 2018-06-16 NOTE — Anesthesia Post-op Follow-up Note (Signed)
Anesthesia QCDR form completed.        

## 2018-06-17 ENCOUNTER — Encounter: Payer: Self-pay | Admitting: Orthopedic Surgery

## 2018-06-19 ENCOUNTER — Other Ambulatory Visit: Payer: Self-pay | Admitting: Internal Medicine

## 2018-07-07 ENCOUNTER — Encounter: Payer: Self-pay | Admitting: Internal Medicine

## 2018-07-07 ENCOUNTER — Ambulatory Visit (INDEPENDENT_AMBULATORY_CARE_PROVIDER_SITE_OTHER): Payer: BC Managed Care – PPO | Admitting: Internal Medicine

## 2018-07-07 VITALS — BP 118/80 | HR 79 | Temp 97.5°F | Resp 16 | Ht 64.0 in | Wt 126.0 lb

## 2018-07-07 DIAGNOSIS — M810 Age-related osteoporosis without current pathological fracture: Secondary | ICD-10-CM | POA: Diagnosis not present

## 2018-07-07 DIAGNOSIS — H9319 Tinnitus, unspecified ear: Secondary | ICD-10-CM

## 2018-07-07 DIAGNOSIS — R739 Hyperglycemia, unspecified: Secondary | ICD-10-CM

## 2018-07-07 DIAGNOSIS — I10 Essential (primary) hypertension: Secondary | ICD-10-CM

## 2018-07-07 DIAGNOSIS — E7800 Pure hypercholesterolemia, unspecified: Secondary | ICD-10-CM

## 2018-07-07 DIAGNOSIS — C439 Malignant melanoma of skin, unspecified: Secondary | ICD-10-CM

## 2018-07-07 DIAGNOSIS — E039 Hypothyroidism, unspecified: Secondary | ICD-10-CM

## 2018-07-07 LAB — LDL CHOLESTEROL, DIRECT: Direct LDL: 103 mg/dL

## 2018-07-07 LAB — BASIC METABOLIC PANEL
BUN: 14 mg/dL (ref 6–23)
CO2: 27 mEq/L (ref 19–32)
Calcium: 9.6 mg/dL (ref 8.4–10.5)
Chloride: 104 mEq/L (ref 96–112)
Creatinine, Ser: 0.75 mg/dL (ref 0.40–1.20)
GFR: 84.21 mL/min (ref >60.00)
Glucose, Bld: 100 mg/dL — ABNORMAL HIGH (ref 70–99)
Potassium: 4.3 mEq/L (ref 3.5–5.1)
Sodium: 139 mEq/L (ref 135–145)

## 2018-07-07 LAB — CBC WITH DIFFERENTIAL/PLATELET
Basophils Absolute: 0 10*3/uL (ref 0.0–0.1)
Basophils Relative: 0.8 % (ref 0.0–3.0)
Eosinophils Absolute: 0.1 10*3/uL (ref 0.0–0.7)
Eosinophils Relative: 1.7 % (ref 0.0–5.0)
HCT: 38 % (ref 36.0–46.0)
Hemoglobin: 13 g/dL (ref 12.0–15.0)
Lymphocytes Relative: 23.6 % (ref 12.0–46.0)
Lymphs Abs: 1.4 10*3/uL (ref 0.7–4.0)
MCHC: 34.2 g/dL (ref 30.0–36.0)
MCV: 82.8 fl (ref 78.0–100.0)
Monocytes Absolute: 0.4 10*3/uL (ref 0.1–1.0)
Monocytes Relative: 6.2 % (ref 3.0–12.0)
Neutro Abs: 4 10*3/uL (ref 1.4–7.7)
Neutrophils Relative %: 67.7 % (ref 43.0–77.0)
Platelets: 323 10*3/uL (ref 150.0–400.0)
RBC: 4.6 Mil/uL (ref 3.87–5.11)
RDW: 12.7 % (ref 11.5–15.5)
WBC: 5.9 10*3/uL (ref 4.0–10.5)

## 2018-07-07 LAB — LIPID PANEL
Cholesterol: 217 mg/dL — ABNORMAL HIGH (ref 0–200)
HDL: 44.5 mg/dL (ref >39.00)
NonHDL: 172.52
Total CHOL/HDL Ratio: 5
Triglycerides: 359 mg/dL — ABNORMAL HIGH (ref 0.0–149.0)
VLDL: 71.8 mg/dL — ABNORMAL HIGH (ref 0.0–40.0)

## 2018-07-07 LAB — HEPATIC FUNCTION PANEL
ALT: 22 U/L (ref 0–35)
AST: 22 U/L (ref 0–37)
Albumin: 4.6 g/dL (ref 3.5–5.2)
Alkaline Phosphatase: 84 U/L (ref 39–117)
Bilirubin, Direct: 0.1 mg/dL (ref 0.0–0.3)
Total Bilirubin: 0.5 mg/dL (ref 0.2–1.2)
Total Protein: 7.8 g/dL (ref 6.0–8.3)

## 2018-07-07 LAB — VITAMIN D 25 HYDROXY (VIT D DEFICIENCY, FRACTURES): VITD: 42.58 ng/mL (ref 30.00–100.00)

## 2018-07-07 LAB — HEMOGLOBIN A1C: Hgb A1c MFr Bld: 5.9 % (ref 4.6–6.5)

## 2018-07-07 NOTE — Patient Instructions (Signed)
flonase nasal spray - 2 sprays each nostril one time per day.  Do this in the evening.    Afrin nasal spray - 2 sprays each nostril two times per day for three days and then stop.
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
## 2018-07-08 ENCOUNTER — Encounter: Payer: Self-pay | Admitting: Internal Medicine

## 2018-07-10 DIAGNOSIS — H9319 Tinnitus, unspecified ear: Secondary | ICD-10-CM | POA: Insufficient documentation

## 2018-07-10 NOTE — Assessment & Plan Note (Signed)
Low cholesterol diet and exercise.  Follow lipid panel.   

## 2018-07-10 NOTE — Assessment & Plan Note (Signed)
Followed by dermatology

## 2018-07-10 NOTE — Assessment & Plan Note (Addendum)
Intermittent ringing in her ears since her surgery.  Treat with afrin and flonase.  Call with update.  If persistent ringing, may need ENT evaluation.

## 2018-07-10 NOTE — Assessment & Plan Note (Signed)
Blood pressure under good control.  Continue same medication regimen.  Follow pressures.  Follow metabolic panel.   

## 2018-07-10 NOTE — Assessment & Plan Note (Signed)
On thyroid replacement.  Follow tsh.  
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
## 2018-08-13 ENCOUNTER — Other Ambulatory Visit: Payer: Self-pay | Admitting: Internal Medicine

## 2018-08-13 DIAGNOSIS — Z1231 Encounter for screening mammogram for malignant neoplasm of breast: Secondary | ICD-10-CM

## 2018-08-18 ENCOUNTER — Ambulatory Visit: Payer: BC Managed Care – PPO | Attending: Orthopedic Surgery

## 2018-08-18 ENCOUNTER — Other Ambulatory Visit: Payer: Self-pay

## 2018-08-18 ENCOUNTER — Telehealth: Payer: Self-pay

## 2018-08-18 DIAGNOSIS — M25622 Stiffness of left elbow, not elsewhere classified: Secondary | ICD-10-CM | POA: Diagnosis present

## 2018-08-18 DIAGNOSIS — M7989 Other specified soft tissue disorders: Secondary | ICD-10-CM | POA: Insufficient documentation

## 2018-08-18 NOTE — Telephone Encounter (Signed)
Per phone call conversation with Gerald Stabs (PA) from Dr. Theodore Demark office, pt can perform AROM at shoulder, elbow, and wrist, can perform submax isometrics at elbow and shoulder, can work on elbow flexion (can go greater than 10 degrees per week; as much as she can tolerate). Pt currently 9 weeks post op with elbow ORIF.

## 2018-08-18 NOTE — Patient Instructions (Signed)
   Prop your left arm up with a pillow and let your forearm comfortably extend with gravity assist  5 minutes at a time
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
## 2018-08-23 ENCOUNTER — Ambulatory Visit: Payer: BC Managed Care – PPO | Attending: Orthopedic Surgery

## 2018-08-23 DIAGNOSIS — M25622 Stiffness of left elbow, not elsewhere classified: Secondary | ICD-10-CM | POA: Insufficient documentation

## 2018-08-23 DIAGNOSIS — M7989 Other specified soft tissue disorders: Secondary | ICD-10-CM | POA: Diagnosis present

## 2018-08-23 NOTE — Patient Instructions (Signed)
  Pt was recommended to massage her L biceps when performing her L elbow extension stretch. Pt demonstrated and verbalized understanding.
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
## 2018-08-25 ENCOUNTER — Ambulatory Visit: Payer: BC Managed Care – PPO

## 2018-08-25 DIAGNOSIS — M25622 Stiffness of left elbow, not elsewhere classified: Secondary | ICD-10-CM

## 2018-08-25 DIAGNOSIS — M7989 Other specified soft tissue disorders: Secondary | ICD-10-CM

## 2018-08-25 NOTE — Patient Instructions (Addendum)
     Supine L elbow extension stretch with 1 lb weight at wrist 10x10 seconds for 3 sets. Reviewed and given as part of her HEP. Pt demonstrated and verbalized understanding.
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
## 2018-08-31 ENCOUNTER — Ambulatory Visit: Payer: BC Managed Care – PPO

## 2018-08-31 DIAGNOSIS — M7989 Other specified soft tissue disorders: Secondary | ICD-10-CM

## 2018-08-31 DIAGNOSIS — M25622 Stiffness of left elbow, not elsewhere classified: Secondary | ICD-10-CM
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
## 2018-09-02 ENCOUNTER — Ambulatory Visit: Payer: BC Managed Care – PPO

## 2018-09-02 DIAGNOSIS — M25622 Stiffness of left elbow, not elsewhere classified: Secondary | ICD-10-CM

## 2018-09-02 DIAGNOSIS — M7989 Other specified soft tissue disorders: Secondary | ICD-10-CM
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
## 2018-09-03 ENCOUNTER — Other Ambulatory Visit: Payer: Self-pay | Admitting: Internal Medicine

## 2018-09-07 ENCOUNTER — Ambulatory Visit: Payer: BC Managed Care – PPO

## 2018-09-07 ENCOUNTER — Ambulatory Visit
Admission: RE | Admit: 2018-09-07 | Discharge: 2018-09-07 | Disposition: A | Discharge status: Home / Self Care | Payer: BC Managed Care – PPO | Source: Ambulatory Visit | Attending: Internal Medicine | Admitting: Internal Medicine

## 2018-09-07 DIAGNOSIS — M25622 Stiffness of left elbow, not elsewhere classified: Secondary | ICD-10-CM

## 2018-09-07 DIAGNOSIS — Z1231 Encounter for screening mammogram for malignant neoplasm of breast: Secondary | ICD-10-CM | POA: Diagnosis present

## 2018-09-07 DIAGNOSIS — M7989 Other specified soft tissue disorders: Secondary | ICD-10-CM
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
## 2018-09-08 ENCOUNTER — Ambulatory Visit: Payer: BC Managed Care – PPO

## 2018-09-14 ENCOUNTER — Ambulatory Visit: Payer: BC Managed Care – PPO

## 2018-09-14 DIAGNOSIS — M7989 Other specified soft tissue disorders: Secondary | ICD-10-CM

## 2018-09-14 DIAGNOSIS — M25622 Stiffness of left elbow, not elsewhere classified: Secondary | ICD-10-CM
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
## 2018-09-15 ENCOUNTER — Ambulatory Visit: Payer: BC Managed Care – PPO

## 2018-09-15 DIAGNOSIS — M25622 Stiffness of left elbow, not elsewhere classified: Secondary | ICD-10-CM | POA: Diagnosis not present

## 2018-09-15 DIAGNOSIS — M7989 Other specified soft tissue disorders: Secondary | ICD-10-CM
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
## 2018-09-21 ENCOUNTER — Ambulatory Visit: Payer: BC Managed Care – PPO | Attending: Orthopedic Surgery

## 2018-09-21 DIAGNOSIS — M25622 Stiffness of left elbow, not elsewhere classified: Secondary | ICD-10-CM | POA: Diagnosis present

## 2018-09-21 DIAGNOSIS — M7989 Other specified soft tissue disorders: Secondary | ICD-10-CM | POA: Diagnosis present
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
## 2018-09-28 ENCOUNTER — Ambulatory Visit: Payer: BC Managed Care – PPO

## 2018-09-30 ENCOUNTER — Ambulatory Visit: Payer: BC Managed Care – PPO

## 2018-09-30 ENCOUNTER — Other Ambulatory Visit: Payer: Self-pay | Admitting: Internal Medicine

## 2018-09-30 DIAGNOSIS — M25622 Stiffness of left elbow, not elsewhere classified: Secondary | ICD-10-CM

## 2018-09-30 DIAGNOSIS — M7989 Other specified soft tissue disorders: Secondary | ICD-10-CM
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
## 2018-10-05 ENCOUNTER — Ambulatory Visit: Payer: BC Managed Care – PPO

## 2018-10-05 DIAGNOSIS — M7989 Other specified soft tissue disorders: Secondary | ICD-10-CM

## 2018-10-05 DIAGNOSIS — M25622 Stiffness of left elbow, not elsewhere classified: Secondary | ICD-10-CM | POA: Diagnosis not present
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
## 2018-10-11 ENCOUNTER — Ambulatory Visit: Payer: BC Managed Care – PPO

## 2018-10-11 DIAGNOSIS — M7989 Other specified soft tissue disorders: Secondary | ICD-10-CM

## 2018-10-11 DIAGNOSIS — M25622 Stiffness of left elbow, not elsewhere classified: Secondary | ICD-10-CM | POA: Diagnosis not present
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
## 2018-11-05 ENCOUNTER — Other Ambulatory Visit: Payer: Self-pay | Admitting: Internal Medicine

## 2018-11-10 ENCOUNTER — Ambulatory Visit (INDEPENDENT_AMBULATORY_CARE_PROVIDER_SITE_OTHER): Payer: BC Managed Care – PPO | Admitting: Internal Medicine

## 2018-11-10 ENCOUNTER — Encounter: Payer: Self-pay | Admitting: Internal Medicine

## 2018-11-10 VITALS — BP 118/78 | HR 74 | Temp 97.6°F | Resp 16 | Wt 126.8 lb

## 2018-11-10 DIAGNOSIS — E7800 Pure hypercholesterolemia, unspecified: Secondary | ICD-10-CM | POA: Diagnosis not present

## 2018-11-10 DIAGNOSIS — I10 Essential (primary) hypertension: Secondary | ICD-10-CM | POA: Diagnosis not present

## 2018-11-10 DIAGNOSIS — E039 Hypothyroidism, unspecified: Secondary | ICD-10-CM | POA: Diagnosis not present

## 2018-11-10 DIAGNOSIS — R739 Hyperglycemia, unspecified: Secondary | ICD-10-CM | POA: Insufficient documentation

## 2018-11-10 DIAGNOSIS — H9319 Tinnitus, unspecified ear: Secondary | ICD-10-CM

## 2018-11-10 DIAGNOSIS — C439 Malignant melanoma of skin, unspecified: Secondary | ICD-10-CM

## 2018-11-10 LAB — HEPATIC FUNCTION PANEL
ALT: 27 U/L (ref 0–35)
AST: 26 U/L (ref 0–37)
Albumin: 4.6 g/dL (ref 3.5–5.2)
Alkaline Phosphatase: 50 U/L (ref 39–117)
Bilirubin, Direct: 0.1 mg/dL (ref 0.0–0.3)
Total Bilirubin: 0.6 mg/dL (ref 0.2–1.2)
Total Protein: 7.7 g/dL (ref 6.0–8.3)

## 2018-11-10 LAB — LIPID PANEL
Cholesterol: 231 mg/dL — ABNORMAL HIGH (ref 0–200)
HDL: 46.6 mg/dL (ref >39.00)
NonHDL: 184.47
Total CHOL/HDL Ratio: 5
Triglycerides: 344 mg/dL — ABNORMAL HIGH (ref 0.0–149.0)
VLDL: 68.8 mg/dL — ABNORMAL HIGH (ref 0.0–40.0)

## 2018-11-10 LAB — TSH: TSH: 1.78 u[IU]/mL (ref 0.35–4.50)

## 2018-11-10 LAB — BASIC METABOLIC PANEL
BUN: 12 mg/dL (ref 6–23)
CO2: 28 mEq/L (ref 19–32)
Calcium: 9.9 mg/dL (ref 8.4–10.5)
Chloride: 103 mEq/L (ref 96–112)
Creatinine, Ser: 0.82 mg/dL (ref 0.40–1.20)
GFR: 71.4 mL/min (ref >60.00)
Glucose, Bld: 101 mg/dL — ABNORMAL HIGH (ref 70–99)
Potassium: 4.7 mEq/L (ref 3.5–5.1)
Sodium: 139 mEq/L (ref 135–145)

## 2018-11-10 LAB — HEMOGLOBIN A1C: Hgb A1c MFr Bld: 5.9 % (ref 4.6–6.5)

## 2018-11-10 LAB — LDL CHOLESTEROL, DIRECT: Direct LDL: 114 mg/dL

## 2018-11-10 NOTE — Assessment & Plan Note (Signed)
Persistent intermittent ringing in her ears.  This has been present since her surgery.  Some right ear fullness over the last two weeks.  Afrin nasal spray for 3 days.  Flonase nasal spray as directed.  Refer to ENT.

## 2018-11-10 NOTE — Assessment & Plan Note (Signed)
Watching her diet.  Exercising.  Check met b and a1c.

## 2018-11-10 NOTE — Assessment & Plan Note (Signed)
On thyroid replacement.  Follow tsh.  

## 2018-11-10 NOTE — Assessment & Plan Note (Signed)
She is trying to watch her diet.  Is exercising.  Recheck lipid panel.

## 2018-11-10 NOTE — Progress Notes (Signed)
Patient ID: Kathryn Francis, female   DOB: Dec 14, 1960, 58 y.o.   MRN: [id number]   Subjective:    Patient ID: Kathryn Francis, female    DOB: [birth year], 58 y.o.   MRN: [id number]  HPI  Patient here for a physical.  She declined physical today, so changed appr to a follow up.  She reports she is doing well.  Completed physical therapy - left elbow.  Still doing exercises at home.  Still with limited rom.  Stays actvie.  Exercising.  No chest pain.  No sob.  No acid reflux.  No abdominal pain.  Bowels moving.  No urine change.  Persistent intermittent ringing in her ears.  Has been present since after her surgery, but does not occur everyday.  Does occur most days.  Over the last two weeks, describes right ear fullness.  Some minimal change in hearing since noticing the ear fullness.  No earache.  No headache.  No light headedness or dizziness.     Past Medical History:  Diagnosis Date  . Hypercholesterolemia   . Hypertension   . Hypothyroidism   . Melanoma ([place])   . Patella fracture    right   Past Surgical History:  Procedure Laterality Date  . [place], 2000  . ORIF ELBOW FRACTURE Left 06/16/2018   Procedure: OPEN REDUCTION INTERNAL FIXATION (ORIF) ELBOW/OLECRANON FRACTURE;  Surgeon: Hessie Knows, MD;  Location: [facility] ORS;  Service: Orthopedics;  Laterality: Left;   Family History  Problem Relation Age of Onset  . Hypertension Other        siblings  . Hypercholesterolemia Other        siblings  . Heart disease Other        parent  . Hypertension Other        parent  . Lupus Other        sister  . Breast cancer Neg Hx    Social History   Socioeconomic History  . Marital status: Married    Spouse name: Not on file  . Number of children: 2  . Years of education: Not on file  . Highest education level: Not on file  Occupational History  . Not on file  Social Needs  . Financial resource strain: Not on file  . Food insecurity:    Worry: Not on file   Inability: Not on file  . Transportation needs:    Medical: Not on file    Non-medical: Not on file  Tobacco Use  . Smoking status: Never Smoker  . Smokeless tobacco: Never Used  Substance and Sexual Activity  . Alcohol use: No    Alcohol/week: 0.0 standard drinks  . Drug use: No  . Sexual activity: Not on file  Lifestyle  . Physical activity:    Days per week: Not on file    Minutes per session: Not on file  . Stress: Not on file  Relationships  . Social connections:    Talks on phone: Not on file    Gets together: Not on file    Attends religious service: Not on file    Active member of club or organization: Not on file    Attends meetings of clubs or organizations: Not on file    Relationship status: Not on file  Other Topics Concern  . Not on file  Social History Narrative  . Not on file    Outpatient Encounter Medications as of 11/10/2018  Medication Sig  . alendronate (FOSAMAX) 70 MG  tablet Take 70 mg by mouth once a week.  Marland Kitchen atorvastatin (LIPITOR) 10 MG tablet TAKE 1 TABLET BY MOUTH ONCE A DAY  . levothyroxine (SYNTHROID, LEVOTHROID) 75 MCG tablet TAKE 1 TABLET BY MOUTH EVERY DAY  . metoprolol tartrate (LOPRESSOR) 25 MG tablet TAKE 1/2 TABLET BY MOUTH DAILY  . Multiple Vitamin (MULTIVITAMIN) tablet Take 1 tablet by mouth daily.  Marland Kitchen triamcinolone cream (KENALOG) 0.1 % Apply 1 application topically 2 (two) times daily.  Marland Kitchen zolpidem (AMBIEN) 5 MG tablet TAKE 1 TABLET BY MOUTH AT BEDTIME AS NEEDED   No facility-administered encounter medications on file as of 11/10/2018.     Review of Systems  Constitutional: Negative for appetite change and unexpected weight change.  HENT: Positive for tinnitus. Negative for congestion and sinus pressure.        Right ear fullness.    Respiratory: Negative for cough, chest tightness and shortness of breath.   Cardiovascular: Negative for chest pain, palpitations and leg swelling.  Gastrointestinal: Negative for abdominal pain,  diarrhea, nausea and vomiting.  Genitourinary: Negative for difficulty urinating and dysuria.  Musculoskeletal: Negative for joint swelling and myalgias.  Skin: Negative for color change and rash.  Neurological: Negative for dizziness, light-headedness and headaches.  Psychiatric/Behavioral: Negative for agitation and dysphoric mood.       Objective:    Physical Exam Constitutional:      General: She is not in acute distress.    Appearance: Normal appearance.  HENT:     Ears:     Comments: Question of minimal fluid posterior right TM.  No erythema.  No cerumen impaction.  Right ear clear.      Nose: Nose normal. No congestion.  Neck:     Musculoskeletal: Neck supple. No muscular tenderness.     Thyroid: No thyromegaly.  Cardiovascular:     Rate and Rhythm: Normal rate and regular rhythm.  Pulmonary:     Effort: No respiratory distress.     Breath sounds: Normal breath sounds. No wheezing.  Abdominal:     General: Bowel sounds are normal.     Palpations: Abdomen is soft.     Tenderness: There is no abdominal tenderness.  Musculoskeletal:        General: No swelling or tenderness.  Lymphadenopathy:     Cervical: No cervical adenopathy.  Skin:    Findings: No erythema or rash.  Neurological:     Mental Status: She is alert.  Psychiatric:        Mood and Affect: Mood normal.        Behavior: Behavior normal.     BP 118/78 (BP Location: Left Arm, Patient Position: Sitting, Cuff Size: Normal)   Pulse 74   Temp 97.6 F (36.4 C) (Oral)   Resp 16   Wt 126 lb 12.8 oz (57.5 kg)   SpO2 97%   BMI 21.77 kg/m  Wt Readings from Last 3 Encounters:  11/10/18 126 lb 12.8 oz (57.5 kg)  07/07/18 126 lb (57.2 kg)  06/16/18 128 lb 11.2 oz (58.4 kg)     Lab Results  Component Value Date   WBC 5.9 07/07/2018   HGB 13.0 07/07/2018   HCT 38.0 07/07/2018   PLT 323.0 07/07/2018   GLUCOSE 101 (H) 11/10/2018   CHOL 231 (H) 11/10/2018   TRIG 344.0 (H) 11/10/2018   HDL 46.60  11/10/2018   LDLDIRECT 114.0 11/10/2018   LDLCALC 110 (H) 05/05/2014   ALT 27 11/10/2018   AST 26 11/10/2018   NA  139 11/10/2018   K 4.7 11/10/2018   CL 103 11/10/2018   CREATININE 0.82 11/10/2018   BUN 12 11/10/2018   CO2 28 11/10/2018   TSH 1.78 11/10/2018   HGBA1C 5.9 11/10/2018    Mm 3d Screen Breast Bilateral  Result Date: 09/08/2018 CLINICAL DATA:  Screening. EXAM: DIGITAL SCREENING BILATERAL MAMMOGRAM WITH TOMO AND CAD COMPARISON:  Previous exam(s). ACR Breast Density Category b: There are scattered areas of fibroglandular density. FINDINGS: There are no findings suspicious for malignancy. Images were processed with CAD. IMPRESSION: No mammographic evidence of malignancy. A result letter of this screening mammogram will be mailed directly to the patient. RECOMMENDATION: Screening mammogram in one year. (Code:SM-B-01Y) BI-RADS CATEGORY  1: Negative. Electronically Signed   By: Ammie Ferrier M.D.   On: 09/08/2018 11:02       Assessment & Plan:   Problem List Items Addressed This Visit    Hypercholesterolemia - Primary    She is trying to watch her diet.  Is exercising.  Recheck lipid panel.        Relevant Orders   Hepatic function panel (Completed)   Lipid panel (Completed)   Hyperglycemia    Watching her diet.  Exercising.  Check met b and a1c.        Relevant Orders   Hemoglobin A1c (Completed)   Hypertension    Blood pressure under good control.  Continue same medication regimen.  Follow pressures.  Follow metabolic panel.        Relevant Orders   Basic metabolic panel (Completed)   Hypothyroidism    On thyroid replacement.  Follow tsh.        Relevant Orders   TSH (Completed)   Melanoma ([place])    Followed by dermatology.       Tinnitus    Persistent intermittent ringing in her ears.  This has been present since her surgery.  Some right ear fullness over the last two weeks.  Afrin nasal spray for 3 days.  Flonase nasal spray as directed.  Refer to  ENT.        Relevant Orders   Ambulatory referral to ENT       Einar Pheasant, MD

## 2018-11-10 NOTE — Assessment & Plan Note (Signed)
Blood pressure under good control.  Continue same medication regimen.  Follow pressures.  Follow metabolic panel.   

## 2018-11-10 NOTE — Assessment & Plan Note (Signed)
Followed by dermatology

## 2018-11-11 ENCOUNTER — Encounter: Payer: Self-pay | Admitting: Internal Medicine

## 2018-11-15 ENCOUNTER — Other Ambulatory Visit: Payer: Self-pay | Admitting: Internal Medicine

## 2018-11-17 NOTE — Telephone Encounter (Signed)
Melissa, I have placed the order for the referral to ENT.  Can you let Ms Kasprzak know where we stand with getting the appointment.    Thank you for your help.    Dr Nicki Reaper

## 2018-11-25 ENCOUNTER — Encounter: Payer: Self-pay | Admitting: Internal Medicine

## 2018-11-25 NOTE — Telephone Encounter (Signed)
LMTCB

## 2018-12-08 ENCOUNTER — Telehealth: Payer: Self-pay

## 2018-12-08 NOTE — Telephone Encounter (Signed)
Called and notified pt that ok to start hctz 12.5mg  q day.  Will follow pressures.  May need to hold metoprolol.  Plans to start after she gets back from her cruise.  Will call and notify me if needs labs here or if ENT will check.  Will need metb in 3-4 weeks.

## 2018-12-08 NOTE — Telephone Encounter (Signed)
Received fax from [facility] stating that pt was seen for tinnitus and would like to start pt on HCTZ 12.5 mg q day to see if it helps. They discussed possible Meniere's. They are requesting clearance to start med. They have already sent in rx but pt is waiting on clearance from [place]. I have called them to have office notes faxed over. They are also wanting patients recent labs so I will send those over to them as well

## 2018-12-22 ENCOUNTER — Other Ambulatory Visit: Payer: Self-pay | Admitting: Internal Medicine

## 2018-12-23 NOTE — Telephone Encounter (Signed)
Last OV 11/10/2018  Last refilled 09/06/2018 disp 30 with no refills   Sent to PCP to advise

## 2019-03-11 ENCOUNTER — Other Ambulatory Visit: Payer: Self-pay

## 2019-03-16 ENCOUNTER — Other Ambulatory Visit (HOSPITAL_COMMUNITY)
Admission: RE | Admit: 2019-03-16 | Discharge: 2019-03-16 | Disposition: A | Discharge status: Home / Self Care | Payer: BC Managed Care – PPO | Source: Ambulatory Visit | Attending: Internal Medicine | Admitting: Internal Medicine

## 2019-03-16 ENCOUNTER — Encounter: Payer: Self-pay | Admitting: Internal Medicine

## 2019-03-16 ENCOUNTER — Other Ambulatory Visit: Payer: Self-pay

## 2019-03-16 ENCOUNTER — Ambulatory Visit (INDEPENDENT_AMBULATORY_CARE_PROVIDER_SITE_OTHER): Payer: BC Managed Care – PPO | Admitting: Internal Medicine

## 2019-03-16 VITALS — BP 108/70 | HR 83 | Temp 98.2°F | Resp 16 | Ht 64.0 in | Wt 128.2 lb

## 2019-03-16 DIAGNOSIS — R739 Hyperglycemia, unspecified: Secondary | ICD-10-CM

## 2019-03-16 DIAGNOSIS — E7800 Pure hypercholesterolemia, unspecified: Secondary | ICD-10-CM

## 2019-03-16 DIAGNOSIS — C439 Malignant melanoma of skin, unspecified: Secondary | ICD-10-CM

## 2019-03-16 DIAGNOSIS — Z0000 Encounter for general adult medical examination without abnormal findings: Secondary | ICD-10-CM | POA: Diagnosis not present

## 2019-03-16 DIAGNOSIS — I10 Essential (primary) hypertension: Secondary | ICD-10-CM | POA: Diagnosis not present

## 2019-03-16 DIAGNOSIS — H9319 Tinnitus, unspecified ear: Secondary | ICD-10-CM

## 2019-03-16 DIAGNOSIS — Z124 Encounter for screening for malignant neoplasm of cervix: Secondary | ICD-10-CM

## 2019-03-16 DIAGNOSIS — E039 Hypothyroidism, unspecified: Secondary | ICD-10-CM

## 2019-03-16 DIAGNOSIS — Z1211 Encounter for screening for malignant neoplasm of colon: Secondary | ICD-10-CM

## 2019-03-16 LAB — BASIC METABOLIC PANEL
BUN: 17 mg/dL (ref 6–23)
CO2: 30 mEq/L (ref 19–32)
Calcium: 9.3 mg/dL (ref 8.4–10.5)
Chloride: 102 mEq/L (ref 96–112)
Creatinine, Ser: 0.79 mg/dL (ref 0.40–1.20)
GFR: 74.45 mL/min (ref >60.00)
Glucose, Bld: 91 mg/dL (ref 70–99)
Potassium: 4.2 mEq/L (ref 3.5–5.1)
Sodium: 139 mEq/L (ref 135–145)

## 2019-03-16 LAB — LDL CHOLESTEROL, DIRECT: Direct LDL: 85 mg/dL

## 2019-03-16 LAB — LIPID PANEL
Cholesterol: 219 mg/dL — ABNORMAL HIGH (ref 0–200)
HDL: 42.7 mg/dL (ref >39.00)
Total CHOL/HDL Ratio: 5
Triglycerides: 403 mg/dL — ABNORMAL HIGH (ref 0.0–149.0)

## 2019-03-16 LAB — HEPATIC FUNCTION PANEL
ALT: 23 U/L (ref 0–35)
AST: 24 U/L (ref 0–37)
Albumin: 4.3 g/dL (ref 3.5–5.2)
Alkaline Phosphatase: 47 U/L (ref 39–117)
Bilirubin, Direct: 0.1 mg/dL (ref 0.0–0.3)
Total Bilirubin: 0.5 mg/dL (ref 0.2–1.2)
Total Protein: 7.4 g/dL (ref 6.0–8.3)

## 2019-03-16 LAB — HEMOGLOBIN A1C: Hgb A1c MFr Bld: 6.1 % (ref 4.6–6.5)

## 2019-03-16 MED ORDER — HYDROCHLOROTHIAZIDE 12.5 MG PO CAPS: 12.5000 mg | ORAL_CAPSULE | Freq: Every day | ORAL | 0 refills | Status: DC

## 2019-03-16 NOTE — Assessment & Plan Note (Signed)
Blood pressure doing well.  Tolerating hctz.  Follow pressures.  Follow metabolic panel.

## 2019-03-16 NOTE — Assessment & Plan Note (Signed)
Evaluated by ENT.  Recommended low dose hctz.  Taking and tolerating.  Tinnitus better.  Follow.

## 2019-03-16 NOTE — Assessment & Plan Note (Signed)
Low carb diet and exercise.  Follow met b and a1c.   

## 2019-03-16 NOTE — Assessment & Plan Note (Signed)
Followed by dermatology.  Sates everything checked out ok.  Follow.

## 2019-03-16 NOTE — Assessment & Plan Note (Signed)
Low cholesterol diet and exercise.  Follow lipid panel.   

## 2019-03-16 NOTE — Progress Notes (Signed)
Patient ID: Kathryn Francis, female   DOB: [birth year], 58 y.o.   MRN: [id number]   Subjective:    Patient ID: Kathryn Francis, female    DOB: Jun 27, 1961, 58 y.o.   MRN: [id number]  HPI  Patient here for her physical exam.  She reports she is doing well. Exercising.  No chest pain.  No sob.  No acid reflux. No abdominal pain.  Bowels moving.  Still some limitations with her left arm - unable to fully extend.  Still doing her exercises.  No significant pain in her arm.  Overall she feels good.  Still watching what she eats.  Had persistent tinnitus.  Evaluated by ENT.  On hctz.  Better.  Follow.     Past Medical History:  Diagnosis Date  . Hypercholesterolemia   . Hypertension   . Hypothyroidism   . Melanoma ([place])   . Patella fracture    right   Past Surgical History:  Procedure Laterality Date  . [place], 2000  . ORIF ELBOW FRACTURE Left 06/16/2018   Procedure: OPEN REDUCTION INTERNAL FIXATION (ORIF) ELBOW/OLECRANON FRACTURE;  Surgeon: Hessie Knows, MD;  Location: [facility] ORS;  Service: Orthopedics;  Laterality: Left;   Family History  Problem Relation Age of Onset  . Hypertension Other        siblings  . Hypercholesterolemia Other        siblings  . Heart disease Other        parent  . Hypertension Other        parent  . Lupus Other        sister  . Breast cancer Neg Hx    Social History   Socioeconomic History  . Marital status: Married    Spouse name: Not on file  . Number of children: 2  . Years of education: Not on file  . Highest education level: Not on file  Occupational History  . Not on file  Social Needs  . Financial resource strain: Not on file  . Food insecurity:    Worry: Not on file    Inability: Not on file  . Transportation needs:    Medical: Not on file    Non-medical: Not on file  Tobacco Use  . Smoking status: Never Smoker  . Smokeless tobacco: Never Used  Substance and Sexual Activity  . Alcohol use: No    Alcohol/week: 0.0  standard drinks  . Drug use: No  . Sexual activity: Not on file  Lifestyle  . Physical activity:    Days per week: Not on file    Minutes per session: Not on file  . Stress: Not on file  Relationships  . Social connections:    Talks on phone: Not on file    Gets together: Not on file    Attends religious service: Not on file    Active member of club or organization: Not on file    Attends meetings of clubs or organizations: Not on file    Relationship status: Not on file  Other Topics Concern  . Not on file  Social History Narrative  . Not on file    Outpatient Encounter Medications as of 03/16/2019  Medication Sig  . alendronate (FOSAMAX) 70 MG tablet Take 70 mg by mouth once a week.  Marland Kitchen atorvastatin (LIPITOR) 10 MG tablet TAKE 1 TABLET BY MOUTH ONCE A DAY  . levothyroxine (SYNTHROID, LEVOTHROID) 75 MCG tablet TAKE 1 TABLET BY MOUTH EVERY DAY  . metoprolol tartrate (  LOPRESSOR) 25 MG tablet TAKE 1/2 TABLET BY MOUTH DAILY  . Multiple Vitamin (MULTIVITAMIN) tablet Take 1 tablet by mouth daily.  Marland Kitchen triamcinolone cream (KENALOG) 0.1 % Apply 1 application topically 2 (two) times daily.  Marland Kitchen zolpidem (AMBIEN) 5 MG tablet TAKE 1 TABLET BY MOUTH EVERY DAY AT BEDTIME AS NEEDED  . hydrochlorothiazide (MICROZIDE) 12.5 MG capsule Take 1 capsule (12.5 mg total) by mouth daily.   No facility-administered encounter medications on file as of 03/16/2019.     Review of Systems  Constitutional: Negative for appetite change and unexpected weight change.  HENT: Negative for congestion and sinus pressure.   Eyes: Negative for pain and visual disturbance.  Respiratory: Negative for cough, chest tightness and shortness of breath.   Cardiovascular: Negative for chest pain, palpitations and leg swelling.  Gastrointestinal: Negative for abdominal pain, diarrhea, nausea and vomiting.  Genitourinary: Negative for difficulty urinating and dysuria.  Musculoskeletal: Negative for joint swelling and myalgias.   Skin: Negative for color change and rash.  Neurological: Negative for dizziness, light-headedness and headaches.  Hematological: Negative for adenopathy. Does not bruise/bleed easily.  Psychiatric/Behavioral: Negative for agitation and dysphoric mood.       Objective:    Physical Exam Constitutional:      General: She is not in acute distress.    Appearance: Normal appearance. She is well-developed.  HENT:     Right Ear: External ear normal. There is no impacted cerumen.     Left Ear: External ear normal. There is no impacted cerumen.  Eyes:     General: No scleral icterus.       Right eye: No discharge.        Left eye: No discharge.     Conjunctiva/sclera: Conjunctivae normal.  Neck:     Musculoskeletal: Neck supple. No muscular tenderness.     Thyroid: No thyromegaly.  Cardiovascular:     Rate and Rhythm: Normal rate and regular rhythm.  Pulmonary:     Effort: No tachypnea, accessory muscle usage or respiratory distress.     Breath sounds: Normal breath sounds. No decreased breath sounds or wheezing.  Chest:     Breasts:        Right: No inverted nipple, mass, nipple discharge or tenderness (no axillary adenopathy).        Left: No inverted nipple, mass, nipple discharge or tenderness (no axilarry adenopathy).  Abdominal:     General: Bowel sounds are normal.     Palpations: Abdomen is soft.     Tenderness: There is no abdominal tenderness.  Genitourinary:    Comments: Normal external genitalia.  Vaginal vault without lesions.  Cervix identified.  Pap smear performed.  Could not appreciate any adnexal masses or tenderness.   Musculoskeletal:        General: No swelling or tenderness.  Lymphadenopathy:     Cervical: No cervical adenopathy.  Skin:    Findings: No erythema or rash.  Neurological:     Mental Status: She is alert and oriented to person, place, and time.  Psychiatric:        Mood and Affect: Mood normal.        Behavior: Behavior normal.    BP 108/70    Pulse 83   Temp 98.2 F (36.8 C) (Oral)   Resp 16   Ht '5\' 4"'$  (1.626 m)   Wt 128 lb 3.2 oz (58.2 kg)   SpO2 98%   BMI 22.01 kg/m  Wt Readings from Last 3 Encounters:  03/16/19  128 lb 3.2 oz (58.2 kg)  11/10/18 126 lb 12.8 oz (57.5 kg)  07/07/18 126 lb (57.2 kg)     Lab Results  Component Value Date   WBC 5.9 07/07/2018   HGB 13.0 07/07/2018   HCT 38.0 07/07/2018   PLT 323.0 07/07/2018   GLUCOSE 91 03/16/2019   CHOL 219 (H) 03/16/2019   TRIG (H) 03/16/2019    403.0 Triglyceride is over 400; calculations on Lipids are invalid.   HDL 42.70 03/16/2019   LDLDIRECT 85.0 03/16/2019   LDLCALC 110 (H) 05/05/2014   ALT 23 03/16/2019   AST 24 03/16/2019   NA 139 03/16/2019   K 4.2 03/16/2019   CL 102 03/16/2019   CREATININE 0.79 03/16/2019   BUN 17 03/16/2019   CO2 30 03/16/2019   TSH 1.78 11/10/2018   HGBA1C 6.1 03/16/2019    Mm 3d Screen Breast Bilateral  Result Date: 09/08/2018 CLINICAL DATA:  Screening. EXAM: DIGITAL SCREENING BILATERAL MAMMOGRAM WITH TOMO AND CAD COMPARISON:  Previous exam(s). ACR Breast Density Category b: There are scattered areas of fibroglandular density. FINDINGS: There are no findings suspicious for malignancy. Images were processed with CAD. IMPRESSION: No mammographic evidence of malignancy. A result letter of this screening mammogram will be mailed directly to the patient. RECOMMENDATION: Screening mammogram in one year. (Code:SM-B-01Y) BI-RADS CATEGORY  1: Negative. Electronically Signed   By: Ammie Ferrier M.D.   On: 09/08/2018 11:02       Assessment & Plan:   Problem List Items Addressed This Visit    Health care maintenance    Physical today 03/16/19.  PAP 03/16/19.  Colonoscopy 06/2013.  Recommended f/u in 2024.  Mammogram 09/08/18 - Birads I.  Hemoccult cards given.        Hypercholesterolemia    Low cholesterol diet and exercise.  Follow lipid panel.        Relevant Medications   hydrochlorothiazide (MICROZIDE) 12.5 MG capsule    Other Relevant Orders   Hepatic function panel (Completed)   Lipid panel (Completed)   Hyperglycemia    Low carb diet and exercise.  Follow met b and a1c.        Relevant Orders   Hemoglobin A1c (Completed)   Hypertension    Blood pressure doing well.  Tolerating hctz.  Follow pressures.  Follow metabolic panel.        Relevant Medications   hydrochlorothiazide (MICROZIDE) 12.5 MG capsule   Other Relevant Orders   Basic metabolic panel (Completed)   Hypothyroidism    On thyroid replacement.  Follow tsh.       Melanoma ([place])    Followed by dermatology.  Sates everything checked out ok.  Follow.       Tinnitus    Evaluated by ENT.  Recommended low dose hctz.  Taking and tolerating.  Tinnitus better.  Follow.         Other Visit Diagnoses    Cervical cancer screening    -  Primary   Relevant Orders   Cytology - PAP( [place])   Colon cancer screening       Relevant Orders   Fecal occult blood, imunochemical       Einar Pheasant, MD

## 2019-03-16 NOTE — Assessment & Plan Note (Addendum)
Physical today 03/16/19.  PAP 03/16/19.  Colonoscopy 06/2013.  Recommended f/u in 2024.  Mammogram 09/08/18 - Birads I.  Hemoccult cards given.

## 2019-03-16 NOTE — Assessment & Plan Note (Signed)
On thyroid replacement.  Follow tsh.  

## 2019-03-18 LAB — CYTOLOGY - PAP
Diagnosis: NEGATIVE
HPV: NOT DETECTED

## 2019-03-18 NOTE — Telephone Encounter (Signed)
Please send pt a copy of the [facility] Lipid diet.  I have sent her a my chart stating you were going to send to her. Thanks

## 2019-03-18 NOTE — Telephone Encounter (Signed)
Mailed to pt

## 2019-03-20 ENCOUNTER — Encounter: Payer: Self-pay | Admitting: Internal Medicine

## 2019-03-23 ENCOUNTER — Other Ambulatory Visit: Payer: Self-pay | Admitting: Internal Medicine

## 2019-03-23 NOTE — Telephone Encounter (Signed)
Please send [facility] lipid diet if not already sent.  Also notify pt when sent.  Thanks

## 2019-03-25 NOTE — Telephone Encounter (Signed)
Last OV 03/16/2019 Next OV 09/28/19 Last refill 12/23/2018

## 2019-03-29 ENCOUNTER — Other Ambulatory Visit (INDEPENDENT_AMBULATORY_CARE_PROVIDER_SITE_OTHER): Payer: BC Managed Care – PPO

## 2019-03-29 ENCOUNTER — Encounter: Payer: Self-pay | Admitting: Internal Medicine

## 2019-03-29 DIAGNOSIS — Z1211 Encounter for screening for malignant neoplasm of colon: Secondary | ICD-10-CM | POA: Diagnosis not present

## 2019-03-29 LAB — FECAL OCCULT BLOOD, IMMUNOCHEMICAL: Fecal Occult Bld: NEGATIVE

## 2019-04-07 ENCOUNTER — Other Ambulatory Visit: Payer: Self-pay | Admitting: Internal Medicine

## 2019-04-21 ENCOUNTER — Other Ambulatory Visit: Payer: Self-pay | Admitting: Internal Medicine

## 2019-04-24 ENCOUNTER — Encounter: Payer: Self-pay | Admitting: Internal Medicine

## 2019-04-25 ENCOUNTER — Other Ambulatory Visit: Payer: Self-pay

## 2019-04-25 MED ORDER — LEVOTHYROXINE SODIUM 75 MCG PO TABS: 75.0000 ug | ORAL_TABLET | Freq: Every day | ORAL | 0 refills | Status: DC

## 2019-07-02 ENCOUNTER — Other Ambulatory Visit: Payer: Self-pay | Admitting: Internal Medicine

## 2019-07-06 NOTE — Telephone Encounter (Signed)
Refilled: 03/26/2019 Last OV: 03/16/2019 Next OV: 09/28/2019

## 2019-07-21 ENCOUNTER — Ambulatory Visit: Payer: Self-pay

## 2019-07-21 DIAGNOSIS — Z23 Encounter for immunization: Secondary | ICD-10-CM

## 2019-08-11 ENCOUNTER — Other Ambulatory Visit: Payer: Self-pay | Admitting: Internal Medicine

## 2019-08-11 DIAGNOSIS — Z1231 Encounter for screening mammogram for malignant neoplasm of breast: Secondary | ICD-10-CM

## 2019-08-20 ENCOUNTER — Encounter: Payer: Self-pay | Admitting: Internal Medicine

## 2019-08-22 ENCOUNTER — Telehealth: Payer: Self-pay | Admitting: Internal Medicine

## 2019-08-22 ENCOUNTER — Other Ambulatory Visit: Payer: Self-pay

## 2019-08-22 ENCOUNTER — Ambulatory Visit (INDEPENDENT_AMBULATORY_CARE_PROVIDER_SITE_OTHER): Payer: BC Managed Care – PPO | Admitting: Internal Medicine

## 2019-08-22 DIAGNOSIS — U071 COVID-19: Secondary | ICD-10-CM | POA: Diagnosis not present

## 2019-08-22 MED ORDER — BENZONATATE 100 MG PO CAPS: 100.0000 mg | ORAL_CAPSULE | Freq: Three times a day (TID) | ORAL | 0 refills | Status: DC | PRN

## 2019-08-22 NOTE — Telephone Encounter (Signed)
My chart message sent to pt for update.   

## 2019-08-22 NOTE — Telephone Encounter (Signed)
Pt scheduled for virtual today.  

## 2019-08-22 NOTE — Progress Notes (Signed)
Patient ID: Kathryn Francis, female   DOB: 1961-04-20, 58 y.o.   MRN: [id number]   Virtual Visit via video Note  This visit type was conducted due to national recommendations for restrictions regarding the COVID-19 pandemic (e.g. social distancing).  This format is felt to be most appropriate for this patient at this time.  All issues noted in this document were discussed and addressed.  No physical exam was performed (except for noted visual exam findings with Video Visits).   I connected with Kathryn Francis by a video enabled telemedicine application and verified that I am speaking with the correct person using two identifiers. Location patient: home Location provider: work Persons participating in the virtual visit: patient, provider  I discussed the limitations, risks, security and privacy concerns of performing an evaluation and management service by video and the availability of in person appointments. The patient expressed understanding and agreed to proceed.   Reason for visit: work in appt  HPI: Work in [employer] for covid.  Her daughter is positive, husband tested positive and she went and was tested 08/20/19.  Her test returned positive as well.  States symptoms started 08/16/19.  Had low grade fever 100.7.  Some headache.  Then developed some cough, scratchy throat and chest congestion.  No fever now.  Is having increased cough.  Would like something to help with cough.  No sob, chest pain or chest tightness.  Eating.  No vomiting or diarrhea.  Has self quarantined.    ROS: See pertinent positives and negatives per HPI.  Past Medical History:  Diagnosis Date  . Hypercholesterolemia   . Hypertension   . Hypothyroidism   . Melanoma ([place])   . Patella fracture    right    Past Surgical History:  Procedure Laterality Date  . [place], 2000  . ORIF ELBOW FRACTURE Left 06/16/2018   Procedure: OPEN REDUCTION INTERNAL FIXATION (ORIF) ELBOW/OLECRANON FRACTURE;  Surgeon:  Kathryn Knows, MD;  Location: [facility] ORS;  Service: Orthopedics;  Laterality: Left;    Family History  Problem Relation Age of Onset  . Hypertension Other        siblings  . Hypercholesterolemia Other        siblings  . Heart disease Other        parent  . Hypertension Other        parent  . Lupus Other        sister  . Breast cancer Neg Hx     SOCIAL HX: reviewed.    Current Outpatient Medications:  .  alendronate (FOSAMAX) 70 MG tablet, Take 70 mg by mouth once a week., Disp: , Rfl: 11 .  atorvastatin (LIPITOR) 10 MG tablet, TAKE 1 TABLET BY MOUTH EVERY DAY, Disp: 90 tablet, Rfl: 1 .  hydrochlorothiazide (MICROZIDE) 12.5 MG capsule, Take 1 capsule (12.5 mg total) by mouth daily., Disp: 30 capsule, Rfl: 0 .  levothyroxine (SYNTHROID) 75 MCG tablet, Take 1 tablet (75 mcg total) by mouth daily., Disp: 30 tablet, Rfl: 0 .  metoprolol tartrate (LOPRESSOR) 25 MG tablet, TAKE 1/2 TABLET BY MOUTH EVERY DAY, Disp: 45 tablet, Rfl: 1 .  Multiple Vitamin (MULTIVITAMIN) tablet, Take 1 tablet by mouth daily., Disp: , Rfl:  .  triamcinolone cream (KENALOG) 0.1 %, Apply 1 application topically 2 (two) times daily., Disp: 30 g, Rfl: 0 .  zolpidem (AMBIEN) 5 MG tablet, TAKE 1 TABLET BY MOUTH EVERY DAY AT BEDTIME AS NEEDED, Disp: 30 tablet, Rfl: 0  EXAM:  GENERAL: alert, oriented, appears well and in no acute distress  HEENT: atraumatic, conjunttiva clear, no obvious abnormalities on inspection of external nose and ears  NECK: normal movements of the head and neck  LUNGS: on inspection no signs of respiratory distress, breathing rate appears normal, no obvious gross SOB, gasping or wheezing  CV: no obvious cyanosis  PSYCH/NEURO: pleasant and cooperative, no obvious depression or anxiety, speech and thought processing grossly intact  ASSESSMENT AND PLAN:  Discussed the following assessment and plan:  No problem-specific Assessment & Plan notes found for this encounter.    I discussed  the assessment and treatment plan with the patient. The patient was provided an opportunity to ask questions and all were answered. The patient agreed with the plan and demonstrated an understanding of the instructions.   The patient was advised to call back or seek an in-person evaluation if the symptoms worsen or if the condition fails to improve as anticipated.   Einar Pheasant, MD

## 2019-08-28 ENCOUNTER — Encounter: Payer: Self-pay | Admitting: Internal Medicine

## 2019-08-28 DIAGNOSIS — U071 COVID-19: Secondary | ICD-10-CM | POA: Insufficient documentation

## 2019-08-28 NOTE — Assessment & Plan Note (Signed)
Tested 08/20/19.  Returned positive.  Symptoms started 08/16/19.  Discussed covid with her.  She is having increased cough.  No sob or chest pain/tightness.  Eating.  Treat symptoms. Tessalon perles to help with cough.  Robitussin/mucinex as directed.  nasacort if needed.  Follow closely.  Call with update. Discussed self quarantine.

## 2019-09-09 ENCOUNTER — Ambulatory Visit
Admission: RE | Admit: 2019-09-09 | Discharge: 2019-09-09 | Disposition: A | Discharge status: Home / Self Care | Payer: BC Managed Care – PPO | Source: Ambulatory Visit | Attending: Internal Medicine | Admitting: Internal Medicine

## 2019-09-09 DIAGNOSIS — Z1231 Encounter for screening mammogram for malignant neoplasm of breast: Secondary | ICD-10-CM | POA: Insufficient documentation

## 2019-09-22 ENCOUNTER — Encounter: Payer: Self-pay | Admitting: Internal Medicine

## 2019-09-23 ENCOUNTER — Telehealth: Payer: Self-pay | Admitting: *Deleted

## 2019-09-23 ENCOUNTER — Other Ambulatory Visit: Payer: Self-pay | Admitting: Internal Medicine

## 2019-09-23 NOTE — Telephone Encounter (Signed)
Pt rescheduled for labs and appt with PCP

## 2019-09-23 NOTE — Telephone Encounter (Signed)
Copied from [place] [id number]. Topic: General - Call Back - No Documentation >> Sep 23, 2019  1:22 PM Erick Blinks wrote: Pt returned trisha's call, called [facility] twice. Please advise  [phone]

## 2019-09-23 NOTE — Telephone Encounter (Signed)
LMTCB. Need to coordinate lab visit and visit with Dr Nicki Reaper. She can do fasting labs in the afternoon if she has light early lunch and come fasting >4-6hrs.

## 2019-09-28 ENCOUNTER — Ambulatory Visit: Payer: BC Managed Care – PPO | Admitting: Internal Medicine

## 2019-11-03 ENCOUNTER — Other Ambulatory Visit: Payer: Self-pay | Admitting: Internal Medicine

## 2019-11-03 ENCOUNTER — Telehealth: Payer: Self-pay | Admitting: *Deleted

## 2019-11-03 DIAGNOSIS — I10 Essential (primary) hypertension: Secondary | ICD-10-CM

## 2019-11-03 DIAGNOSIS — E7800 Pure hypercholesterolemia, unspecified: Secondary | ICD-10-CM

## 2019-11-03 DIAGNOSIS — E039 Hypothyroidism, unspecified: Secondary | ICD-10-CM

## 2019-11-03 DIAGNOSIS — R739 Hyperglycemia, unspecified: Secondary | ICD-10-CM

## 2019-11-03 NOTE — Progress Notes (Signed)
Order placed for f/u labs.  

## 2019-11-03 NOTE — Telephone Encounter (Signed)
Please place future orders for lab appt.  

## 2019-11-03 NOTE — Telephone Encounter (Signed)
Order placed for f/u labs.  

## 2019-11-07 ENCOUNTER — Other Ambulatory Visit: Payer: Self-pay

## 2019-11-07 ENCOUNTER — Other Ambulatory Visit (INDEPENDENT_AMBULATORY_CARE_PROVIDER_SITE_OTHER): Payer: BC Managed Care – PPO

## 2019-11-07 DIAGNOSIS — R739 Hyperglycemia, unspecified: Secondary | ICD-10-CM

## 2019-11-07 DIAGNOSIS — E039 Hypothyroidism, unspecified: Secondary | ICD-10-CM

## 2019-11-07 DIAGNOSIS — E7800 Pure hypercholesterolemia, unspecified: Secondary | ICD-10-CM

## 2019-11-07 DIAGNOSIS — I10 Essential (primary) hypertension: Secondary | ICD-10-CM

## 2019-11-07 LAB — HEPATIC FUNCTION PANEL
ALT: 25 U/L (ref 0–35)
AST: 24 U/L (ref 0–37)
Albumin: 4.5 g/dL (ref 3.5–5.2)
Alkaline Phosphatase: 41 U/L (ref 39–117)
Bilirubin, Direct: 0.1 mg/dL (ref 0.0–0.3)
Total Bilirubin: 0.3 mg/dL (ref 0.2–1.2)
Total Protein: 7.7 g/dL (ref 6.0–8.3)

## 2019-11-07 LAB — CBC WITH DIFFERENTIAL/PLATELET
Basophils Absolute: 0.1 10*3/uL (ref 0.0–0.1)
Basophils Relative: 1.1 % (ref 0.0–3.0)
Eosinophils Absolute: 0.1 10*3/uL (ref 0.0–0.7)
Eosinophils Relative: 3.1 % (ref 0.0–5.0)
HCT: 40.9 % (ref 36.0–46.0)
Hemoglobin: 13.8 g/dL (ref 12.0–15.0)
Lymphocytes Relative: 40.5 % (ref 12.0–46.0)
Lymphs Abs: 1.9 10*3/uL (ref 0.7–4.0)
MCHC: 33.8 g/dL (ref 30.0–36.0)
MCV: 84.2 fl (ref 78.0–100.0)
Monocytes Absolute: 0.4 10*3/uL (ref 0.1–1.0)
Monocytes Relative: 8.2 % (ref 3.0–12.0)
Neutro Abs: 2.3 10*3/uL (ref 1.4–7.7)
Neutrophils Relative %: 47.1 % (ref 43.0–77.0)
Platelets: 262 10*3/uL (ref 150.0–400.0)
RBC: 4.85 Mil/uL (ref 3.87–5.11)
RDW: 12.8 % (ref 11.5–15.5)
WBC: 4.8 10*3/uL (ref 4.0–10.5)

## 2019-11-07 LAB — BASIC METABOLIC PANEL
BUN: 21 mg/dL (ref 6–23)
CO2: 26 mEq/L (ref 19–32)
Calcium: 9.8 mg/dL (ref 8.4–10.5)
Chloride: 104 mEq/L (ref 96–112)
Creatinine, Ser: 0.83 mg/dL (ref 0.40–1.20)
GFR: 70.17 mL/min (ref >60.00)
Glucose, Bld: 97 mg/dL (ref 70–99)
Potassium: 3.8 mEq/L (ref 3.5–5.1)
Sodium: 142 mEq/L (ref 135–145)

## 2019-11-07 LAB — LIPID PANEL
Cholesterol: 208 mg/dL — ABNORMAL HIGH (ref 0–200)
HDL: 51.4 mg/dL (ref >39.00)
NonHDL: 156.53
Total CHOL/HDL Ratio: 4
Triglycerides: 226 mg/dL — ABNORMAL HIGH (ref 0.0–149.0)
VLDL: 45.2 mg/dL — ABNORMAL HIGH (ref 0.0–40.0)

## 2019-11-07 LAB — TSH: TSH: 2.4 u[IU]/mL (ref 0.35–4.50)

## 2019-11-07 LAB — LDL CHOLESTEROL, DIRECT: Direct LDL: 115 mg/dL

## 2019-11-07 LAB — HEMOGLOBIN A1C: Hgb A1c MFr Bld: 5.5 % (ref 4.6–6.5)

## 2019-11-08 ENCOUNTER — Encounter: Payer: Self-pay | Admitting: Internal Medicine

## 2019-11-11 ENCOUNTER — Ambulatory Visit (INDEPENDENT_AMBULATORY_CARE_PROVIDER_SITE_OTHER): Payer: BC Managed Care – PPO | Admitting: Internal Medicine

## 2019-11-11 ENCOUNTER — Other Ambulatory Visit: Payer: Self-pay

## 2019-11-11 ENCOUNTER — Encounter: Payer: Self-pay | Admitting: Internal Medicine

## 2019-11-11 DIAGNOSIS — E7800 Pure hypercholesterolemia, unspecified: Secondary | ICD-10-CM

## 2019-11-11 DIAGNOSIS — E039 Hypothyroidism, unspecified: Secondary | ICD-10-CM

## 2019-11-11 DIAGNOSIS — H9319 Tinnitus, unspecified ear: Secondary | ICD-10-CM

## 2019-11-11 DIAGNOSIS — M8580 Other specified disorders of bone density and structure, unspecified site: Secondary | ICD-10-CM

## 2019-11-11 DIAGNOSIS — C439 Malignant melanoma of skin, unspecified: Secondary | ICD-10-CM

## 2019-11-11 DIAGNOSIS — R739 Hyperglycemia, unspecified: Secondary | ICD-10-CM | POA: Diagnosis not present

## 2019-11-11 DIAGNOSIS — Z86010 Personal history of colonic polyps: Secondary | ICD-10-CM | POA: Diagnosis not present

## 2019-11-11 DIAGNOSIS — I10 Essential (primary) hypertension: Secondary | ICD-10-CM | POA: Diagnosis not present

## 2019-11-11 NOTE — Assessment & Plan Note (Signed)
Colonoscopy 06/2013.  Recommended f/u in 10 years.  He hemoccult cards negative 03/29/19.

## 2019-11-11 NOTE — Assessment & Plan Note (Signed)
Blood pressure under good control.  Continue same medication regimen.  Follow pressures.  Follow metabolic panel.   

## 2019-11-11 NOTE — Assessment & Plan Note (Signed)
Has had ENT evaluation.  Stable.

## 2019-11-11 NOTE — Assessment & Plan Note (Signed)
Discussed bone density.  Comparison to 2018 - improved.  Tscore -1.9.  Continue calcium, vitamin D, weight bearing exercise and fosamax.

## 2019-11-11 NOTE — Assessment & Plan Note (Signed)
Has adjusted her diet.  a1c improved significantly.  Continue low carb diet and exercise.  Follow met b and a1c.  

## 2019-11-11 NOTE — Assessment & Plan Note (Signed)
Triglycerides improved significantly.  Has adjusted her diet.  Exercises regularly.  Continue low carb diet and exercise.  Follow lipid panel.

## 2019-11-11 NOTE — Assessment & Plan Note (Signed)
Followed by dermatology

## 2019-11-11 NOTE — Assessment & Plan Note (Signed)
On thyroid replacement.  Follow tsh.  

## 2019-11-11 NOTE — Progress Notes (Signed)
Patient ID: Kathryn Francis, female   DOB: 12-31-1960, 59 y.o.   MRN: [id number]   Virtual Visit via video Note  This visit type was conducted due to national recommendations for restrictions regarding the COVID-19 pandemic (e.g. social distancing).  This format is felt to be most appropriate for this patient at this time.  All issues noted in this document were discussed and addressed.  No physical exam was performed (except for noted visual exam findings with Video Visits).   I connected with Kathryn Francis by a video enabled telemedicine application and verified that I am speaking with the correct person using two identifiers. Location patient: home Location provider: work Persons participating in the virtual visit: patient, provider  The limitations, risks, security and privacy concerns of performing an evaluation and management service by video and the availability of in person appointments have been discussed.  The patient expressed understanding and agreed to proceed.   Reason for visit: scheduled follow up.    HPI: She reports she is doing well.  Stays very active.  Exercises 2 hours per day.  No chest pain.  No sob.  No abdominal pain or bowel change reported.  Had covid in 07/2020.  No residual problems.  Planning to get vaccine next week.  Has adjusted her diet.  Following [facility] Lipid diet.  Sugars and triglycerides improved.  Up to date with colonoscopy and mammogram.  Overall feels good. Gave plasma - this week.  Blood pressure 124/82    ROS: See pertinent positives and negatives per HPI.  Past Medical History:  Diagnosis Date  . Hypercholesterolemia   . Hypertension   . Hypothyroidism   . Melanoma ([place])   . Patella fracture    right    Past Surgical History:  Procedure Laterality Date  . [place], 2000  . ORIF ELBOW FRACTURE Left 06/16/2018   Procedure: OPEN REDUCTION INTERNAL FIXATION (ORIF) ELBOW/OLECRANON FRACTURE;  Surgeon: Hessie Knows, MD;  Location:  [facility] ORS;  Service: Orthopedics;  Laterality: Left;    Family History  Problem Relation Age of Onset  . Hypertension Other        siblings  . Hypercholesterolemia Other        siblings  . Heart disease Other        parent  . Hypertension Other        parent  . Lupus Other        sister  . Breast cancer Neg Hx     SOCIAL HX: reviewed.    Current Outpatient Medications:  .  alendronate (FOSAMAX) 70 MG tablet, Take 70 mg by mouth once a week., Disp: , Rfl: 11 .  atorvastatin (LIPITOR) 10 MG tablet, TAKE 1 TABLET BY MOUTH EVERY DAY, Disp: 90 tablet, Rfl: 1 .  hydrochlorothiazide (MICROZIDE) 12.5 MG capsule, Take 1 capsule (12.5 mg total) by mouth daily., Disp: 30 capsule, Rfl: 0 .  levothyroxine (SYNTHROID) 75 MCG tablet, Take 1 tablet (75 mcg total) by mouth daily., Disp: 30 tablet, Rfl: 0 .  metoprolol tartrate (LOPRESSOR) 25 MG tablet, TAKE 1/2 TABLET BY MOUTH EVERY DAY, Disp: 45 tablet, Rfl: 1 .  Multiple Vitamin (MULTIVITAMIN) tablet, Take 1 tablet by mouth daily., Disp: , Rfl:  .  triamcinolone cream (KENALOG) 0.1 %, Apply 1 application topically 2 (two) times daily., Disp: 30 g, Rfl: 0 .  zolpidem (AMBIEN) 5 MG tablet, TAKE 1 TABLET BY MOUTH EVERY DAY AT BEDTIME AS NEEDED, Disp: 30 tablet, Rfl: 0  EXAM:  VITALS per patient if applicable: 854/62 ([employer])  GENERAL: alert, oriented, appears well and in no acute distress  HEENT: atraumatic, conjunttiva clear, no obvious abnormalities on inspection of external nose and ears  NECK: normal movements of the head and neck  LUNGS: on inspection no signs of respiratory distress, breathing rate appears normal, no obvious gross SOB, gasping or wheezing  CV: no obvious cyanosis  PSYCH/NEURO: pleasant and cooperative, no obvious depression or anxiety, speech and thought processing grossly intact  ASSESSMENT AND PLAN:  Discussed the following assessment and plan:  Hypercholesterolemia Triglycerides improved significantly.   Has adjusted her diet.  Exercises regularly.  Continue low carb diet and exercise.  Follow lipid panel.    Hyperglycemia Has adjusted her diet.  a1c improved significantly.  Continue low carb diet and exercise.  Follow met b and a1c.   History of colonic polyps Colonoscopy 06/2013.  Recommended f/u in 10 years.  He hemoccult cards negative 03/29/19.    Hypertension Blood pressure under good control.  Continue same medication regimen.  Follow pressures.  Follow metabolic panel.    Hypothyroidism On thyroid replacement.  Follow tsh.   Melanoma Followed by dermatology.   Osteopenia Discussed bone density.  Comparison to 2018 - improved.  Tscore -1.9.  Continue calcium, vitamin D, weight bearing exercise and fosamax.    Tinnitus Has had ENT evaluation.  Stable.    Orders Placed This Encounter  Procedures  . Hemoglobin A1c    Standing Status:   Future    Standing Expiration Date:   11/10/2020  . Hepatic function panel    Standing Status:   Future    Standing Expiration Date:   11/10/2020  . Lipid panel    Standing Status:   Future    Standing Expiration Date:   11/10/2020  . Basic metabolic panel    Standing Status:   Future    Standing Expiration Date:   11/10/2020    I discussed the assessment and treatment plan with the patient. The patient was provided an opportunity to ask questions and all were answered. The patient agreed with the plan and demonstrated an understanding of the instructions.   The patient was advised to call back or seek an in-person evaluation if the symptoms worsen or if the condition fails to improve as anticipated.   Einar Pheasant, MD

## 2019-11-20 ENCOUNTER — Encounter: Payer: Self-pay | Admitting: Internal Medicine

## 2019-12-02 ENCOUNTER — Other Ambulatory Visit: Payer: Self-pay | Admitting: Internal Medicine

## 2019-12-03 NOTE — Telephone Encounter (Signed)
rx ok'd for ambien #30 with no refills.   

## 2019-12-19 ENCOUNTER — Encounter: Payer: Self-pay | Admitting: Internal Medicine

## 2019-12-21 ENCOUNTER — Telehealth: Payer: Self-pay | Admitting: Internal Medicine

## 2019-12-21 NOTE — Telephone Encounter (Signed)
Kathlee Nations with [facility] called to get last lab appt results. Fax [phone]

## 2019-12-22 NOTE — Telephone Encounter (Signed)
Faxed

## 2020-01-07 ENCOUNTER — Other Ambulatory Visit: Payer: Self-pay | Admitting: Internal Medicine

## 2020-01-26 ENCOUNTER — Encounter: Payer: Self-pay | Admitting: Internal Medicine

## 2020-03-01 ENCOUNTER — Other Ambulatory Visit: Payer: Self-pay | Admitting: Internal Medicine

## 2020-03-02 NOTE — Telephone Encounter (Signed)
Refill request for Kathryn Francis, last seen 11-11-19, last filled 12-03-19.  Please advise.

## 2020-03-03 NOTE — Telephone Encounter (Signed)
rx sent in for ambien #30 with no refills.

## 2020-03-21 ENCOUNTER — Other Ambulatory Visit: Payer: Self-pay | Admitting: Internal Medicine

## 2020-04-25 ENCOUNTER — Encounter: Payer: Self-pay | Admitting: Internal Medicine

## 2020-04-26 ENCOUNTER — Other Ambulatory Visit (INDEPENDENT_AMBULATORY_CARE_PROVIDER_SITE_OTHER): Payer: BC Managed Care – PPO

## 2020-04-26 ENCOUNTER — Other Ambulatory Visit: Payer: Self-pay

## 2020-04-26 ENCOUNTER — Telehealth (INDEPENDENT_AMBULATORY_CARE_PROVIDER_SITE_OTHER): Payer: BC Managed Care – PPO | Admitting: Internal Medicine

## 2020-04-26 DIAGNOSIS — R300 Dysuria: Secondary | ICD-10-CM

## 2020-04-26 DIAGNOSIS — N3001 Acute cystitis with hematuria: Secondary | ICD-10-CM | POA: Diagnosis not present

## 2020-04-26 LAB — URINALYSIS, ROUTINE W REFLEX MICROSCOPIC
Bilirubin Urine: NEGATIVE
Ketones, ur: NEGATIVE
Nitrite: POSITIVE — AB
Specific Gravity, Urine: 1.015 (ref 1.000–1.030)
Total Protein, Urine: NEGATIVE
Urine Glucose: NEGATIVE
Urobilinogen, UA: 0.2 (ref 0.0–1.0)
pH: 6.5 (ref 5.0–8.0)

## 2020-04-26 MED ORDER — NITROFURANTOIN MONOHYD MACRO 100 MG PO CAPS: 100.0000 mg | ORAL_CAPSULE | Freq: Two times a day (BID) | ORAL | 0 refills | Status: DC

## 2020-04-26 NOTE — Progress Notes (Deleted)
Patient ID: Kathryn Francis, female   DOB: 1960-11-24, 59 y.o.   MRN: [id number]   Subjective:    Patient ID: Kathryn Francis, female    DOB: 08-05-1961, 59 y.o.   MRN: [id number]  HPI  Patient here for ***  Past Medical History:  Diagnosis Date  . Hypercholesterolemia   . Hypertension   . Hypothyroidism   . Melanoma ([place])   . Patella fracture    right   Past Surgical History:  Procedure Laterality Date  . [place], 2000  . ORIF ELBOW FRACTURE Left 06/16/2018   Procedure: OPEN REDUCTION INTERNAL FIXATION (ORIF) ELBOW/OLECRANON FRACTURE;  Surgeon: Hessie Knows, MD;  Location: [facility] ORS;  Service: Orthopedics;  Laterality: Left;   Family History  Problem Relation Age of Onset  . Hypertension Other        siblings  . Hypercholesterolemia Other        siblings  . Heart disease Other        parent  . Hypertension Other        parent  . Lupus Other        sister  . Breast cancer Neg Hx    Social History   Socioeconomic History  . Marital status: Married    Spouse name: Not on file  . Number of children: 2  . Years of education: Not on file  . Highest education level: Not on file  Occupational History  . Not on file  Tobacco Use  . Smoking status: Never Smoker  . Smokeless tobacco: Never Used  Substance and Sexual Activity  . Alcohol use: No    Alcohol/week: 0.0 standard drinks  . Drug use: No  . Sexual activity: Not on file  Other Topics Concern  . Not on file  Social History Narrative  . Not on file   Social Determinants of Health   Financial Resource Strain:   . Difficulty of Paying Living Expenses:   Food Insecurity:   . Worried About Charity fundraiser in the Last Year:   . Arboriculturist in the Last Year:   Transportation Needs:   . Film/video editor (Medical):   Marland Kitchen Lack of Transportation (Non-Medical):   Physical Activity:   . Days of Exercise per Week:   . Minutes of Exercise per Session:   Stress:   . Feeling of Stress :    Social Connections:   . Frequency of Communication with Friends and Family:   . Frequency of Social Gatherings with Friends and Family:   . Attends Religious Services:   . Active Member of Clubs or Organizations:   . Attends Archivist Meetings:   Marland Kitchen Marital Status:      Review of Systems     Objective:    Physical Exam  Ht 5\' 4"  (1.626 m)   Wt 124 lb (56.2 kg)   BMI 21.28 kg/m  Wt Readings from Last 3 Encounters:  04/26/20 124 lb (56.2 kg)  11/11/19 124 lb (56.2 kg)  03/16/19 128 lb 3.2 oz (58.2 kg)     Lab Results  Component Value Date   WBC 4.8 11/07/2019   HGB 13.8 11/07/2019   HCT 40.9 11/07/2019   PLT 262.0 11/07/2019   GLUCOSE 97 11/07/2019   CHOL 208 (H) 11/07/2019   TRIG 226.0 (H) 11/07/2019   HDL 51.40 11/07/2019   LDLDIRECT 115.0 11/07/2019   LDLCALC 110 (H) 05/05/2014   ALT 25 11/07/2019   AST 24  11/07/2019   NA 142 11/07/2019   K 3.8 11/07/2019   CL 104 11/07/2019   CREATININE 0.83 11/07/2019   BUN 21 11/07/2019   CO2 26 11/07/2019   TSH 2.40 11/07/2019   HGBA1C 5.5 11/07/2019    MM 3D SCREEN BREAST BILATERAL  Result Date: 09/09/2019 CLINICAL DATA:  Screening. EXAM: DIGITAL SCREENING BILATERAL MAMMOGRAM WITH TOMO AND CAD COMPARISON:  Previous exam(s). ACR Breast Density Category b: There are scattered areas of fibroglandular density. FINDINGS: There are no findings suspicious for malignancy. Images were processed with CAD. IMPRESSION: No mammographic evidence of malignancy. A result letter of this screening mammogram will be mailed directly to the patient. RECOMMENDATION: Screening mammogram in one year. (Code:SM-B-01Y) BI-RADS CATEGORY  1: Negative. Electronically Signed   By: Lovey Newcomer M.D.   On: 09/09/2019 13:22       Assessment & Plan:   Problem List Items Addressed This Visit    None       Einar Pheasant, MD

## 2020-04-26 NOTE — Telephone Encounter (Signed)
Pt scheduled with [facility]. Coming in today for urine

## 2020-04-26 NOTE — Progress Notes (Signed)
Pt seeing margaret tomorrow for UTI. Coming in this PM to give a urine sample. Orders placed and appt scheduled.

## 2020-04-27 ENCOUNTER — Ambulatory Visit: Payer: BC Managed Care – PPO | Admitting: Family

## 2020-04-28 ENCOUNTER — Telehealth: Payer: Self-pay | Admitting: Internal Medicine

## 2020-04-28 DIAGNOSIS — R319 Hematuria, unspecified: Secondary | ICD-10-CM

## 2020-04-28 LAB — URINE CULTURE
MICRO NUMBER:: 10680996
SPECIMEN QUALITY:: ADEQUATE

## 2020-04-28 NOTE — Telephone Encounter (Signed)
Please schedule a non fasting lab appt (for her to come give a urine sample) in two weeks.  Thanks

## 2020-04-29 ENCOUNTER — Encounter: Payer: Self-pay | Admitting: Internal Medicine

## 2020-04-30 NOTE — Telephone Encounter (Signed)
Pt has an appt scheduled 7/23

## 2020-05-01 ENCOUNTER — Encounter: Payer: Self-pay | Admitting: Internal Medicine

## 2020-05-01 DIAGNOSIS — N390 Urinary tract infection, site not specified: Secondary | ICD-10-CM | POA: Insufficient documentation

## 2020-05-01 NOTE — Progress Notes (Signed)
Patient ID: Kathryn Francis, female   DOB: 08-04-1961, 59 y.o.   MRN: [id number]   Virtual Visit via video Note  This visit type was conducted due to national recommendations for restrictions regarding the COVID-19 pandemic (e.g. social distancing).  This format is felt to be most appropriate for this patient at this time.  All issues noted in this document were discussed and addressed.  No physical exam was performed (except for noted visual exam findings with Video Visits).   I connected with Kathryn Francis by a video enabled telemedicine application or telephone and verified that I am speaking with the correct person using two identifiers. Location patient: home Location provider: work  Persons participating in the virtual visit: patient, provider  The limitations, risks, security and privacy concerns of performing an evaluation and management service by video and the availability of in person appointments have been discussed.  It has also been discussed with the patient that there may be a patient responsible charge related to this service. The patient has expressed understanding and has agreed to proceed.   Reason for visit:  Work in appt.   HPI: Work in with concerns regarding UTI.  States noticed some itching two days ago.  Symptoms progressed.  Some increased urinary urgency.  Also pain with urination and burning.  Minimal abdominal discomfort yesterday.  None today.  No fever.  Eating.  No nausea or vomiting.  Took AZO - two yesterday and two today.     ROS: See pertinent positives and negatives per HPI.  Past Medical History:  Diagnosis Date  . Hypercholesterolemia   . Hypertension   . Hypothyroidism   . Melanoma ([place])   . Patella fracture    right    Past Surgical History:  Procedure Laterality Date  . [place], 2000  . ORIF ELBOW FRACTURE Left 06/16/2018   Procedure: OPEN REDUCTION INTERNAL FIXATION (ORIF) ELBOW/OLECRANON FRACTURE;  Surgeon: Hessie Knows, MD;   Location: [facility] ORS;  Service: Orthopedics;  Laterality: Left;    Family History  Problem Relation Age of Onset  . Hypertension Other        siblings  . Hypercholesterolemia Other        siblings  . Heart disease Other        parent  . Hypertension Other        parent  . Lupus Other        sister  . Breast cancer Neg Hx     SOCIAL HX: reviewed.    Current Outpatient Medications:  .  alendronate (FOSAMAX) 70 MG tablet, Take 70 mg by mouth once a week., Disp: , Rfl: 11 .  atorvastatin (LIPITOR) 10 MG tablet, TAKE 1 TABLET BY MOUTH EVERY DAY, Disp: 90 tablet, Rfl: 1 .  hydrochlorothiazide (MICROZIDE) 12.5 MG capsule, Take 1 capsule (12.5 mg total) by mouth daily., Disp: 30 capsule, Rfl: 0 .  levothyroxine (SYNTHROID) 75 MCG tablet, TAKE 1 TABLET BY MOUTH EVERY DAY, Disp: 90 tablet, Rfl: 3 .  metoprolol tartrate (LOPRESSOR) 25 MG tablet, TAKE 1/2 TABLET BY MOUTH EVERY DAY, Disp: 45 tablet, Rfl: 1 .  Multiple Vitamin (MULTIVITAMIN) tablet, Take 1 tablet by mouth daily., Disp: , Rfl:  .  nitrofurantoin, macrocrystal-monohydrate, (MACROBID) 100 MG capsule, Take 1 capsule (100 mg total) by mouth 2 (two) times daily., Disp: 10 capsule, Rfl: 0 .  triamcinolone cream (KENALOG) 0.1 %, Apply 1 application topically 2 (two) times daily., Disp: 30 g, Rfl: 0 .  zolpidem (AMBIEN)  5 MG tablet, TAKE 1 TABLET BY MOUTH EVERY DAY AT BEDTIME AS NEEDED, Disp: 30 tablet, Rfl: 0  EXAM:  GENERAL: alert, oriented, appears well and in no acute distress  HEENT: atraumatic, conjunttiva clear, no obvious abnormalities on inspection of external nose and ears  NECK: normal movements of the head and neck  LUNGS: on inspection no signs of respiratory distress, breathing rate appears normal, no obvious gross SOB, gasping or wheezing  CV: no obvious cyanosis  PSYCH/NEURO: pleasant and cooperative, no obvious depression or anxiety, speech and thought processing grossly intact  ASSESSMENT AND  PLAN:  Discussed the following assessment and plan:  UTI (urinary tract infection) Urinalysis result and symptoms appear to be c/w UTI.  Treat with macrobid.  Stay hydrated.  Follow.  Will notify of results once culture results are available.  Stay hydrated.  Follow.     Meds ordered this encounter  Medications  . nitrofurantoin, macrocrystal-monohydrate, (MACROBID) 100 MG capsule    Sig: Take 1 capsule (100 mg total) by mouth 2 (two) times daily.    Dispense:  10 capsule    Refill:  0     I discussed the assessment and treatment plan with the patient. The patient was provided an opportunity to ask questions and all were answered. The patient agreed with the plan and demonstrated an understanding of the instructions.   The patient was advised to call back or seek an in-person evaluation if the symptoms worsen or if the condition fails to improve as anticipated.   Einar Pheasant, MD

## 2020-05-01 NOTE — Assessment & Plan Note (Signed)
Urinalysis result and symptoms appear to be c/w UTI.  Treat with macrobid.  Stay hydrated.  Follow.  Will notify of results once culture results are available.  Stay hydrated.  Follow.

## 2020-05-11 ENCOUNTER — Ambulatory Visit (INDEPENDENT_AMBULATORY_CARE_PROVIDER_SITE_OTHER): Payer: BC Managed Care – PPO

## 2020-05-11 ENCOUNTER — Ambulatory Visit (INDEPENDENT_AMBULATORY_CARE_PROVIDER_SITE_OTHER): Payer: BC Managed Care – PPO | Admitting: Internal Medicine

## 2020-05-11 ENCOUNTER — Encounter: Payer: Self-pay | Admitting: Internal Medicine

## 2020-05-11 ENCOUNTER — Other Ambulatory Visit: Payer: Self-pay

## 2020-05-11 VITALS — BP 110/78 | HR 82 | Temp 97.6°F | Resp 16 | Ht 64.0 in | Wt 124.6 lb

## 2020-05-11 DIAGNOSIS — E7800 Pure hypercholesterolemia, unspecified: Secondary | ICD-10-CM | POA: Diagnosis not present

## 2020-05-11 DIAGNOSIS — E039 Hypothyroidism, unspecified: Secondary | ICD-10-CM

## 2020-05-11 DIAGNOSIS — R0602 Shortness of breath: Secondary | ICD-10-CM

## 2020-05-11 DIAGNOSIS — R739 Hyperglycemia, unspecified: Secondary | ICD-10-CM | POA: Diagnosis not present

## 2020-05-11 DIAGNOSIS — I10 Essential (primary) hypertension: Secondary | ICD-10-CM

## 2020-05-11 DIAGNOSIS — Z0000 Encounter for general adult medical examination without abnormal findings: Secondary | ICD-10-CM | POA: Diagnosis not present

## 2020-05-11 DIAGNOSIS — M8580 Other specified disorders of bone density and structure, unspecified site: Secondary | ICD-10-CM

## 2020-05-11 DIAGNOSIS — Z860100 Personal history of colon polyps, unspecified: Secondary | ICD-10-CM

## 2020-05-11 DIAGNOSIS — Z1211 Encounter for screening for malignant neoplasm of colon: Secondary | ICD-10-CM

## 2020-05-11 DIAGNOSIS — R319 Hematuria, unspecified: Secondary | ICD-10-CM

## 2020-05-11 LAB — URINALYSIS, ROUTINE W REFLEX MICROSCOPIC
Bilirubin Urine: NEGATIVE
Hgb urine dipstick: NEGATIVE
Ketones, ur: NEGATIVE
Leukocytes,Ua: NEGATIVE
Nitrite: NEGATIVE
Specific Gravity, Urine: 1.025 (ref 1.000–1.030)
Total Protein, Urine: NEGATIVE
Urine Glucose: NEGATIVE
Urobilinogen, UA: 0.2 (ref 0.0–1.0)
pH: 5.5 (ref 5.0–8.0)

## 2020-05-11 LAB — BASIC METABOLIC PANEL
BUN: 18 mg/dL (ref 6–23)
CO2: 30 mEq/L (ref 19–32)
Calcium: 9.6 mg/dL (ref 8.4–10.5)
Chloride: 102 mEq/L (ref 96–112)
Creatinine, Ser: 0.82 mg/dL (ref 0.40–1.20)
GFR: 71.03 mL/min (ref >60.00)
Glucose, Bld: 100 mg/dL — ABNORMAL HIGH (ref 70–99)
Potassium: 5 mEq/L (ref 3.5–5.1)
Sodium: 138 mEq/L (ref 135–145)

## 2020-05-11 LAB — HEMOGLOBIN A1C: Hgb A1c MFr Bld: 5.9 % (ref 4.6–6.5)

## 2020-05-11 LAB — HEPATIC FUNCTION PANEL
ALT: 25 U/L (ref 0–35)
AST: 22 U/L (ref 0–37)
Albumin: 4.6 g/dL (ref 3.5–5.2)
Alkaline Phosphatase: 54 U/L (ref 39–117)
Bilirubin, Direct: 0.1 mg/dL (ref 0.0–0.3)
Total Bilirubin: 0.6 mg/dL (ref 0.2–1.2)
Total Protein: 7.3 g/dL (ref 6.0–8.3)

## 2020-05-11 LAB — LIPID PANEL
Cholesterol: 206 mg/dL — ABNORMAL HIGH (ref 0–200)
HDL: 44.8 mg/dL (ref >39.00)
NonHDL: 160.97
Total CHOL/HDL Ratio: 5
Triglycerides: 357 mg/dL — ABNORMAL HIGH (ref 0.0–149.0)
VLDL: 71.4 mg/dL — ABNORMAL HIGH (ref 0.0–40.0)

## 2020-05-11 LAB — LDL CHOLESTEROL, DIRECT: Direct LDL: 89 mg/dL

## 2020-05-11 MED ORDER — TRIAMCINOLONE ACETONIDE 0.1 % EX CREA: 1.0000 "application " | TOPICAL_CREAM | Freq: Two times a day (BID) | CUTANEOUS | 0 refills | Status: DC

## 2020-05-11 NOTE — Progress Notes (Signed)
Patient ID: Kathryn Francis, female   DOB: May 18, 1961, 59 y.o.   MRN: [id number]   Subjective:    Patient ID: Kathryn Francis, female    DOB: 23-Jan-1961, 59 y.o.   MRN: [id number]  HPI This visit occurred during the SARS-CoV-2 public health emergency.  Safety protocols were in place, including screening questions prior to the visit, additional usage of staff PPE, and extensive cleaning of exam room while observing appropriate contact time as indicated for disinfecting solutions.  Patient here for her physical exam.  Doing well.  Feels good.  Recent metatarsal fracture.  Has been released to start walking.  No chest pain.  Has noticed a change her breathing since covid.  Just feels cannot get as good of a breath with increased exercise.  No cough or congestion.  No chest pain or tightness.  No abdominal pain or bowel change reported.     Past Medical History:  Diagnosis Date  . Hypercholesterolemia   . Hypertension   . Hypothyroidism   . Melanoma ([place])   . Patella fracture    right   Past Surgical History:  Procedure Laterality Date  . [place], 2000  . ORIF ELBOW FRACTURE Left 06/16/2018   Procedure: OPEN REDUCTION INTERNAL FIXATION (ORIF) ELBOW/OLECRANON FRACTURE;  Surgeon: Hessie Knows, MD;  Location: [facility] ORS;  Service: Orthopedics;  Laterality: Left;   Family History  Problem Relation Age of Onset  . Hypertension Other        siblings  . Hypercholesterolemia Other        siblings  . Heart disease Other        parent  . Hypertension Other        parent  . Lupus Other        sister  . Breast cancer Neg Hx    Social History   Socioeconomic History  . Marital status: Married    Spouse name: Not on file  . Number of children: 2  . Years of education: Not on file  . Highest education level: Not on file  Occupational History  . Not on file  Tobacco Use  . Smoking status: Never Smoker  . Smokeless tobacco: Never Used  Substance and Sexual Activity  .  Alcohol use: No    Alcohol/week: 0.0 standard drinks  . Drug use: No  . Sexual activity: Not on file  Other Topics Concern  . Not on file  Social History Narrative  . Not on file   Social Determinants of Health   Financial Resource Strain:   . Difficulty of Paying Living Expenses:   Food Insecurity:   . Worried About Charity fundraiser in the Last Year:   . Arboriculturist in the Last Year:   Transportation Needs:   . Film/video editor (Medical):   Marland Kitchen Lack of Transportation (Non-Medical):   Physical Activity:   . Days of Exercise per Week:   . Minutes of Exercise per Session:   Stress:   . Feeling of Stress :   Social Connections:   . Frequency of Communication with Friends and Family:   . Frequency of Social Gatherings with Friends and Family:   . Attends Religious Services:   . Active Member of Clubs or Organizations:   . Attends Archivist Meetings:   Marland Kitchen Marital Status:     Outpatient Encounter Medications as of 05/11/2020  Medication Sig  . alendronate (FOSAMAX) 70 MG tablet Take 70 mg by mouth  once a week.  Marland Kitchen atorvastatin (LIPITOR) 10 MG tablet TAKE 1 TABLET BY MOUTH EVERY DAY  . hydrochlorothiazide (MICROZIDE) 12.5 MG capsule Take 1 capsule (12.5 mg total) by mouth daily.  Marland Kitchen levothyroxine (SYNTHROID) 75 MCG tablet TAKE 1 TABLET BY MOUTH EVERY DAY  . metoprolol tartrate (LOPRESSOR) 25 MG tablet TAKE 1/2 TABLET BY MOUTH EVERY DAY  . Multiple Vitamin (MULTIVITAMIN) tablet Take 1 tablet by mouth daily.  . nitrofurantoin, macrocrystal-monohydrate, (MACROBID) 100 MG capsule Take 1 capsule (100 mg total) by mouth 2 (two) times daily.  Marland Kitchen triamcinolone cream (KENALOG) 0.1 % Apply 1 application topically 2 (two) times daily.  Marland Kitchen zolpidem (AMBIEN) 5 MG tablet TAKE 1 TABLET BY MOUTH EVERY DAY AT BEDTIME AS NEEDED  . [DISCONTINUED] triamcinolone cream (KENALOG) 0.1 % Apply 1 application topically 2 (two) times daily.   No facility-administered encounter medications  on file as of 05/11/2020.    Review of Systems  Constitutional: Negative for appetite change and unexpected weight change.  HENT: Negative for congestion and sinus pressure.   Eyes: Negative for pain and visual disturbance.  Respiratory: Negative for cough, chest tightness and shortness of breath.        Describes feeling of not being able to get as good of a breath with walking.  Still exercising regularly.  Noticed change after covid.  No sob or chest tightness.   Cardiovascular: Negative for chest pain, palpitations and leg swelling.  Gastrointestinal: Negative for abdominal pain, diarrhea, nausea and vomiting.  Genitourinary: Negative for difficulty urinating and dysuria.  Musculoskeletal: Negative for joint swelling and myalgias.       Healed metatarsal fracture.    Skin: Negative for color change and rash.  Neurological: Negative for dizziness, light-headedness and headaches.  Hematological: Negative for adenopathy. Does not bruise/bleed easily.  Psychiatric/Behavioral: Negative for agitation and dysphoric mood.       Objective:    Physical Exam Vitals reviewed.  Constitutional:      General: She is not in acute distress.    Appearance: Normal appearance. She is well-developed.  HENT:     Head: Normocephalic and atraumatic.     Right Ear: External ear normal.     Left Ear: External ear normal.  Eyes:     General: No scleral icterus.       Right eye: No discharge.        Left eye: No discharge.     Conjunctiva/sclera: Conjunctivae normal.  Neck:     Thyroid: No thyromegaly.  Cardiovascular:     Rate and Rhythm: Normal rate and regular rhythm.  Pulmonary:     Effort: No tachypnea, accessory muscle usage or respiratory distress.     Breath sounds: Normal breath sounds. No decreased breath sounds or wheezing.  Chest:     Breasts:        Right: No inverted nipple, mass, nipple discharge or tenderness (no axillary adenopathy).        Left: No inverted nipple, mass,  nipple discharge or tenderness (no axilarry adenopathy).  Abdominal:     General: Bowel sounds are normal.     Palpations: Abdomen is soft.     Tenderness: There is no abdominal tenderness.  Musculoskeletal:        General: No swelling or tenderness.     Cervical back: Neck supple. No tenderness.  Lymphadenopathy:     Cervical: No cervical adenopathy.  Skin:    Findings: No erythema or rash.  Neurological:     Mental Status:  She is alert and oriented to person, place, and time.  Psychiatric:        Mood and Affect: Mood normal.        Behavior: Behavior normal.     BP 110/78   Pulse 82   Temp 97.6 F (36.4 C)   Resp 16   Ht '5\' 4"'$  (1.626 m)   Wt 124 lb 9.6 oz (56.5 kg)   SpO2 99%   BMI 21.39 kg/m  Wt Readings from Last 3 Encounters:  05/11/20 124 lb 9.6 oz (56.5 kg)  04/26/20 124 lb (56.2 kg)  11/11/19 124 lb (56.2 kg)     Lab Results  Component Value Date   WBC 4.8 11/07/2019   HGB 13.8 11/07/2019   HCT 40.9 11/07/2019   PLT 262.0 11/07/2019   GLUCOSE 100 (H) 05/11/2020   CHOL 206 (H) 05/11/2020   TRIG 357.0 (H) 05/11/2020   HDL 44.80 05/11/2020   LDLDIRECT 89.0 05/11/2020   LDLCALC 110 (H) 05/05/2014   ALT 25 05/11/2020   AST 22 05/11/2020   NA 138 05/11/2020   K 5.0 05/11/2020   CL 102 05/11/2020   CREATININE 0.82 05/11/2020   BUN 18 05/11/2020   CO2 30 05/11/2020   TSH 2.40 11/07/2019   HGBA1C 5.9 05/11/2020    MM 3D SCREEN BREAST BILATERAL  Result Date: 09/09/2019 CLINICAL DATA:  Screening. EXAM: DIGITAL SCREENING BILATERAL MAMMOGRAM WITH TOMO AND CAD COMPARISON:  Previous exam(s). ACR Breast Density Category b: There are scattered areas of fibroglandular density. FINDINGS: There are no findings suspicious for malignancy. Images were processed with CAD. IMPRESSION: No mammographic evidence of malignancy. A result letter of this screening mammogram will be mailed directly to the patient. RECOMMENDATION: Screening mammogram in one year.  (Code:SM-B-01Y) BI-RADS CATEGORY  1: Negative. Electronically Signed   By: Lovey Newcomer M.D.   On: 09/09/2019 13:22       Assessment & Plan:   Problem List Items Addressed This Visit    Health care maintenance    Physical today 05/11/20.  PAP 03/16/19 - negative with negative HPV.  Colonoscopy 06/2013.  Recommended f/u in 10 years.  Mammogram 09/09/19 - Birads I. Ifob.       Hematuria    Recent UTI.  RBC's noted in urine.  Recheck today to confirm clear now that infection clear.        History of colonic polyps    Colonoscopy 06/2013.  Recommended f/u in 10 years.  Hemoccult cards 03/2019 negative.  Would like repeat this year.  Ifob.        Hypercholesterolemia    Continue diet and exercise.  She exercises regularly.  Follow lipid panel.        Hyperglycemia    Continue diet and exercise.  Follow met b and a1c.        Hypertension    Blood pressure under good control.  Continue on hctz and metoprolol.  Follow pressures.  Follow metabolic panel.       Hypothyroidism    On thyroid replacement.  Follow tsh.        Osteopenia    Bone density - 10/2019 - osteopenia.  Continue calcium, vitamin D and weight bearding exercise.  On fosamax.        SOB (shortness of breath)    Noticed a change (as outlined) since covid.  Check cxr.  Further w/up pending results.        Relevant Orders   DG Chest 2 View  Other Visit Diagnoses    Colon cancer screening    -  Primary   Relevant Orders   Fecal occult blood, imunochemical       Einar Pheasant, MD

## 2020-05-11 NOTE — Assessment & Plan Note (Addendum)
Physical today 05/11/20.  PAP 03/16/19 - negative with negative HPV.  Colonoscopy 06/2013.  Recommended f/u in 10 years.  Mammogram 09/09/19 - Birads I. Ifob.

## 2020-05-12 ENCOUNTER — Other Ambulatory Visit: Payer: Self-pay | Admitting: Internal Medicine

## 2020-05-12 DIAGNOSIS — E875 Hyperkalemia: Secondary | ICD-10-CM

## 2020-05-12 NOTE — Progress Notes (Signed)
Order placed for f/u potassium.  

## 2020-05-13 ENCOUNTER — Telehealth: Payer: Self-pay | Admitting: Internal Medicine

## 2020-05-13 ENCOUNTER — Encounter: Payer: Self-pay | Admitting: Internal Medicine

## 2020-05-13 DIAGNOSIS — R319 Hematuria, unspecified: Secondary | ICD-10-CM | POA: Insufficient documentation

## 2020-05-13 NOTE — Assessment & Plan Note (Signed)
Bone density - 10/2019 - osteopenia.  Continue calcium, vitamin D and weight bearding exercise.  On fosamax.

## 2020-05-13 NOTE — Assessment & Plan Note (Signed)
Noticed a change (as outlined) since covid.  Check cxr.  Further w/up pending results.

## 2020-05-13 NOTE — Telephone Encounter (Signed)
Needs IFOB.  Discussed with her at appt and she was agreeable.  Please send.  Order placed.

## 2020-05-13 NOTE — Assessment & Plan Note (Signed)
Continue diet and exercise.  Follow met b and a1c.   

## 2020-05-13 NOTE — Assessment & Plan Note (Signed)
On thyroid replacement.  Follow tsh.  

## 2020-05-13 NOTE — Assessment & Plan Note (Signed)
Recent UTI.  RBC's noted in urine.  Recheck today to confirm clear now that infection clear.

## 2020-05-13 NOTE — Assessment & Plan Note (Signed)
Blood pressure under good control.  Continue on hctz and metoprolol.  Follow pressures.  Follow metabolic panel.

## 2020-05-13 NOTE — Assessment & Plan Note (Signed)
Continue diet and exercise.  She exercises regularly.  Follow lipid panel.

## 2020-05-13 NOTE — Assessment & Plan Note (Signed)
Colonoscopy 06/2013.  Recommended f/u in 10 years.  Hemoccult cards 03/2019 negative.  Would like repeat this year.  Ifob.

## 2020-05-15 ENCOUNTER — Encounter: Payer: Self-pay | Admitting: Internal Medicine

## 2020-05-15 DIAGNOSIS — Z8616 Personal history of COVID-19: Secondary | ICD-10-CM

## 2020-05-15 DIAGNOSIS — R0602 Shortness of breath: Secondary | ICD-10-CM

## 2020-05-15 NOTE — Telephone Encounter (Signed)
Order placed for pulmonary referral.  

## 2020-05-15 NOTE — Telephone Encounter (Signed)
LMTCB

## 2020-05-16 NOTE — Telephone Encounter (Signed)
Pt aware and will pick up ifob at lab appt next week

## 2020-05-22 ENCOUNTER — Other Ambulatory Visit (INDEPENDENT_AMBULATORY_CARE_PROVIDER_SITE_OTHER): Payer: BC Managed Care – PPO

## 2020-05-22 ENCOUNTER — Other Ambulatory Visit: Payer: Self-pay

## 2020-05-22 DIAGNOSIS — E875 Hyperkalemia: Secondary | ICD-10-CM | POA: Diagnosis not present

## 2020-05-22 LAB — POTASSIUM: Potassium: 3.9 mEq/L (ref 3.5–5.1)

## 2020-05-22 NOTE — Addendum Note (Signed)
Addended by: Leeanne Rio on: 05/22/2020 03:08 PM   Modules accepted: Orders

## 2020-07-05 ENCOUNTER — Other Ambulatory Visit: Payer: Self-pay

## 2020-07-05 ENCOUNTER — Encounter: Payer: Self-pay | Admitting: Pulmonary Disease

## 2020-07-05 ENCOUNTER — Telehealth: Payer: Self-pay | Admitting: Pulmonary Disease

## 2020-07-05 ENCOUNTER — Other Ambulatory Visit: Payer: Self-pay | Admitting: Internal Medicine

## 2020-07-05 ENCOUNTER — Ambulatory Visit: Payer: BC Managed Care – PPO | Admitting: Pulmonary Disease

## 2020-07-05 VITALS — BP 122/74 | HR 86 | Temp 97.3°F | Ht 64.0 in | Wt 127.2 lb

## 2020-07-05 DIAGNOSIS — Z8616 Personal history of COVID-19: Secondary | ICD-10-CM | POA: Diagnosis not present

## 2020-07-05 DIAGNOSIS — R0602 Shortness of breath: Secondary | ICD-10-CM

## 2020-07-05 NOTE — Progress Notes (Signed)
Subjective:    Patient ID: Kathryn Francis, female    DOB: 04/19/1961, 59 y.o.   MRN: [id number]  HPI This is a 59 year old lifelong never smoker who presents for evaluation of dyspnea after COVID-19.  She is kindly referred by Dr. Einar Pheasant.  The patient states that she notices that she has difficulties getting a deep breath and cannot take a deep breath when exercising walking or riding a bike uphill.  She has actually been fairly active and conditioned up until the time that she had COVID-19 in October 2020.  Since then she has had the difficulties particularly during exercise.  She does not feel dyspneic at any other time.  She has not had any other pulmonary diagnoses previously.  No history of asthma no other issues.  As noted she is a lifelong never smoker.  She has not had any occupational history.  No recent fevers, chills or sweats.  No respiratory illnesses since event 59 in October 2020.  She has since received the [employer] vaccine for SARS-CoV-2.  No history of environmental allergies.  History of asthma.  No chest pain.  Other symptomatology.   Review of Systems A 10 point review of systems was performed and it is as noted above otherwise negative.  Past Medical History:  Diagnosis Date  . Hypercholesterolemia   . Hypertension   . Hypothyroidism   . Melanoma ([place])   . Patella fracture    right   Past Surgical History:  Procedure Laterality Date  . [place], 2000  . ORIF ELBOW FRACTURE Left 06/16/2018   Procedure: OPEN REDUCTION INTERNAL FIXATION (ORIF) ELBOW/OLECRANON FRACTURE;  Surgeon: Hessie Knows, MD;  Location: [facility] ORS;  Service: Orthopedics;  Laterality: Left;   Family History  Problem Relation Age of Onset  . Hypertension Other        siblings  . Hypercholesterolemia Other        siblings  . Heart disease Other        parent  . Hypertension Other        parent  . Lupus Other        sister  . Breast cancer Neg Hx    Social History    Tobacco Use  . Smoking status: Never Smoker  . Smokeless tobacco: Never Used  Substance Use Topics  . Alcohol use: No    Alcohol/week: 0.0 standard drinks   Allergies  Allergen Reactions  . Sulfa Antibiotics Other (See Comments)    nausea   Current Meds  Medication Sig  . alendronate (FOSAMAX) 70 MG tablet Take 70 mg by mouth once a week.  . hydrochlorothiazide (MICROZIDE) 12.5 MG capsule Take 12.5 mg by mouth daily.  Marland Kitchen levothyroxine (SYNTHROID) 75 MCG tablet TAKE 1 TABLET BY MOUTH EVERY DAY  . metoprolol tartrate (LOPRESSOR) 25 MG tablet TAKE 1/2 TABLET BY MOUTH EVERY DAY  . Multiple Vitamin (MULTIVITAMIN) tablet Take 1 tablet by mouth daily.  Marland Kitchen triamcinolone cream (KENALOG) 0.1 % APPLY TO AFFECTED AREA TWICE A DAY  . zolpidem (AMBIEN) 5 MG tablet TAKE 1 TABLET BY MOUTH EVERY DAY AT BEDTIME AS NEEDED  . [DISCONTINUED] atorvastatin (LIPITOR) 10 MG tablet TAKE 1 TABLET BY MOUTH EVERY DAY   Immunization History  Administered Date(s) Administered  . Influenza,inj,Quad PF,6+ Mos 07/21/2019  . Influenza-Unspecified 08/03/2018  . PFIZER SARS-COV-2 Vaccination 11/13/2019, 12/06/2019  . Td 03/26/2011       Objective:   Physical Exam BP 122/74 (BP Location: Left Arm, Cuff Size:  Normal)   Pulse 86   Temp (!) 97.3 F (36.3 C) (Temporal)   Ht 5\' 4"  (1.626 m)   Wt 127 lb 3.2 oz (57.7 kg)   SpO2 96%   BMI 21.83 kg/m  GENERAL: Well-developed, well-nourished no acute distress.  Fully ambulatory. HEAD: Normocephalic, atraumatic.  EYES: Pupils equal, round, reactive to light.  No scleral icterus.  MOUTH: Nose/mouth/throat not examined due to masking requirements for COVID 19. NECK: Supple. No thyromegaly. Trachea midline. No JVD.  No adenopathy. PULMONARY: Good air entry bilaterally.  No adventitious sounds. CARDIOVASCULAR: S1 and S2. Regular rate and rhythm.  No rubs, murmurs or gallops heard. ABDOMEN: Benign. MUSCULOSKELETAL: No joint deformity, no clubbing, no edema.   NEUROLOGIC: No overt focal deficits.  No gait disturbance.  Speech is fluent. SKIN: Intact,warm,dry.  Limited exam shows no rashes. PSYCH: Mood and behavior normal.  Chest x-ray performed on 14 May 2020 shows basilar atelectasis and scarring.     Assessment & Plan:     ICD-10-CM   1. SOB (shortness of breath)  R06.02 Pulmonary Function Test [facility] Only    CT ANGIO CHEST PE W OR WO CONTRAST    D-Dimer, Quantitative   Post COVID-19 May indicate ILD post Covid Less likely PE (would be chronic) Obtain chest CT with contrast Obtain PFTs  2. Personal history of covid-19  Z86.16 CT ANGIO CHEST PE W OR WO CONTRAST   This issue adds complexity to her management Dyspnea has been a residual symptom post COVID-19   Orders Placed This Encounter  Procedures  . CT ANGIO CHEST PE W OR WO CONTRAST    Standing Status:   Future    Standing Expiration Date:   07/05/2021    Order Specific Question:   If indicated for the ordered procedure, I authorize the administration of contrast media per Radiology protocol    Answer:   Yes    Order Specific Question:   Is patient pregnant?    Answer:   No    Order Specific Question:   Preferred imaging location?    Answer:   [facility]    Order Specific Question:   Radiology Contrast Protocol - do NOT remove file path    Answer:   [email or web address]  . D-Dimer, Quantitative    Standing Status:   Future    Standing Expiration Date:   07/05/2021  . Pulmonary Function Test [facility] Only    Standing Status:   Future    Standing Expiration Date:   07/05/2021    Scheduling Instructions:     ASAP   Discussion:  Patient has residual dyspnea post COVID-19.  This is a common finding post COVID-19.  Occasionally residual pulmonary fibrosis is noted, other times chronic PE, although at times nothing is found.  The symptom appears to eventually resolve over time if no abnormalities are found.  Obtain CT with contrast (checking  D-dimer first) and pulmonary function testing.  She is to follow-up in just prior to that time should any new difficulties arise.  Thank you for allowing me to participate in this patient's care.  Renold Don, MD  PCCM   *This note was dictated using voice recognition software/Dragon.  Despite best efforts to proofread, errors can occur which can change the meaning.  Any change was purely unintentional.

## 2020-07-05 NOTE — Telephone Encounter (Signed)
Per Dr. Patsey Berthold verbally- order D dimer.  D dimer has been ordered. Patient stated that she would come by tomorrow or Monday to have lab drawn.  Nothing further is needed at this time.

## 2020-07-05 NOTE — Patient Instructions (Signed)
We are obtaining chest CT to evaluate your shortness of breath we are also scheduling breathing tests.  We will see you in follow-up in 2 to 3 weeks time call sooner should any new problems arise.

## 2020-07-05 NOTE — Telephone Encounter (Signed)
Patient's  Insurance will not approve CT and  requires peer to peer to be done and or D dimer. We are waiting for D dimer results

## 2020-07-09 ENCOUNTER — Other Ambulatory Visit
Admission: RE | Admit: 2020-07-09 | Discharge: 2020-07-09 | Disposition: A | Discharge status: Home / Self Care | Payer: BC Managed Care – PPO | Attending: Pulmonary Disease | Admitting: Pulmonary Disease

## 2020-07-09 DIAGNOSIS — Z8616 Personal history of COVID-19: Secondary | ICD-10-CM | POA: Insufficient documentation

## 2020-07-09 DIAGNOSIS — R0602 Shortness of breath: Secondary | ICD-10-CM | POA: Diagnosis not present

## 2020-07-09 LAB — FIBRIN DERIVATIVES D-DIMER (ARMC ONLY): Fibrin derivatives D-dimer (ARMC): 242.77 ng/mL (FEU) (ref 0.00–499.00)

## 2020-07-10 ENCOUNTER — Telehealth: Payer: Self-pay | Admitting: Pulmonary Disease

## 2020-07-10 DIAGNOSIS — Z8616 Personal history of COVID-19: Secondary | ICD-10-CM

## 2020-07-10 NOTE — Telephone Encounter (Signed)
Kathryn Pita, MD  Claudette Head A, CMA Her D-dimer was negative you may gone ahead and just order CT with contrast. Rule out fibrosis. Chest x-ray on 05/11/2020 did show evidence of atelectasis/fibrosis. She is post Covid.   Lm for a patient.

## 2020-07-11 ENCOUNTER — Telehealth: Payer: Self-pay | Admitting: Pulmonary Disease

## 2020-07-11 ENCOUNTER — Encounter: Payer: Self-pay | Admitting: Pulmonary Disease

## 2020-07-11 ENCOUNTER — Other Ambulatory Visit: Payer: Self-pay | Admitting: Internal Medicine

## 2020-07-11 NOTE — Telephone Encounter (Signed)
Please see 07/10/2020 phone note.

## 2020-07-11 NOTE — Telephone Encounter (Signed)
Lm x2 for patient.   

## 2020-07-11 NOTE — Addendum Note (Signed)
Addended by: Claudette Head A on: 07/11/2020 03:41 PM   Modules accepted: Orders

## 2020-07-11 NOTE — Telephone Encounter (Signed)
Patient is aware of results and voiced her understanding. CT chest has been ordered. Nothing further needed.

## 2020-07-12 ENCOUNTER — Other Ambulatory Visit: Payer: Self-pay

## 2020-07-12 DIAGNOSIS — R0602 Shortness of breath: Secondary | ICD-10-CM

## 2020-07-17 ENCOUNTER — Other Ambulatory Visit: Payer: Self-pay

## 2020-07-17 ENCOUNTER — Ambulatory Visit
Admission: RE | Admit: 2020-07-17 | Discharge: 2020-07-17 | Disposition: A | Discharge status: Home / Self Care | Payer: BC Managed Care – PPO | Source: Ambulatory Visit | Attending: Pulmonary Disease | Admitting: Pulmonary Disease

## 2020-07-17 ENCOUNTER — Other Ambulatory Visit
Admission: RE | Admit: 2020-07-17 | Discharge: 2020-07-17 | Disposition: A | Discharge status: Home / Self Care | Payer: BC Managed Care – PPO | Source: Home / Self Care | Attending: Pulmonary Disease | Admitting: Pulmonary Disease

## 2020-07-17 DIAGNOSIS — Z8616 Personal history of COVID-19: Secondary | ICD-10-CM | POA: Insufficient documentation

## 2020-07-17 DIAGNOSIS — R0602 Shortness of breath: Secondary | ICD-10-CM | POA: Diagnosis present

## 2020-07-17 LAB — BASIC METABOLIC PANEL
Anion gap: 9 (ref 5–15)
BUN: 16 mg/dL (ref 6–20)
CO2: 25 mmol/L (ref 22–32)
Calcium: 9.5 mg/dL (ref 8.9–10.3)
Chloride: 101 mmol/L (ref 98–111)
Creatinine, Ser: 0.69 mg/dL (ref 0.44–1.00)
GFR calc Af Amer: >60 mL/min (ref >60)
GFR calc non Af Amer: >60 mL/min (ref >60)
Glucose, Bld: 96 mg/dL (ref 70–99)
Potassium: 4.5 mmol/L (ref 3.5–5.1)
Sodium: 135 mmol/L (ref 135–145)

## 2020-07-17 LAB — FIBRIN DERIVATIVES D-DIMER (ARMC ONLY): Fibrin derivatives D-dimer (ARMC): 241.11 ng/mL (FEU) (ref 0.00–499.00)

## 2020-07-17 LAB — POCT I-STAT CREATININE: Creatinine, Ser: 0.9 mg/dL (ref 0.44–1.00)

## 2020-07-17 MED ORDER — IOHEXOL 300 MG/ML  SOLN
75.0000 mL | Freq: Once | INTRAMUSCULAR | Status: AC | PRN
  Administered 2020-07-17: 75 mL via INTRAVENOUS

## 2020-07-17 NOTE — Addendum Note (Signed)
Addended by: Santiago Bur on: 07/17/2020 10:03 AM   Modules accepted: Orders

## 2020-07-20 ENCOUNTER — Telehealth: Payer: Self-pay

## 2020-07-20 NOTE — Telephone Encounter (Signed)
Patient is aware of date/time of covid test prior to PFT.  

## 2020-07-24 ENCOUNTER — Other Ambulatory Visit: Payer: Self-pay

## 2020-07-24 ENCOUNTER — Other Ambulatory Visit
Admission: RE | Admit: 2020-07-24 | Discharge: 2020-07-24 | Disposition: A | Discharge status: Home / Self Care | Payer: BC Managed Care – PPO | Source: Ambulatory Visit | Attending: Pulmonary Disease | Admitting: Pulmonary Disease

## 2020-07-24 DIAGNOSIS — Z20822 Contact with and (suspected) exposure to covid-19: Secondary | ICD-10-CM | POA: Diagnosis not present

## 2020-07-24 DIAGNOSIS — Z01812 Encounter for preprocedural laboratory examination: Secondary | ICD-10-CM | POA: Diagnosis not present

## 2020-07-24 LAB — SARS CORONAVIRUS 2 (TAT 6-24 HRS): SARS Coronavirus 2: NEGATIVE

## 2020-07-25 ENCOUNTER — Ambulatory Visit: Payer: BC Managed Care – PPO | Attending: Pulmonary Disease

## 2020-07-25 DIAGNOSIS — R0602 Shortness of breath: Secondary | ICD-10-CM | POA: Insufficient documentation

## 2020-07-25 DIAGNOSIS — Z8616 Personal history of covid-19: Secondary | ICD-10-CM | POA: Insufficient documentation

## 2020-07-25 MED ORDER — ALBUTEROL SULFATE (2.5 MG/3ML) 0.083% IN NEBU
2.5000 mg | INHALATION_SOLUTION | Freq: Once | RESPIRATORY_TRACT | Status: AC
  Administered 2020-07-25: 2.5 mg via RESPIRATORY_TRACT

## 2020-07-26 LAB — PULMONARY FUNCTION TEST ARMC ONLY
DL/VA % pred: 79 %
DL/VA: 3.37 ml/min/mmHg/L
DLCO unc % pred: 88 %
DLCO unc: 17.85 ml/min/mmHg
FEF 25-75 Post: 1.81 L/sec
FEF 25-75 Pre: 2.11 L/sec
FEF2575-%Change-Post: -13 %
FEF2575-%Pred-Post: 77 %
FEF2575-%Pred-Pre: 89 %
FEV1-%Change-Post: -4 %
FEV1-%Pred-Post: 90 %
FEV1-%Pred-Pre: 94 %
FEV1-Post: 2.32 L
FEV1-Pre: 2.42 L
FEV1FVC-%Change-Post: 2 %
FEV1FVC-%Pred-Pre: 99 %
FEV6-%Change-Post: -6 %
FEV6-%Pred-Post: 91 %
FEV6-%Pred-Pre: 97 %
FEV6-Post: 2.91 L
FEV6-Pre: 3.11 L
FEV6FVC-%Change-Post: 0 %
FEV6FVC-%Pred-Post: 103 %
FEV6FVC-%Pred-Pre: 103 %
FVC-%Change-Post: -6 %
FVC-%Pred-Post: 88 %
FVC-%Pred-Pre: 94 %
FVC-Post: 2.92 L
Post FEV1/FVC ratio: 79 %
Post FEV6/FVC ratio: 100 %
Pre FEV1/FVC ratio: 77 %
Pre FEV6/FVC Ratio: 100 %
RV % pred: 95 %
RV: 1.9 L
TLC % pred: 97 %
TLC: 4.91 L

## 2020-08-07 ENCOUNTER — Ambulatory Visit: Payer: BC Managed Care – PPO | Admitting: Pulmonary Disease

## 2020-08-09 ENCOUNTER — Other Ambulatory Visit: Payer: Self-pay | Admitting: Internal Medicine

## 2020-08-14 ENCOUNTER — Telehealth: Payer: Self-pay | Admitting: Pulmonary Disease

## 2020-08-14 NOTE — Telephone Encounter (Signed)
Spoke to patient, who is requesting letter explaining why CT chest was ordered on 07/18/20. This is needed in order for insurance to cover CT.  Letter can been faxed to [facility] [phone].  Dr. Patsey Berthold, please advise. Thanks

## 2020-08-15 NOTE — Telephone Encounter (Signed)
Last office note has been faxed to provided fax number. Patient is aware and voiced her understanding.  Nothing further needed.

## 2020-08-15 NOTE — Telephone Encounter (Signed)
Can also send copy of my note in which I indicate that ILD post Covid needed to be ruled out.

## 2020-08-20 ENCOUNTER — Ambulatory Visit: Payer: BC Managed Care – PPO | Admitting: Pulmonary Disease

## 2020-08-20 ENCOUNTER — Encounter: Payer: Self-pay | Admitting: Pulmonary Disease

## 2020-08-20 ENCOUNTER — Other Ambulatory Visit: Payer: Self-pay

## 2020-08-20 VITALS — BP 124/80 | HR 85 | Temp 97.3°F | Ht 64.0 in | Wt 128.4 lb

## 2020-08-20 DIAGNOSIS — R0602 Shortness of breath: Secondary | ICD-10-CM | POA: Diagnosis not present

## 2020-08-20 DIAGNOSIS — Z8616 Personal history of COVID-19: Secondary | ICD-10-CM | POA: Diagnosis not present

## 2020-08-20 NOTE — Progress Notes (Signed)
Subjective:    Patient ID: Kathryn Francis, female    DOB: 09-10-1961, 59 y.o.   MRN: [id number]  HPI Patient is a 59 year old lifelong never smoker who presents for follow-up on the issue of dyspnea after COVID-19 infection.  Patient had COVID-19 in October 2020.  She was initially seen here on 05 July 2020.  She had a CT scan of the chest that did not show any evidence of post COVID-19 fibrosis.  Pulmonary function testing was performed on 25 July 2020 and was essentially normal.  FEV1 was 2.42 L or 94% predicted FVC 3.12 L or 94% predicted FEV1/FVC 77%, lung volumes and DLCO normal.  She continues to have dyspnea and now she has more of a pronounced sensation when going up stairs which did not used to be an issue previously.  She has not had any chest pain, no orthopnea or paroxysmal nocturnal dyspnea.  No lower extremity edema.  No fevers, chills or sweats.   Review of Systems A 10 point review of systems was performed and it is as noted above otherwise negative.  Allergies  Allergen Reactions  . Sulfa Antibiotics Other (See Comments)    nausea   Current Meds  Medication Sig  . alendronate (FOSAMAX) 70 MG tablet Take 70 mg by mouth once a week.  Marland Kitchen atorvastatin (LIPITOR) 10 MG tablet TAKE 1 TABLET BY MOUTH EVERY DAY  . hydrochlorothiazide (MICROZIDE) 12.5 MG capsule Take 12.5 mg by mouth daily.  Marland Kitchen levothyroxine (SYNTHROID) 75 MCG tablet TAKE 1 TABLET BY MOUTH EVERY DAY  . metoprolol tartrate (LOPRESSOR) 25 MG tablet TAKE 1/2 TABLET BY MOUTH EVERY DAY  . Multiple Vitamin (MULTIVITAMIN) tablet Take 1 tablet by mouth daily.  Marland Kitchen triamcinolone cream (KENALOG) 0.1 % APPLY TO AFFECTED AREA TWICE A DAY  . zolpidem (AMBIEN) 5 MG tablet TAKE 1 TABLET BY MOUTH EVERY DAY AT BEDTIME AS NEEDED  . [DISCONTINUED] nitrofurantoin, macrocrystal-monohydrate, (MACROBID) 100 MG capsule Take 1 capsule (100 mg total) by mouth 2 (two) times daily.   Immunization History  Administered Date(s)  Administered  . Influenza,inj,Quad PF,6+ Mos 07/21/2019  . Influenza-Unspecified 08/03/2018  . PFIZER SARS-COV-2 Vaccination 11/13/2019, 12/06/2019  . Td 03/26/2011       Objective:   Physical Exam BP 124/80 (BP Location: Left Arm, Cuff Size: Normal)   Pulse 85   Temp (!) 97.3 F (36.3 C) (Temporal)   Ht 5\' 4"  (1.626 m)   Wt 128 lb 6.4 oz (58.2 kg)   SpO2 99%   BMI 22.04 kg/m   GENERAL: Well-developed, well-nourished no acute distress.  Fully ambulatory. HEAD: Normocephalic, atraumatic.  EYES: Pupils equal, round, reactive to light.  No scleral icterus.  MOUTH: Nose/mouth/throat not examined due to masking requirements for COVID 19. NECK: Supple. No thyromegaly. Trachea midline. No JVD.  No adenopathy. PULMONARY: Good air entry bilaterally.  No adventitious sounds. CARDIOVASCULAR: S1 and S2. Regular rate and rhythm.  No rubs, murmurs or gallops heard. ABDOMEN: Benign. MUSCULOSKELETAL: No joint deformity, no clubbing, no edema.  NEUROLOGIC: No overt focal deficits.  No gait disturbance.  Speech is fluent. SKIN: Intact,warm,dry.  Limited exam shows no rashes. PSYCH: Mood and behavior normal.  Representative slice from CT chest obtained 17 July 2020, independently reviewed:      Assessment & Plan:     ICD-10-CM   1. SOB (shortness of breath)  R06.02 Cardiopulmonary exercise test    ECHOCARDIOGRAM COMPLETE   Issue continues to worsen Negative pulmonary work-up so far Obtain echocardiogram  and cardiopulmonary stress test  2. Personal history of COVID-19  Z86.16    Issue adds complexity to her management Dyspnea may be related to dysfunction of pulmonary stretch receptors   Orders Placed This Encounter  Procedures  . Cardiopulmonary exercise test    Standing Status:   Future    Standing Expiration Date:   08/20/2021    Order Specific Question:   Where should this test be performed?    Answer:   [facility]  . ECHOCARDIOGRAM COMPLETE    Standing Status:   Future     Standing Expiration Date:   02/17/2021    Scheduling Instructions:     Next available.    Order Specific Question:   Where should this test be performed    Answer:   [facility]    Order Specific Question:   Please indicate who you request to read the echo results.    Answer:   [facility]    Order Specific Question:   Perflutren DEFINITY (image enhancing agent) should be administered unless hypersensitivity or allergy exist    Answer:   Administer Perflutren    Order Specific Question:   Reason for exam-Echo    Answer:   Dyspnea  786.09 / R06.00   Discussion:  Patient continues to have difficulties with dyspnea now worsening to the point where going up stairs is difficult.  She has had negative pulmonary work-up so far.  Will obtain a cardiopulmonary stress test and 2D echo to evaluate for potential cardiac etiology of dyspnea.  If these are normal query whether there is dysfunction of the pulmonary stretch receptors (mechanoreceptors) was COVID-19.  This may imply issues with the respiratory tract innervation, these issues are still being investigated in this emerging pandemic.  We will see the patient in follow-up after the above testing is done.  He is to contact us prior to that time should any new difficulties arise.   Renold Don, MD [facility]   *This note was dictated using voice recognition software/Dragon.  Despite best efforts to proofread, errors can occur which can change the meaning.  Any change was purely unintentional.

## 2020-08-20 NOTE — Patient Instructions (Signed)
We are going to get an echocardiogram  We are also going to get a cardiopulmonary stress test

## 2020-08-23 ENCOUNTER — Other Ambulatory Visit: Payer: Self-pay

## 2020-08-23 ENCOUNTER — Ambulatory Visit
Admission: RE | Admit: 2020-08-23 | Discharge: 2020-08-23 | Disposition: A | Discharge status: Home / Self Care | Payer: BC Managed Care – PPO | Source: Ambulatory Visit | Attending: Pulmonary Disease | Admitting: Pulmonary Disease

## 2020-08-23 ENCOUNTER — Telehealth: Payer: Self-pay | Admitting: Pulmonary Disease

## 2020-08-23 DIAGNOSIS — I340 Nonrheumatic mitral (valve) insufficiency: Secondary | ICD-10-CM | POA: Diagnosis not present

## 2020-08-23 DIAGNOSIS — R0602 Shortness of breath: Secondary | ICD-10-CM | POA: Diagnosis not present

## 2020-08-23 NOTE — Telephone Encounter (Signed)
Pr dropped of form to be signed by Dr. Patsey Berthold for her insurance to cover CT. Form given to CG and will be processed.//TM

## 2020-08-23 NOTE — Progress Notes (Signed)
*  PRELIMINARY RESULTS* Echocardiogram 2D Echocardiogram has been performed.  Kathryn Francis 08/23/2020, 11:23 AM

## 2020-08-24 LAB — ECHOCARDIOGRAM COMPLETE
AR max vel: 2.75 cm2
AV Area VTI: 2.69 cm2
AV Area mean vel: 2.45 cm2
AV Mean grad: 4 mmHg
AV Peak grad: 5.9 mmHg
Ao pk vel: 1.21 m/s
Area-P 1/2: 4.8 cm2
S' Lateral: 2.81 cm

## 2020-08-29 ENCOUNTER — Telehealth: Payer: Self-pay | Admitting: Pulmonary Disease

## 2020-08-29 NOTE — Telephone Encounter (Signed)
Please see 08/23/2020 phone note.   Spoke to patient and verified that she does not need original copy of appeal. Nothing further needed.

## 2020-08-29 NOTE — Telephone Encounter (Signed)
Paperwork has been completed by Dr. Patsey Berthold and faxed.  Called patient to see if she would like the original paperwork back. No answer so I left a message. Will hold onto paperwork for now.

## 2020-08-31 ENCOUNTER — Telehealth: Payer: Self-pay | Admitting: Pulmonary Disease

## 2020-08-31 NOTE — Telephone Encounter (Signed)
Should have been routed to [facility] - hit wrong button

## 2020-08-31 NOTE — Telephone Encounter (Signed)
Lm for Megan with [employer].

## 2020-08-31 NOTE — Telephone Encounter (Signed)
Spoke Kathryn Francis with [employer], who stated that she received appeal letter from [place]. She is unsure as to why appeal was started, as CT was approved. Patient is aware of this information and voiced her understanding.  Patient will contact Kathryn Francis to discuss this further.  Nothing further needed.

## 2020-09-05 ENCOUNTER — Ambulatory Visit: Payer: Self-pay

## 2020-09-05 DIAGNOSIS — Z23 Encounter for immunization: Secondary | ICD-10-CM

## 2020-09-12 ENCOUNTER — Other Ambulatory Visit: Payer: Self-pay | Admitting: Internal Medicine

## 2020-09-25 ENCOUNTER — Ambulatory Visit (HOSPITAL_COMMUNITY): Payer: BC Managed Care – PPO | Attending: Pulmonary Disease

## 2020-09-25 ENCOUNTER — Other Ambulatory Visit: Payer: Self-pay

## 2020-09-25 DIAGNOSIS — R0602 Shortness of breath: Secondary | ICD-10-CM

## 2020-09-25 DIAGNOSIS — R9439 Abnormal result of other cardiovascular function study: Secondary | ICD-10-CM | POA: Insufficient documentation

## 2020-10-11 ENCOUNTER — Other Ambulatory Visit: Payer: Self-pay | Admitting: Internal Medicine

## 2020-10-11 DIAGNOSIS — Z1231 Encounter for screening mammogram for malignant neoplasm of breast: Secondary | ICD-10-CM

## 2020-10-23 ENCOUNTER — Ambulatory Visit
Admission: RE | Admit: 2020-10-23 | Discharge: 2020-10-23 | Disposition: A | Discharge status: Home / Self Care | Payer: BC Managed Care – PPO | Source: Ambulatory Visit | Attending: Internal Medicine | Admitting: Internal Medicine

## 2020-10-23 ENCOUNTER — Other Ambulatory Visit: Payer: Self-pay

## 2020-10-23 DIAGNOSIS — Z1231 Encounter for screening mammogram for malignant neoplasm of breast: Secondary | ICD-10-CM | POA: Diagnosis not present

## 2020-11-06 ENCOUNTER — Ambulatory Visit: Payer: BC Managed Care – PPO | Admitting: Cardiovascular Disease

## 2020-11-07 ENCOUNTER — Ambulatory Visit: Payer: BC Managed Care – PPO | Admitting: Internal Medicine

## 2020-11-08 ENCOUNTER — Ambulatory Visit (INDEPENDENT_AMBULATORY_CARE_PROVIDER_SITE_OTHER): Payer: BC Managed Care – PPO | Admitting: Internal Medicine

## 2020-11-08 ENCOUNTER — Other Ambulatory Visit: Payer: Self-pay

## 2020-11-08 ENCOUNTER — Other Ambulatory Visit (INDEPENDENT_AMBULATORY_CARE_PROVIDER_SITE_OTHER): Payer: BC Managed Care – PPO

## 2020-11-08 ENCOUNTER — Encounter: Payer: Self-pay | Admitting: Internal Medicine

## 2020-11-08 VITALS — BP 132/80 | HR 67 | Temp 97.5°F | Resp 16 | Ht 64.0 in | Wt 131.4 lb

## 2020-11-08 DIAGNOSIS — Z1211 Encounter for screening for malignant neoplasm of colon: Secondary | ICD-10-CM | POA: Diagnosis not present

## 2020-11-08 DIAGNOSIS — R195 Other fecal abnormalities: Secondary | ICD-10-CM | POA: Diagnosis not present

## 2020-11-08 DIAGNOSIS — I10 Essential (primary) hypertension: Secondary | ICD-10-CM

## 2020-11-08 DIAGNOSIS — R739 Hyperglycemia, unspecified: Secondary | ICD-10-CM

## 2020-11-08 DIAGNOSIS — Z86010 Personal history of colonic polyps: Secondary | ICD-10-CM

## 2020-11-08 DIAGNOSIS — Z85820 Personal history of malignant melanoma of skin: Secondary | ICD-10-CM

## 2020-11-08 DIAGNOSIS — R319 Hematuria, unspecified: Secondary | ICD-10-CM | POA: Diagnosis not present

## 2020-11-08 DIAGNOSIS — R0602 Shortness of breath: Secondary | ICD-10-CM

## 2020-11-08 DIAGNOSIS — E7800 Pure hypercholesterolemia, unspecified: Secondary | ICD-10-CM

## 2020-11-08 DIAGNOSIS — E039 Hypothyroidism, unspecified: Secondary | ICD-10-CM

## 2020-11-08 LAB — FECAL OCCULT BLOOD, IMMUNOCHEMICAL: Fecal Occult Bld: POSITIVE — AB

## 2020-11-08 NOTE — Progress Notes (Signed)
Patient ID: Kathryn Francis, female   DOB: [birth year], 60 y.o.   MRN: [id number]   Subjective:    Patient ID: Kathryn Francis, female    DOB: 1961-02-14, 60 y.o.   MRN: [id number]  HPI This visit occurred during the SARS-CoV-2 public health emergency.  Safety protocols were in place, including screening questions prior to the visit, additional usage of staff PPE, and extensive cleaning of exam room while observing appropriate contact time as indicated for disinfecting solutions.  Patient here for a scheduled follow up.  Here to follow up regarding her blood pressure and cholesterol.  Also has been having increased sob with inclines noted after covid infection. Had covid 07/2019.  CT chest 06/2020 - no evidence of post covid 19 fibrosis.  PFT 07/2020 - essentially normal. ECHO 08/23/20 - EF 88-32%, grade 1 diastolic dysfunction and mild mitral regurgitation.  Had cardiopulmonary exercise test (results reviewed) and further cardiac evaluation was recommended.  Reports increased sob with inclines.  She is very active.  Exercises regularly.  This is a change for her.  No chest pain.  Eating.  No nausea or vomiting reported.  Bowels moving.    Past Medical History:  Diagnosis Date   Hypercholesterolemia    Hypertension    Hypothyroidism    Melanoma ([place])    Patella fracture    right   Past Surgical History:  Procedure Laterality Date   BUNIONECTOMY  1999, 2000   ORIF ELBOW FRACTURE Left 06/16/2018   Procedure: OPEN REDUCTION INTERNAL FIXATION (ORIF) ELBOW/OLECRANON FRACTURE;  Surgeon: Hessie Knows, MD;  Location: [facility] ORS;  Service: Orthopedics;  Laterality: Left;   Family History  Problem Relation Age of Onset   Hypertension Other        siblings   Hypercholesterolemia Other        siblings   Heart disease Other        parent   Hypertension Other        parent   Lupus Other        sister   Breast cancer Neg Hx    Social History   Socioeconomic History   Marital  status: Married    Spouse name: Not on file   Number of children: 2   Years of education: Not on file   Highest education level: Not on file  Occupational History   Not on file  Tobacco Use   Smoking status: Never Smoker   Smokeless tobacco: Never Used  Substance and Sexual Activity   Alcohol use: No    Alcohol/week: 0.0 standard drinks   Drug use: No   Sexual activity: Not on file  Other Topics Concern   Not on file  Social History Narrative   Not on file   Social Determinants of Health   Financial Resource Strain: Not on file  Food Insecurity: Not on file  Transportation Needs: Not on file  Physical Activity: Not on file  Stress: Not on file  Social Connections: Not on file    Outpatient Encounter Medications as of 11/08/2020  Medication Sig   alendronate (FOSAMAX) 70 MG tablet Take 70 mg by mouth once a week.   atorvastatin (LIPITOR) 10 MG tablet TAKE 1 TABLET BY MOUTH EVERY DAY   hydrochlorothiazide (MICROZIDE) 12.5 MG capsule Take 12.5 mg by mouth daily.   levothyroxine (SYNTHROID) 75 MCG tablet TAKE 1 TABLET BY MOUTH EVERY DAY   metoprolol tartrate (LOPRESSOR) 25 MG tablet TAKE 1/2 TABLET BY MOUTH EVERY DAY  Multiple Vitamin (MULTIVITAMIN) tablet Take 1 tablet by mouth daily.   triamcinolone cream (KENALOG) 0.1 % APPLY TO AFFECTED AREA TWICE A DAY   zolpidem (AMBIEN) 5 MG tablet TAKE 1 TABLET BY MOUTH EVERY DAY AT BEDTIME AS NEEDED   No facility-administered encounter medications on file as of 11/08/2020.    Review of Systems  Constitutional: Negative for appetite change and unexpected weight change.  HENT: Negative for congestion and sinus pressure.   Respiratory: Negative for cough, chest tightness and shortness of breath.   Cardiovascular: Negative for chest pain, palpitations and leg swelling.  Gastrointestinal: Negative for abdominal pain, diarrhea, nausea and vomiting.  Genitourinary: Negative for difficulty urinating and dysuria.   Musculoskeletal: Negative for joint swelling and myalgias.  Skin: Negative for color change and rash.  Neurological: Negative for dizziness, light-headedness and headaches.  Psychiatric/Behavioral: Negative for agitation and dysphoric mood.       Objective:    Physical Exam Vitals reviewed.  Constitutional:      General: She is not in acute distress.    Appearance: Normal appearance.  HENT:     Head: Normocephalic and atraumatic.     Right Ear: External ear normal.     Left Ear: External ear normal.     Mouth/Throat:     Mouth: Oropharynx is clear and moist.  Eyes:     General: No scleral icterus.       Right eye: No discharge.        Left eye: No discharge.     Conjunctiva/sclera: Conjunctivae normal.  Neck:     Thyroid: No thyromegaly.  Cardiovascular:     Rate and Rhythm: Normal rate and regular rhythm.  Pulmonary:     Effort: No respiratory distress.     Breath sounds: Normal breath sounds. No wheezing.  Abdominal:     General: Bowel sounds are normal.     Palpations: Abdomen is soft.     Tenderness: There is no abdominal tenderness.  Musculoskeletal:        General: No swelling, tenderness or edema.     Cervical back: Neck supple. No tenderness.  Lymphadenopathy:     Cervical: No cervical adenopathy.  Skin:    Findings: No erythema or rash.  Neurological:     Mental Status: She is alert.  Psychiatric:        Mood and Affect: Mood normal.        Behavior: Behavior normal.     BP 132/80    Pulse 67    Temp (!) 97.5 F (36.4 C) (Oral)    Resp 16    Ht _0  (1.626 m)    Wt 131 lb 6.4 oz (59.6 kg)    SpO2 97%    BMI 22.55 kg/m  Wt Readings from Last 3 Encounters:  11/08/20 131 lb 6.4 oz (59.6 kg)  08/20/20 128 lb 6.4 oz (58.2 kg)  07/05/20 127 lb 3.2 oz (57.7 kg)     Lab Results  Component Value Date   WBC 4.8 11/07/2019   HGB 13.8 11/07/2019   HCT 40.9 11/07/2019   PLT 262.0 11/07/2019   GLUCOSE 96 07/17/2020   CHOL 206 (H) 05/11/2020   TRIG  357.0 (H) 05/11/2020   HDL 44.80 05/11/2020   LDLDIRECT 89.0 05/11/2020   LDLCALC 110 (H) 05/05/2014   ALT 25 05/11/2020   AST 22 05/11/2020   NA 135 07/17/2020   K 4.5 07/17/2020   CL 101 07/17/2020   CREATININE 0.90 07/17/2020  BUN 16 07/17/2020   CO2 25 07/17/2020   TSH 2.40 11/07/2019   HGBA1C 5.9 05/11/2020    MM 3D SCREEN BREAST BILATERAL  Result Date: 10/23/2020 CLINICAL DATA:  Screening. EXAM: DIGITAL SCREENING BILATERAL MAMMOGRAM WITH TOMO AND CAD COMPARISON:  Previous exam(s). ACR Breast Density Category b: There are scattered areas of fibroglandular density. FINDINGS: There are no findings suspicious for malignancy. Images were processed with CAD. IMPRESSION: No mammographic evidence of malignancy. A result letter of this screening mammogram will be mailed directly to the patient. RECOMMENDATION: Screening mammogram in one year. (Code:SM-B-01Y) BI-RADS CATEGORY  1: Negative. Electronically Signed   By: Lajean Manes M.D.   On: 10/23/2020 15:38       Assessment & Plan:   Problem List Items Addressed This Visit    Hematuria    05/11/20 urinalysis - no red blood cells present.       History of colonic polyps    Colonoscopy 06/2013.  Recommended f/u in 10 years.  Returned hemoccult cards today.        History of melanoma    Followed by dermatology.       Hypercholesterolemia    Low carb diet and exercise.  Follow lipid panel.  Discussed pending cardiac findings, may need to get more aggressive about cholesterol levels.       Relevant Orders   Hepatic function panel   Lipid panel   Hyperglycemia    Low carb diet and exercise.  Follow met b and a1c.       Relevant Orders   Hemoglobin A1c   Hypertension    Continue hctz and metoprolol.  Blood pressure recheck improved.  Follow pressures.  Follow metabolic panel.       Relevant Orders   CBC with Differential/Platelet   Basic metabolic panel   Hypothyroidism    On thyroid replacement.  Follow tsh.        Relevant Orders   TSH   SOB (shortness of breath)    SOB with inclines.  Pulmonary w/up as outlined.  Planning for cardiac w/up.         Other Visit Diagnoses    Heme positive stool    -  Primary   Relevant Orders   IBC + Ferritin       Einar Pheasant, MD

## 2020-11-09 ENCOUNTER — Encounter: Payer: Self-pay | Admitting: Internal Medicine

## 2020-11-09 NOTE — Assessment & Plan Note (Signed)
Colonoscopy 06/2013.  Recommended f/u in 10 years.  Returned hemoccult cards today.   

## 2020-11-09 NOTE — Assessment & Plan Note (Signed)
05/11/20 urinalysis - no red blood cells present.

## 2020-11-09 NOTE — Assessment & Plan Note (Signed)
SOB with inclines.  Pulmonary w/up as outlined.  Planning for cardiac w/up.

## 2020-11-09 NOTE — Assessment & Plan Note (Signed)
On thyroid replacement.  Follow tsh.  

## 2020-11-09 NOTE — Assessment & Plan Note (Signed)
Low carb diet and exercise.  Follow lipid panel.  Discussed pending cardiac findings, may need to get more aggressive about cholesterol levels.

## 2020-11-09 NOTE — Assessment & Plan Note (Signed)
Continue hctz and metoprolol.  Blood pressure recheck improved.  Follow pressures.  Follow metabolic panel.

## 2020-11-09 NOTE — Assessment & Plan Note (Signed)
Followed by dermatology

## 2020-11-09 NOTE — Assessment & Plan Note (Signed)
Low carb diet and exercise.  Follow met b and a1c.

## 2020-11-10 ENCOUNTER — Other Ambulatory Visit: Payer: Self-pay | Admitting: Internal Medicine

## 2020-11-10 DIAGNOSIS — R195 Other fecal abnormalities: Secondary | ICD-10-CM

## 2020-11-10 NOTE — Progress Notes (Signed)
Order placed for GI referral.   

## 2020-11-19 ENCOUNTER — Other Ambulatory Visit
Admission: RE | Admit: 2020-11-19 | Discharge: 2020-11-19 | Disposition: A | Discharge status: Home / Self Care | Payer: BC Managed Care – PPO | Attending: Internal Medicine | Admitting: Internal Medicine

## 2020-11-19 DIAGNOSIS — I10 Essential (primary) hypertension: Secondary | ICD-10-CM

## 2020-11-19 DIAGNOSIS — E039 Hypothyroidism, unspecified: Secondary | ICD-10-CM

## 2020-11-19 DIAGNOSIS — R0602 Shortness of breath: Secondary | ICD-10-CM | POA: Diagnosis not present

## 2020-11-19 DIAGNOSIS — U099 Post covid-19 condition, unspecified: Secondary | ICD-10-CM | POA: Insufficient documentation

## 2020-11-19 DIAGNOSIS — E7800 Pure hypercholesterolemia, unspecified: Secondary | ICD-10-CM

## 2020-11-19 DIAGNOSIS — R739 Hyperglycemia, unspecified: Secondary | ICD-10-CM | POA: Diagnosis not present

## 2020-11-19 LAB — BASIC METABOLIC PANEL
Anion gap: 10 (ref 5–15)
BUN: 18 mg/dL (ref 6–20)
CO2: 26 mmol/L (ref 22–32)
Calcium: 9.6 mg/dL (ref 8.9–10.3)
Chloride: 103 mmol/L (ref 98–111)
Creatinine, Ser: 0.87 mg/dL (ref 0.44–1.00)
GFR, Estimated: >60 mL/min (ref >60)
Glucose, Bld: 101 mg/dL — ABNORMAL HIGH (ref 70–99)
Potassium: 4.2 mmol/L (ref 3.5–5.1)
Sodium: 139 mmol/L (ref 135–145)

## 2020-11-19 LAB — CBC WITH DIFFERENTIAL/PLATELET
Abs Immature Granulocytes: 0.02 10*3/uL (ref 0.00–0.07)
Basophils Absolute: 0 10*3/uL (ref 0.0–0.1)
Basophils Relative: 1 %
Eosinophils Absolute: 0.2 10*3/uL (ref 0.0–0.5)
Eosinophils Relative: 4 %
HCT: 39.5 % (ref 36.0–46.0)
Hemoglobin: 12.9 g/dL (ref 12.0–15.0)
Immature Granulocytes: 0 %
Lymphocytes Relative: 30 %
Lymphs Abs: 1.8 10*3/uL (ref 0.7–4.0)
MCH: 28 pg (ref 26.0–34.0)
MCHC: 32.7 g/dL (ref 30.0–36.0)
MCV: 85.7 fL (ref 80.0–100.0)
Monocytes Absolute: 0.5 10*3/uL (ref 0.1–1.0)
Monocytes Relative: 9 %
Neutro Abs: 3.4 10*3/uL (ref 1.7–7.7)
Neutrophils Relative %: 56 %
Platelets: 229 10*3/uL (ref 150–400)
RBC: 4.61 MIL/uL (ref 3.87–5.11)
RDW: 12.9 % (ref 11.5–15.5)
WBC: 6.1 10*3/uL (ref 4.0–10.5)
nRBC: 0 % (ref 0.0–0.2)

## 2020-11-19 LAB — TSH: TSH: 2.106 u[IU]/mL (ref 0.350–4.500)

## 2020-11-19 LAB — LIPID PANEL
Cholesterol: 272 mg/dL — ABNORMAL HIGH (ref 0–200)
HDL: 56 mg/dL (ref >40)
LDL Cholesterol: UNDETERMINED mg/dL (ref 0–99)
Total CHOL/HDL Ratio: 4.9 RATIO
Triglycerides: 428 mg/dL — ABNORMAL HIGH (ref <150)
VLDL: UNDETERMINED mg/dL (ref 0–40)

## 2020-11-19 LAB — HEPATIC FUNCTION PANEL
ALT: 31 U/L (ref 0–44)
AST: 29 U/L (ref 15–41)
Albumin: 4.4 g/dL (ref 3.5–5.0)
Alkaline Phosphatase: 48 U/L (ref 38–126)
Bilirubin, Direct: <0.1 mg/dL (ref 0.0–0.2)
Total Bilirubin: 0.6 mg/dL (ref 0.3–1.2)
Total Protein: 7.6 g/dL (ref 6.5–8.1)

## 2020-11-19 LAB — LDL CHOLESTEROL, DIRECT: Direct LDL: 130.5 mg/dL — ABNORMAL HIGH (ref 0–99)

## 2020-11-19 LAB — FERRITIN: Ferritin: 18 ng/mL (ref 11–307)

## 2020-11-19 LAB — IRON AND TIBC
Iron: 62 ug/dL (ref 28–170)
Saturation Ratios: 12 % (ref 10.4–31.8)
TIBC: 531 ug/dL — ABNORMAL HIGH (ref 250–450)
UIBC: 469 ug/dL

## 2020-11-19 LAB — FIBRIN DERIVATIVES D-DIMER (ARMC ONLY): Fibrin derivatives D-dimer (ARMC): 256.79 ng/mL (FEU) (ref 0.00–499.00)

## 2020-11-19 LAB — HEMOGLOBIN A1C
Hgb A1c MFr Bld: 5.7 % — ABNORMAL HIGH (ref 4.8–5.6)
Mean Plasma Glucose: 116.89 mg/dL

## 2020-11-20 ENCOUNTER — Encounter: Payer: Self-pay | Admitting: Internal Medicine

## 2020-11-21 NOTE — Telephone Encounter (Signed)
Per 11/08/20 note, it appears you spoke to her about her stool test. Please confirm pt aware and that is why we referred her to GI

## 2020-11-21 NOTE — Telephone Encounter (Signed)
Will notify with results.

## 2020-12-07 ENCOUNTER — Other Ambulatory Visit: Payer: Self-pay | Admitting: Internal Medicine

## 2020-12-11 ENCOUNTER — Ambulatory Visit: Payer: BC Managed Care – PPO | Admitting: Cardiovascular Disease

## 2020-12-14 ENCOUNTER — Other Ambulatory Visit: Payer: BC Managed Care – PPO

## 2020-12-31 NOTE — Progress Notes (Signed)
Cardiology Office Note  Date:  01/01/2021   ID:  KODY VIGIL, DOB [birth year], MRN [id number]  PCP:  Einar Pheasant, MD   Chief Complaint  Patient presents with  . New Patient (Initial Visit)    Establish care with provider for abnormal pulmonary stress test and breathing issues. Medications verbally reviewed with patient.     HPI:  Ms. Destiney Sanabia is a 60 year old woman with past medical history of COVID in 2020 Hyperlipidemia Developing shortness of breath April 2021 Family history coronary disease Referred by Vernard Gambles for consultation of her abnormal stress test, shortness of breath  Developed shortness of breath early 2021, etiology unclear Exercises on a regular basis Feels her symptoms may have started after COVID Has had echocardiogram, CT scan as below, cardiopulmonary exercise study  Seen by pulmonary Results reviewed as below Echo November 2021 1. Left ventricular ejection fraction, by estimation, is 60 to 65%. The  left ventricle has normal function. The left ventricle has no regional  wall motion abnormalities. Left ventricular diastolic parameters are  consistent with Grade I diastolic  dysfunction (impaired relaxation).  2. Right ventricular systolic function is normal. The right ventricular  size is normal. There is normal pulmonary artery systolic pressure. The  estimated right ventricular systolic pressure is 83.1 mmHg.  3. The mitral valve is normal in structure. Mild mitral valve  regurgitation.   CT scan September 2021 Images reviewed, pulled up in the office, no significant coronary calcification or aortic atherosclerosis Negative CT chest. Specifically, no findings to suggest post COVID fibrosis.  Cardiopulmonary exercise test December 2021 Conclusion: Exercise testing with gas exchange demonstrates excellent functional capacity when compared to matched sedentary norms. With elevated VE/VCO2 slope and absence of desaturations,  patient appears primarily ventilatory limited as she is achieving her physiological limits and appears to be hyperventilating at peak exercise. Given the VE/VCO2 slope is elevated and there is mild up-sloing ST-depression, cardiology evaluation could be considered.  Exercise Time:  8:45  Speed (mph): 7.0    Grade (%): 8.0  RPE: 17  Reason stopped: leg fatigue and dyspnea (9/10)  Resting HR: 88 Standing HR: 90 Peak HR: 175  (109% age predicted max HR)  BP rest: 124/82 Standing BP: 110/78 BP peak: 168/64    EKG personally reviewed by myself on todays visit Shows normal sinus rhythm rate 63 bpm no significant ST or T wave changes   PMH:   has a past medical history of Hypercholesterolemia, Hypertension, Hypothyroidism, Melanoma ([place]), and Patella fracture.  PSH:    Past Surgical History:  Procedure Laterality Date  . [place], 2000  . ORIF ELBOW FRACTURE Left 06/16/2018   Procedure: OPEN REDUCTION INTERNAL FIXATION (ORIF) ELBOW/OLECRANON FRACTURE;  Surgeon: Hessie Knows, MD;  Location: [facility] ORS;  Service: Orthopedics;  Laterality: Left;    Current Outpatient Medications  Medication Sig Dispense Refill  . alendronate (FOSAMAX) 70 MG tablet Take 70 mg by mouth once a week.  11  . atorvastatin (LIPITOR) 10 MG tablet TAKE 1 TABLET BY MOUTH EVERY DAY 90 tablet 1  . hydrochlorothiazide (MICROZIDE) 12.5 MG capsule Take 12.5 mg by mouth daily.    Marland Kitchen levothyroxine (SYNTHROID) 75 MCG tablet TAKE 1 TABLET BY MOUTH EVERY DAY 90 tablet 3  . metoprolol tartrate (LOPRESSOR) 25 MG tablet TAKE 1/2 TABLET BY MOUTH EVERY DAY 45 tablet 1  . Multiple Vitamin (MULTIVITAMIN) tablet Take 1 tablet by mouth daily.    Marland Kitchen triamcinolone cream (KENALOG) 0.1 % APPLY TO AFFECTED  AREA TWICE A DAY 30 g 0  . zolpidem (AMBIEN) 5 MG tablet TAKE 1 TABLET BY MOUTH EVERY DAY AT BEDTIME AS NEEDED 30 tablet 0   No current facility-administered medications for this visit.     Allergies:   Patient has no known  allergies.   Social History:  The patient  reports that she has never smoked. She has never used smokeless tobacco. She reports that she does not drink alcohol and does not use drugs.   Family History:   family history includes Heart disease in an other family member; Hypercholesterolemia in an other family member; Hypertension in some other family members; Lupus in an other family member.    Review of Systems: Review of Systems  Constitutional: Negative.   HENT: Negative.   Respiratory: Positive for shortness of breath.   Cardiovascular: Negative.   Gastrointestinal: Negative.   Musculoskeletal: Negative.   Neurological: Negative.   Psychiatric/Behavioral: Negative.   All other systems reviewed and are negative.   PHYSICAL EXAM: VS:  BP 130/90 (BP Location: Right Arm, Patient Position: Sitting, Cuff Size: Normal)   Pulse 63   Ht 5\' 4"  (1.626 m)   Wt 133 lb (60.3 kg)   SpO2 98%   BMI 22.83 kg/m  , BMI Body mass index is 22.83 kg/m. GEN: Well nourished, well developed, in no acute distress HEENT: normal Neck: no JVD, carotid bruits, or masses Cardiac: RRR; no murmurs, rubs, or gallops,no edema  Respiratory:  clear to auscultation bilaterally, normal work of breathing GI: soft, nontender, nondistended, + BS MS: no deformity or atrophy Skin: warm and dry, no rash Neuro:  Strength and sensation are intact Psych: euthymic mood, full affect   Recent Labs: 11/19/2020: ALT 31; BUN 18; Creatinine, Ser 0.87; Hemoglobin 12.9; Platelets 229; Potassium 4.2; Sodium 139; TSH 2.106    Lipid Panel Lab Results  Component Value Date   CHOL 272 (H) 11/19/2020   HDL 56 11/19/2020   LDLCALC UNABLE TO CALCULATE IF TRIGLYCERIDE OVER 400 mg/dL 11/19/2020   TRIG 428 (H) 11/19/2020      Wt Readings from Last 3 Encounters:  01/01/21 133 lb (60.3 kg)  11/08/20 131 lb 6.4 oz (59.6 kg)  08/20/20 128 lb 6.4 oz (58.2 kg)      ASSESSMENT AND PLAN:  Problem List Items Addressed This Visit       Cardiology Problems   Hypertension - Primary   Hypercholesterolemia     Other   SOB (shortness of breath)   COVID-19     Shortness of breath Regular exercise tolerance as demonstrated on cardiopulmonary testing Exercise to 109% of her maximum predicted heart rate, Nonspecific ST changes at peak stress in the setting of no coronary calcification, no aortic atherosclerosis, good exercise tolerance, normal EKG, normal CT -Additional testing could be performed especially for any symptoms concerning for angina -For now medical management recommended -If she does develop worsening symptoms of shortness of breath or chest tightness when she goes to the gym (on a regular basis), she will call us, we will order a cardiac CTA which would give [place] the picture quality of a heart catheterization -She is inclined to do no further testing at this time but will call us if symptoms get worse for additional testing -ST changes as above at peak stress likely nonspecific finding  Hyperlipidemia Strong family history coronary disease Discussed adding Zetia to her low-dose statin which she takes 3 times a week She would like [place] to check with Dr. Nicki Reaper,  will send a message Given she is aggressive with her health, strong family history, would be inclined to treat aggressively  Hypertension Blood pressure is well controlled on today's visit. No changes made to the medications.     Total encounter time more than 60 minutes  Greater than 50% was spent in counseling and coordination of care with the patient  Patient was seen in referral for Dr. Patsey Berthold will be referred back to her office for ongoing care of the issues detailed above  Signed, Esmond Plants, M.D., Ph.D. [place], [place]

## 2021-01-01 ENCOUNTER — Encounter: Payer: Self-pay | Admitting: Cardiovascular Disease

## 2021-01-01 ENCOUNTER — Other Ambulatory Visit: Payer: Self-pay

## 2021-01-01 ENCOUNTER — Ambulatory Visit: Payer: BC Managed Care – PPO | Admitting: Cardiovascular Disease

## 2021-01-01 VITALS — BP 130/90 | HR 63 | Ht 64.0 in | Wt 133.0 lb

## 2021-01-01 DIAGNOSIS — I10 Essential (primary) hypertension: Secondary | ICD-10-CM | POA: Diagnosis not present

## 2021-01-01 DIAGNOSIS — R0602 Shortness of breath: Secondary | ICD-10-CM

## 2021-01-01 DIAGNOSIS — E7800 Pure hypercholesterolemia, unspecified: Secondary | ICD-10-CM | POA: Diagnosis not present

## 2021-01-01 DIAGNOSIS — U071 COVID-19: Secondary | ICD-10-CM

## 2021-01-01 NOTE — Patient Instructions (Addendum)
Talk with Dr. Nicki Reaper about Zetia 10 mg once a day   Medication Instructions:  No changes  If you need a refill on your cardiac medications before your next appointment, please call your pharmacy.    Lab work: No new labs needed   If you have labs (blood work) drawn today and your tests are completely normal, you will receive your results only by: Marland Kitchen MyChart Message (if you have MyChart) OR . A paper copy in the mail If you have any lab test that is abnormal or we need to change your treatment, we will call you to review the results.   Testing/Procedures: No new testing needed   Follow-Up: At [facility], you and your health needs are our priority.  As part of our continuing mission to provide you with exceptional heart care, we have created designated Provider Care Teams.  These Care Teams include your primary Cardiologist (physician) and Advanced Practice Providers (APPs -  Physician Assistants and Nurse Practitioners) who all work together to provide you with the care you need, when you need it.  . You will need a follow up appointment as needed  . Providers on your designated Care Team:   . Murray Hodgkins, NP . Christell Faith, PA-C . Marrianne Mood, PA-C  Any Other Special Instructions Will Be Listed Below (If Applicable).  COVID-19 Vaccine Information can be found at: ShippingScam.co.uk For questions related to vaccine distribution or appointments, please email vaccine@[email or web address] .[email or web address] or call [phone].

## 2021-01-01 NOTE — Progress Notes (Signed)
I am ok with starting zetia.  Let me know if you want me to send in the prescription.  Thank you for seeing her.     Kathryn Francis

## 2021-01-02 ENCOUNTER — Other Ambulatory Visit: Payer: Self-pay | Admitting: Internal Medicine

## 2021-01-02 LAB — HM COLONOSCOPY

## 2021-01-04 NOTE — Progress Notes (Signed)
Would recommend to the patient restart Zetia 10 mg daily Okay with primary care, Dr. Nicki Reaper

## 2021-01-04 NOTE — Progress Notes (Signed)
Noted  

## 2021-01-07 ENCOUNTER — Telehealth: Payer: Self-pay

## 2021-01-07 MED ORDER — EZETIMIBE 10 MG PO TABS: 10.0000 mg | ORAL_TABLET | Freq: Every day | ORAL | 3 refills | Status: DC

## 2021-01-07 NOTE — Telephone Encounter (Signed)
Able to reach pt regarding new cholesterol medication, Dr. Rockey Situ stated" Would recommend to the patient restart Zetia 10 mg daily Okay with primary care, Dr. Nicki Reaper" Kathryn Francis is in agreement to try Zetia, sent in to [facility] as requested.

## 2021-03-11 ENCOUNTER — Other Ambulatory Visit: Payer: Self-pay | Admitting: Internal Medicine

## 2021-03-22 ENCOUNTER — Other Ambulatory Visit: Payer: Self-pay | Admitting: Internal Medicine

## 2021-03-23 ENCOUNTER — Encounter: Payer: Self-pay | Admitting: Internal Medicine

## 2021-04-17 ENCOUNTER — Encounter: Payer: Self-pay | Admitting: Internal Medicine

## 2021-05-13 ENCOUNTER — Encounter: Payer: BC Managed Care – PPO | Admitting: Internal Medicine

## 2021-05-15 ENCOUNTER — Encounter: Payer: Self-pay | Admitting: Internal Medicine

## 2021-05-15 ENCOUNTER — Other Ambulatory Visit: Payer: Self-pay

## 2021-05-15 ENCOUNTER — Other Ambulatory Visit (INDEPENDENT_AMBULATORY_CARE_PROVIDER_SITE_OTHER): Payer: BC Managed Care – PPO

## 2021-05-15 ENCOUNTER — Other Ambulatory Visit: Payer: Self-pay | Admitting: Internal Medicine

## 2021-05-15 DIAGNOSIS — R739 Hyperglycemia, unspecified: Secondary | ICD-10-CM

## 2021-05-15 DIAGNOSIS — E7800 Pure hypercholesterolemia, unspecified: Secondary | ICD-10-CM

## 2021-05-15 DIAGNOSIS — R195 Other fecal abnormalities: Secondary | ICD-10-CM | POA: Diagnosis not present

## 2021-05-15 DIAGNOSIS — I10 Essential (primary) hypertension: Secondary | ICD-10-CM

## 2021-05-15 LAB — IBC + FERRITIN
Ferritin: 30.5 ng/mL (ref 10.0–291.0)
Iron: 101 ug/dL (ref 42–145)
Saturation Ratios: 20.9 % (ref 20.0–50.0)
Transferrin: 346 mg/dL (ref 212.0–360.0)

## 2021-05-15 LAB — LIPID PANEL
Cholesterol: 179 mg/dL (ref 0–200)
HDL: 46.7 mg/dL (ref >39.00)
NonHDL: 132.74
Total CHOL/HDL Ratio: 4
Triglycerides: 278 mg/dL — ABNORMAL HIGH (ref 0.0–149.0)
VLDL: 55.6 mg/dL — ABNORMAL HIGH (ref 0.0–40.0)

## 2021-05-15 LAB — CBC WITH DIFFERENTIAL/PLATELET
Basophils Absolute: 0 10*3/uL (ref 0.0–0.1)
Basophils Relative: 0.9 % (ref 0.0–3.0)
Eosinophils Absolute: 0.1 10*3/uL (ref 0.0–0.7)
Eosinophils Relative: 2.1 % (ref 0.0–5.0)
HCT: 42.2 % (ref 36.0–46.0)
Hemoglobin: 14 g/dL (ref 12.0–15.0)
Lymphocytes Relative: 34.3 % (ref 12.0–46.0)
Lymphs Abs: 1.7 10*3/uL (ref 0.7–4.0)
MCHC: 33.1 g/dL (ref 30.0–36.0)
MCV: 84.4 fl (ref 78.0–100.0)
Monocytes Absolute: 0.5 10*3/uL (ref 0.1–1.0)
Monocytes Relative: 9.4 % (ref 3.0–12.0)
Neutro Abs: 2.7 10*3/uL (ref 1.4–7.7)
Neutrophils Relative %: 53.3 % (ref 43.0–77.0)
Platelets: 255 10*3/uL (ref 150.0–400.0)
RBC: 4.99 Mil/uL (ref 3.87–5.11)
RDW: 13.3 % (ref 11.5–15.5)
WBC: 5.1 10*3/uL (ref 4.0–10.5)

## 2021-05-15 LAB — HEPATIC FUNCTION PANEL
ALT: 26 U/L (ref 0–35)
AST: 25 U/L (ref 0–37)
Albumin: 4.6 g/dL (ref 3.5–5.2)
Alkaline Phosphatase: 41 U/L (ref 39–117)
Bilirubin, Direct: 0.1 mg/dL (ref 0.0–0.3)
Total Bilirubin: 0.5 mg/dL (ref 0.2–1.2)
Total Protein: 7.3 g/dL (ref 6.0–8.3)

## 2021-05-15 LAB — HEMOGLOBIN A1C: Hgb A1c MFr Bld: 6.1 % (ref 4.6–6.5)

## 2021-05-15 LAB — BASIC METABOLIC PANEL
BUN: 14 mg/dL (ref 6–23)
CO2: 27 mEq/L (ref 19–32)
Calcium: 9.6 mg/dL (ref 8.4–10.5)
Chloride: 99 mEq/L (ref 96–112)
Creatinine, Ser: 0.76 mg/dL (ref 0.40–1.20)
GFR: 84.62 mL/min (ref >60.00)
Glucose, Bld: 83 mg/dL (ref 70–99)
Potassium: 3.8 mEq/L (ref 3.5–5.1)
Sodium: 137 mEq/L (ref 135–145)

## 2021-05-15 LAB — LDL CHOLESTEROL, DIRECT: Direct LDL: 81 mg/dL

## 2021-05-15 NOTE — Addendum Note (Signed)
Addended by: Leeanne Rio on: 05/15/2021 10:25 AM   Modules accepted: Orders

## 2021-05-15 NOTE — Addendum Note (Signed)
Addended by: Leeanne Rio on: 05/15/2021 11:05 AM   Modules accepted: Orders

## 2021-05-15 NOTE — Addendum Note (Signed)
Addended by: Leeanne Rio on: 05/15/2021 11:23 AM   Modules accepted: Orders

## 2021-05-15 NOTE — Progress Notes (Signed)
Order placed for labs.

## 2021-06-09 IMAGING — DX DG CHEST 2V
2 series · 2 of 2 positions shown · non-contrast
Comparison: None.

CLINICAL DATA: Shortness of breath with exertion.

EXAM:
CHEST - 2 VIEW

[chest pa]
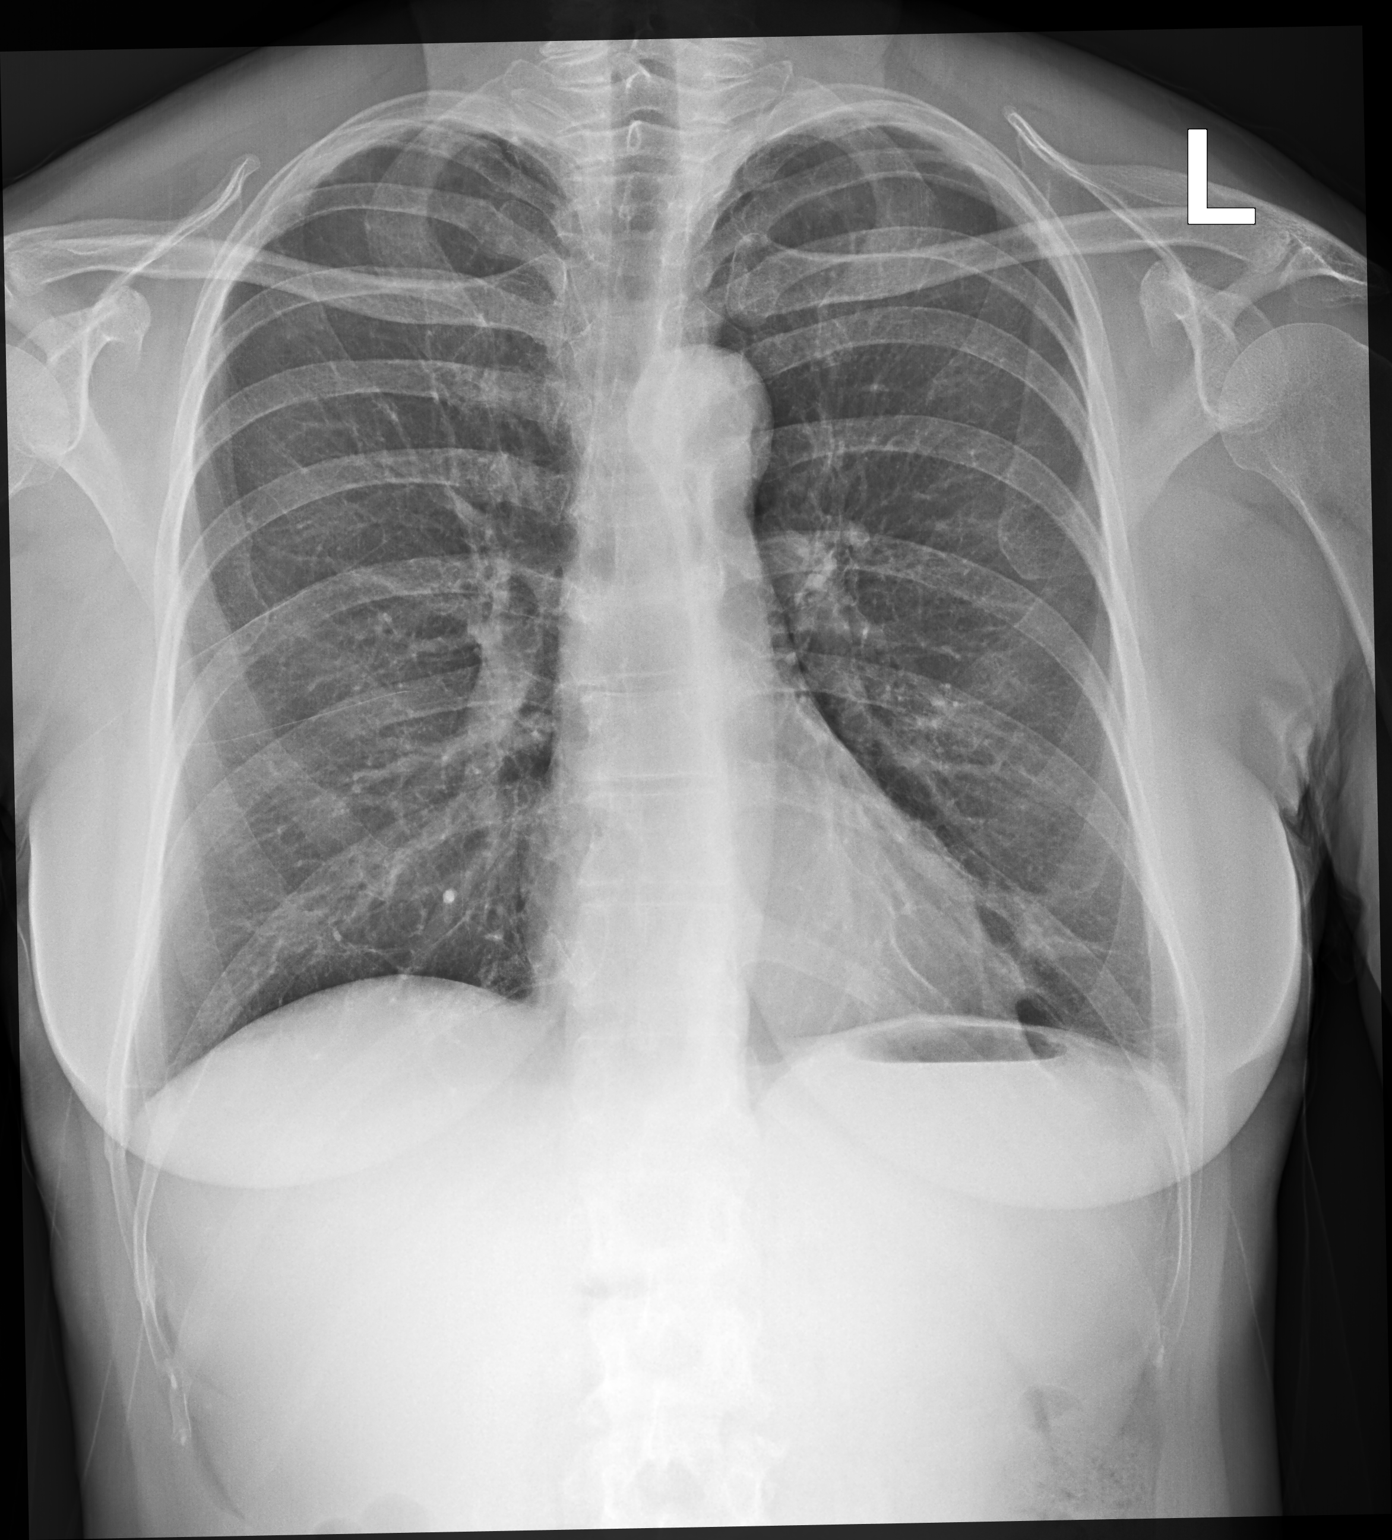

[chest lat]
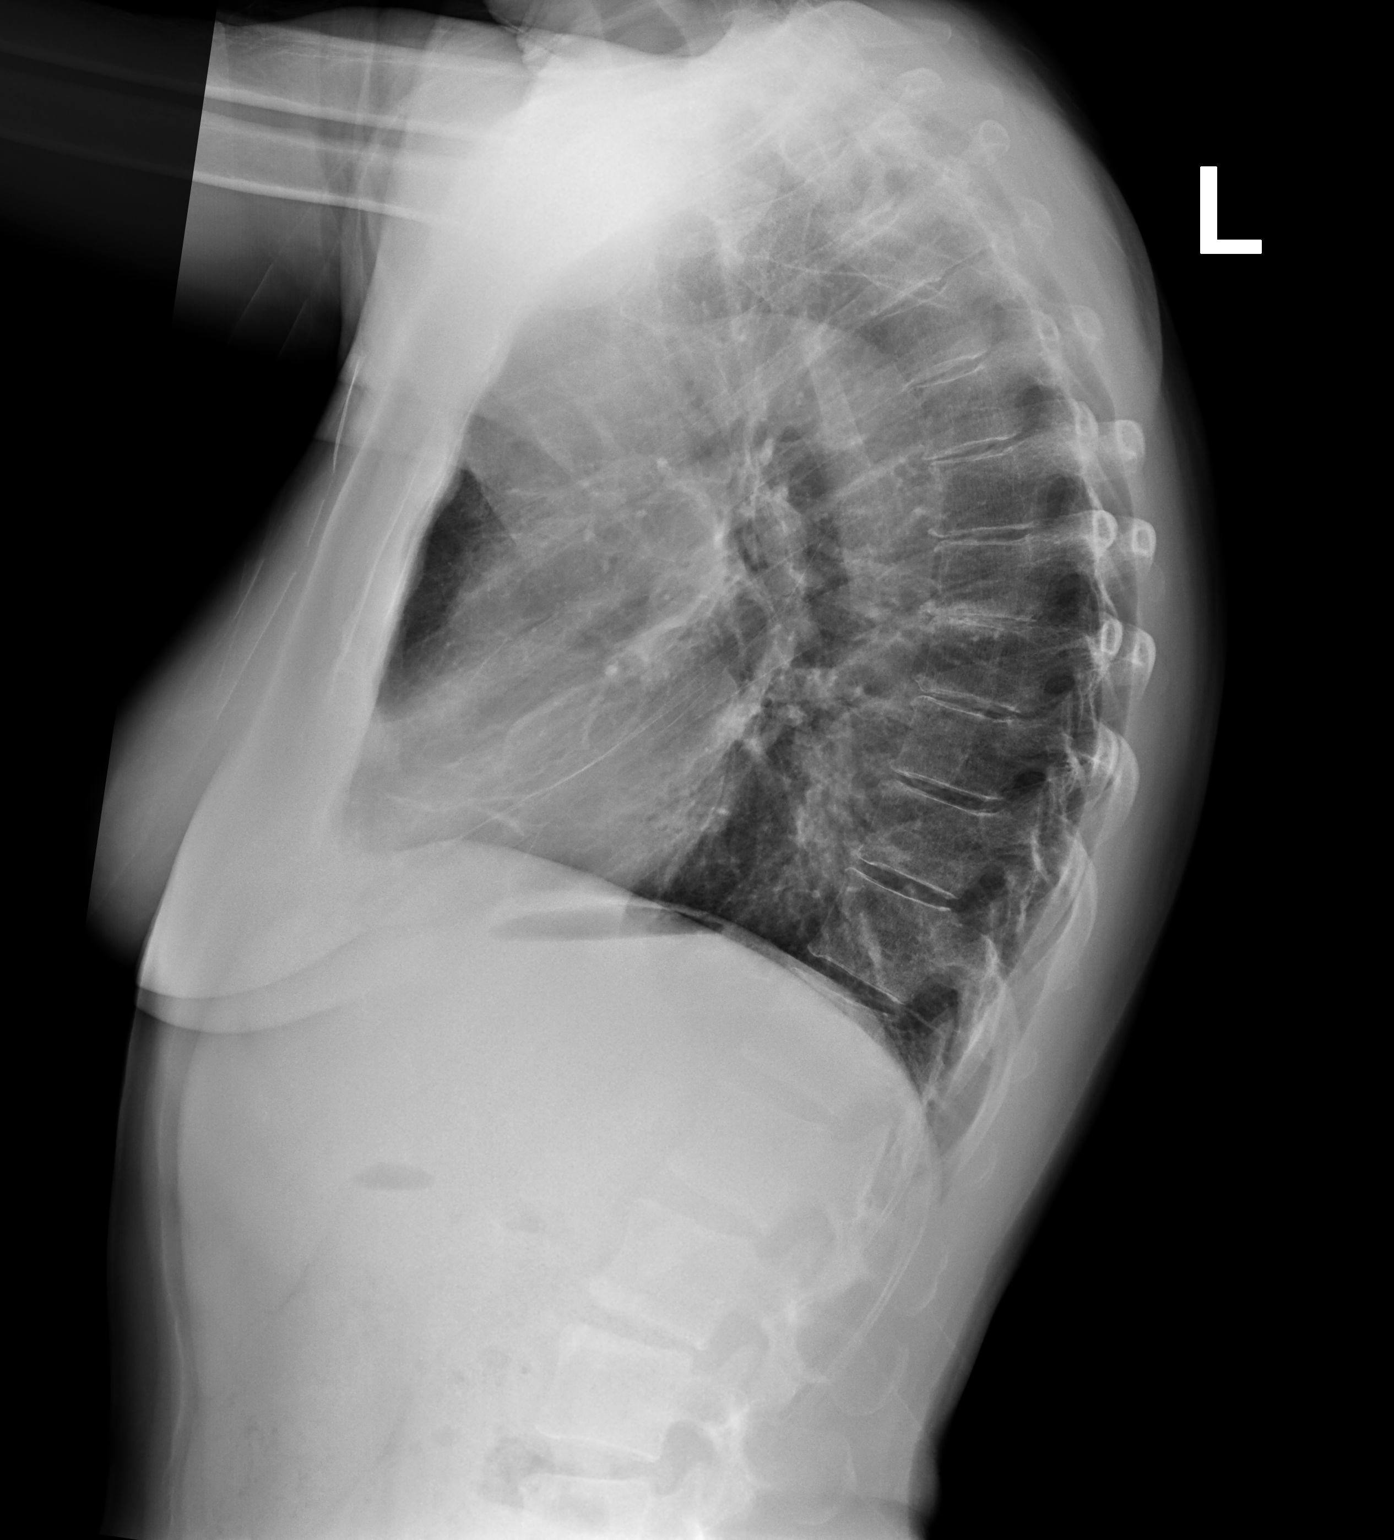

[2 of 2 positions shown; findings below may reference images not displayed]

FINDINGS: The cardiac silhouette, mediastinal and hilar contours are within
normal limits. Minimal streaky left basilar atelectasis or scarring.
No infiltrates, edema or effusions. No worrisome pulmonary lesions.
The bony thorax is intact.
IMPRESSION: Minimal left basilar atelectasis or scarring. No infiltrates or
effusions.

## 2021-07-24 ENCOUNTER — Ambulatory Visit (INDEPENDENT_AMBULATORY_CARE_PROVIDER_SITE_OTHER): Payer: BC Managed Care – PPO | Admitting: Internal Medicine

## 2021-07-24 ENCOUNTER — Telehealth: Payer: Self-pay | Admitting: Internal Medicine

## 2021-07-24 ENCOUNTER — Encounter: Payer: Self-pay | Admitting: Internal Medicine

## 2021-07-24 ENCOUNTER — Other Ambulatory Visit: Payer: Self-pay

## 2021-07-24 VITALS — BP 118/70 | HR 70 | Temp 97.9°F | Resp 16 | Ht 64.0 in | Wt 122.6 lb

## 2021-07-24 DIAGNOSIS — R739 Hyperglycemia, unspecified: Secondary | ICD-10-CM

## 2021-07-24 DIAGNOSIS — I10 Essential (primary) hypertension: Secondary | ICD-10-CM | POA: Diagnosis not present

## 2021-07-24 DIAGNOSIS — E7800 Pure hypercholesterolemia, unspecified: Secondary | ICD-10-CM

## 2021-07-24 DIAGNOSIS — Z0000 Encounter for general adult medical examination without abnormal findings: Secondary | ICD-10-CM

## 2021-07-24 DIAGNOSIS — E039 Hypothyroidism, unspecified: Secondary | ICD-10-CM | POA: Diagnosis not present

## 2021-07-24 MED ORDER — TIZANIDINE HCL 2 MG PO CAPS: 2.0000 mg | ORAL_CAPSULE | Freq: Every evening | ORAL | 0 refills | Status: DC | PRN

## 2021-07-24 MED ORDER — MELOXICAM 15 MG PO TABS: 15.0000 mg | ORAL_TABLET | Freq: Every day | ORAL | 0 refills | Status: DC

## 2021-07-24 NOTE — Assessment & Plan Note (Signed)
Physical today 07/24/21.  PAP 03/16/19 - negative with negative HPV.  Colonoscopy 01/02/21.  Mammogram 10/23/20 - Birads I.

## 2021-07-24 NOTE — Progress Notes (Signed)
Patient ID: Kathryn Francis, female   DOB: 10-Apr-1961, 60 y.o.   MRN: [id number]   Subjective:    Patient ID: Kathryn Francis, female    DOB: 1961/08/10, 60 y.o.   MRN: [id number]  This visit occurred during the SARS-CoV-2 public health emergency.  Safety protocols were in place, including screening questions prior to the visit, additional usage of staff PPE, and extensive cleaning of exam room while observing appropriate contact time as indicated for disinfecting solutions.   Patient here for her physical exam.   Chief Complaint  Patient presents with   Annual Exam   .   HPI Still exercising regularly.  Adjusted her diet.  Lost weight.  Has been working - increased work with Teacher, adult education.  Increased mid back pain.  Started over the past 7-10 days.  Has one more week of this type of work.  Has been taking ibuprofen.  Feels needs something to help alleviate discomfort.  Has taken muscle relaxers previously.  Tolerated.  No chest pain or sob reported.  No abdominal pain.  Bowels moving.     Past Medical History:  Diagnosis Date   Hypercholesterolemia    Hypertension    Hypothyroidism    Melanoma ([place])    Patella fracture    right   Past Surgical History:  Procedure Laterality Date   BUNIONECTOMY  1999, 2000   ORIF ELBOW FRACTURE Left 06/16/2018   Procedure: OPEN REDUCTION INTERNAL FIXATION (ORIF) ELBOW/OLECRANON FRACTURE;  Surgeon: Hessie Knows, MD;  Location: [facility] ORS;  Service: Orthopedics;  Laterality: Left;   Family History  Problem Relation Age of Onset   Hypertension Other        siblings   Hypercholesterolemia Other        siblings   Heart disease Other        parent   Hypertension Other        parent   Lupus Other        sister   Breast cancer Neg Hx    Social History   Socioeconomic History   Marital status: Married    Spouse name: Not on file   Number of children: 2   Years of education: Not on file   Highest education level: Not on file  Occupational  History   Not on file  Tobacco Use   Smoking status: Never   Smokeless tobacco: Never  Substance and Sexual Activity   Alcohol use: No    Alcohol/week: 0.0 standard drinks   Drug use: No   Sexual activity: Not on file  Other Topics Concern   Not on file  Social History Narrative   Not on file   Social Determinants of Health   Financial Resource Strain: Not on file  Food Insecurity: Not on file  Transportation Needs: Not on file  Physical Activity: Not on file  Stress: Not on file  Social Connections: Not on file     Review of Systems  Constitutional:  Negative for fatigue and unexpected weight change.  HENT:  Negative for congestion, sinus pressure and sore throat.   Eyes:  Negative for pain and visual disturbance.  Respiratory:  Negative for cough, chest tightness and shortness of breath.   Cardiovascular:  Negative for chest pain and palpitations.  Gastrointestinal:  Negative for abdominal pain, constipation and diarrhea.  Genitourinary:  Negative for difficulty urinating and frequency.  Musculoskeletal:  Negative for joint swelling.       Back pain as outlined.   Skin:  Negative for color change and rash.  Neurological:  Negative for dizziness and headaches.  Hematological:  Negative for adenopathy. Does not bruise/bleed easily.  Psychiatric/Behavioral:  Negative for decreased concentration and dysphoric mood.       Objective:     BP 118/70   Pulse 70   Temp 97.9 F (36.6 C)   Resp 16   Ht 5' 4" (1.626 m)   Wt 122 lb 9.6 oz (55.6 kg)   SpO2 99%   BMI 21.04 kg/m  Wt Readings from Last 3 Encounters:  07/24/21 122 lb 9.6 oz (55.6 kg)  01/01/21 133 lb (60.3 kg)  11/08/20 131 lb 6.4 oz (59.6 kg)    Physical Exam Vitals reviewed.  Constitutional:      General: She is not in acute distress.    Appearance: Normal appearance. She is well-developed.  HENT:     Head: Normocephalic and atraumatic.     Right Ear: External ear normal.     Left Ear: External  ear normal.  Eyes:     General: No scleral icterus.       Right eye: No discharge.        Left eye: No discharge.     Conjunctiva/sclera: Conjunctivae normal.  Neck:     Thyroid: No thyromegaly.  Cardiovascular:     Rate and Rhythm: Normal rate and regular rhythm.  Pulmonary:     Effort: No tachypnea, accessory muscle usage or respiratory distress.     Breath sounds: No decreased breath sounds, wheezing or rhonchi.  Chest:  Breasts:    Right: No inverted nipple, mass, nipple discharge or tenderness (no axillary adenopathy).     Left: No inverted nipple, mass, nipple discharge or tenderness (no axilarry adenopathy).  Abdominal:     General: Bowel sounds are normal.     Palpations: Abdomen is soft.     Tenderness: There is no abdominal tenderness.  Musculoskeletal:        General: No swelling or tenderness.     Cervical back: Neck supple.  Lymphadenopathy:     Cervical: No cervical adenopathy.  Skin:    Findings: No erythema or rash.  Neurological:     Mental Status: She is alert and oriented to person, place, and time.  Psychiatric:        Mood and Affect: Mood normal.        Behavior: Behavior normal.     Outpatient Encounter Medications as of 07/24/2021  Medication Sig   meloxicam (MOBIC) 15 MG tablet Take 1 tablet (15 mg total) by mouth daily.   tizanidine (ZANAFLEX) 2 MG capsule Take 1 capsule (2 mg total) by mouth at bedtime as needed for muscle spasms.   alendronate (FOSAMAX) 70 MG tablet Take 70 mg by mouth once a week.   atorvastatin (LIPITOR) 10 MG tablet TAKE 1 TABLET BY MOUTH EVERY DAY   ezetimibe (ZETIA) 10 MG tablet Take 1 tablet (10 mg total) by mouth daily.   hydrochlorothiazide (MICROZIDE) 12.5 MG capsule Take 12.5 mg by mouth daily.   levothyroxine (SYNTHROID) 75 MCG tablet TAKE 1 TABLET BY MOUTH EVERY DAY   metoprolol tartrate (LOPRESSOR) 25 MG tablet TAKE 1/2 TABLET BY MOUTH EVERY DAY   Multiple Vitamin (MULTIVITAMIN) tablet Take 1 tablet by mouth daily.    zolpidem (AMBIEN) 5 MG tablet TAKE 1 TABLET BY MOUTH EVERY DAY AT BEDTIME AS NEEDED   [DISCONTINUED] triamcinolone cream (KENALOG) 0.1 % APPLY TO AFFECTED AREA TWICE A DAY   No facility-administered  encounter medications on file as of 07/24/2021.     Lab Results  Component Value Date   WBC 5.1 05/15/2021   HGB 14.0 05/15/2021   HCT 42.2 05/15/2021   PLT 255.0 05/15/2021   GLUCOSE 83 05/15/2021   CHOL 179 05/15/2021   TRIG 278.0 (H) 05/15/2021   HDL 46.70 05/15/2021   LDLDIRECT 81.0 05/15/2021   LDLCALC UNABLE TO CALCULATE IF TRIGLYCERIDE OVER 400 mg/dL 11/19/2020   ALT 26 05/15/2021   AST 25 05/15/2021   NA 137 05/15/2021   K 3.8 05/15/2021   CL 99 05/15/2021   CREATININE 0.76 05/15/2021   BUN 14 05/15/2021   CO2 27 05/15/2021   TSH 2.106 11/19/2020   HGBA1C 6.1 05/15/2021       Assessment & Plan:   Problem List Items Addressed This Visit     Health care maintenance    Physical today 07/24/21.  PAP 03/16/19 - negative with negative HPV.  Colonoscopy 01/02/21.  Mammogram 10/23/20 - Birads I.       Hypercholesterolemia    Low carb diet and exercise.  Follow lipid panel. Triglycerides improved last check.  Follow.       Hyperglycemia    Watches her diet.  Has lost weight.  Exercises regularly.  Follow met b and a1c.   Lab Results  Component Value Date   HGBA1C 6.1 05/15/2021       Hypertension    Continue hctz and metoprolol.  Blood pressure recheck improved.  Follow pressures.  Follow metabolic panel.       Hypothyroidism    On thyroid replacement.  Follow tsh.         Einar Pheasant, MD

## 2021-07-24 NOTE — Telephone Encounter (Signed)
Patient scheduled for labs 12/23/21 as requested by check out note.   Needing orders placed.

## 2021-07-25 NOTE — Addendum Note (Signed)
Addended by: Lars Masson on: 07/25/2021 09:32 AM   Modules accepted: Orders

## 2021-07-25 NOTE — Telephone Encounter (Signed)
Future labs ordered.  

## 2021-07-29 ENCOUNTER — Encounter: Payer: Self-pay | Admitting: Internal Medicine

## 2021-07-29 NOTE — Assessment & Plan Note (Signed)
Continue hctz and metoprolol.  Blood pressure recheck improved.  Follow pressures.  Follow metabolic panel.

## 2021-07-29 NOTE — Assessment & Plan Note (Signed)
Watches her diet.  Has lost weight.  Exercises regularly.  Follow met b and a1c.   Lab Results  Component Value Date   HGBA1C 6.1 05/15/2021   

## 2021-07-29 NOTE — Assessment & Plan Note (Signed)
On thyroid replacement.  Follow tsh.  

## 2021-07-29 NOTE — Assessment & Plan Note (Signed)
Low carb diet and exercise.  Follow lipid panel. Triglycerides improved last check.  Follow.

## 2021-07-31 ENCOUNTER — Other Ambulatory Visit: Payer: Self-pay | Admitting: Internal Medicine

## 2021-08-19 ENCOUNTER — Other Ambulatory Visit: Payer: Self-pay | Admitting: Internal Medicine

## 2021-08-19 ENCOUNTER — Encounter: Payer: Self-pay | Admitting: Internal Medicine

## 2021-08-20 ENCOUNTER — Telehealth (INDEPENDENT_AMBULATORY_CARE_PROVIDER_SITE_OTHER): Payer: BC Managed Care – PPO | Admitting: Internal Medicine

## 2021-08-20 DIAGNOSIS — I10 Essential (primary) hypertension: Secondary | ICD-10-CM | POA: Diagnosis not present

## 2021-08-20 DIAGNOSIS — R051 Acute cough: Secondary | ICD-10-CM

## 2021-08-20 MED ORDER — AZITHROMYCIN 250 MG PO TABS: ORAL_TABLET | ORAL | 0 refills | Status: AC

## 2021-08-20 NOTE — Telephone Encounter (Signed)
Patient did virtual with Dr Nicki Reaper

## 2021-08-20 NOTE — Progress Notes (Signed)
Patient ID: [name], female   DOB: August 13, 1961, 60 y.o.   MRN: [id number]   Virtual Visit via video Note  This visit type was conducted due to national recommendations for restrictions regarding the COVID-19 pandemic (e.g. social distancing).  This format is felt to be most appropriate for this patient at this time.  All issues noted in this document were discussed and addressed.  No physical exam was performed (except for noted visual exam findings with Video Visits).   I connected with [name] by a video enabled telemedicine application or telephone and verified that I am speaking with the correct person using two identifiers. Location patient: home Location provider: work  Persons participating in the virtual visit: patient, provider  The limitations, risks, security and privacy concerns of performing an evaluation and management service by video and the availability of in person appointments have been discussed.  It has also been discussed with the patient that there may be a patient responsible charge related to this service. The patient expressed understanding and agreed to proceed.   Reason for visit: work in appt.   HPI: Work in appt for cough and congestion. Symptoms started with headache 08/18/21.  Yesterday am - dry cough.  Minimal nasal congestion.  No fever.  No nausea, vomiting or diarrhea.  No chest pain or tightness.  Husband was sick.  Recent travel.  Two covid tests (home tests) - negative.  Husband tested negative.  Taking dayquil/nyquil.     ROS: See pertinent positives and negatives per HPI.  Past Medical History:  Diagnosis Date   Hypercholesterolemia    Hypertension    Hypothyroidism    Melanoma ([place])    Patella fracture    right    Past Surgical History:  Procedure Laterality Date   BUNIONECTOMY  1999, 2000   ORIF ELBOW FRACTURE Left 06/16/2018   Procedure: OPEN REDUCTION INTERNAL FIXATION (ORIF) ELBOW/OLECRANON FRACTURE;  Surgeon: Hessie Knows, MD;  Location: [facility] ORS;  Service: Orthopedics;  Laterality: Left;    Family History  Problem Relation Age of Onset   Hypertension Other        siblings   Hypercholesterolemia Other        siblings   Heart disease Other        parent   Hypertension Other        parent   Lupus Other        sister   Breast cancer Neg Hx     SOCIAL HX: reviewed.    Current Outpatient Medications:    alendronate (FOSAMAX) 70 MG tablet, Take 70 mg by mouth once a week., Disp: , Rfl: 11   atorvastatin (LIPITOR) 10 MG tablet, TAKE 1 TABLET BY MOUTH EVERY DAY, Disp: 90 tablet, Rfl: 1   ezetimibe (ZETIA) 10 MG tablet, Take 1 tablet (10 mg total) by mouth daily., Disp: 90 tablet, Rfl: 3   hydrochlorothiazide (MICROZIDE) 12.5 MG capsule, Take 12.5 mg by mouth daily., Disp: , Rfl:    levothyroxine (SYNTHROID) 75 MCG tablet, TAKE 1 TABLET BY MOUTH EVERY DAY, Disp: 90 tablet, Rfl: 3   meloxicam (MOBIC) 15 MG tablet, TAKE 1 TABLET (15 MG TOTAL) BY MOUTH DAILY., Disp: 15 tablet, Rfl: 0   metoprolol tartrate (LOPRESSOR) 25 MG tablet, TAKE 1/2 TABLET BY MOUTH EVERY DAY, Disp: 45 tablet, Rfl: 1   Multiple Vitamin (MULTIVITAMIN) tablet, Take 1 tablet by mouth daily., Disp: , Rfl:    tizanidine (ZANAFLEX) 2 MG capsule, TAKE 1 CAPSULE (2  MG TOTAL) BY MOUTH AT BEDTIME AS NEEDED FOR MUSCLE SPASMS., Disp: 15 capsule, Rfl: 0   zolpidem (AMBIEN) 5 MG tablet, TAKE 1 TABLET BY MOUTH EVERY DAY AT BEDTIME AS NEEDED, Disp: 30 tablet, Rfl: 0  EXAM:  GENERAL: alert, oriented, appears well and in no acute distress  HEENT: atraumatic, conjunttiva clear, no obvious abnormalities on inspection of external nose and ears  NECK: normal movements of the head and neck  LUNGS: on inspection no signs of respiratory distress, breathing rate appears normal, no obvious gross SOB, gasping or wheezing  CV: no obvious cyanosis  PSYCH/NEURO: pleasant and cooperative, no obvious depression or anxiety, speech and thought processing  grossly intact  ASSESSMENT AND PLAN:  Discussed the following assessment and plan:  Problem List Items Addressed This Visit     Cough    Congestion and cough (dry) as outlined.  covid tests (home tests) negative.  Saline nasal spray/steroid nasal spray.  Mucinex/robitussin.  Delsym for cough.  Rest.  Fluids.  Discussed quarantine guidelines.  Recheck covid test in the next couple of days.        Hypertension    Continue hctz and metoprolol.  Follow pressures.         Return if symptoms worsen or fail to improve.   I discussed the assessment and treatment plan with the patient. The patient was provided an opportunity to ask questions and all were answered. The patient agreed with the plan and demonstrated an understanding of the instructions.   The patient was advised to call back or seek an in-person evaluation if the symptoms worsen or if the condition fails to improve as anticipated.   Einar Pheasant, MD

## 2021-08-24 ENCOUNTER — Other Ambulatory Visit: Payer: Self-pay | Admitting: Internal Medicine

## 2021-08-25 ENCOUNTER — Encounter: Payer: Self-pay | Admitting: Internal Medicine

## 2021-08-26 ENCOUNTER — Encounter: Payer: Self-pay | Admitting: Internal Medicine

## 2021-08-26 DIAGNOSIS — R059 Cough, unspecified: Secondary | ICD-10-CM | POA: Insufficient documentation

## 2021-08-26 NOTE — Assessment & Plan Note (Signed)
Congestion and cough (dry) as outlined.  covid tests (home tests) negative.  Saline nasal spray/steroid nasal spray.  Mucinex/robitussin.  Delsym for cough.  Rest.  Fluids.  Discussed quarantine guidelines.  Recheck covid test in the next couple of days.

## 2021-08-26 NOTE — Telephone Encounter (Signed)
Different work places have different protocols.  So they may have their own recommendations.  Just clarify with her when her symptoms started. It appears she is symptom free now.

## 2021-08-26 NOTE — Assessment & Plan Note (Addendum)
Continue hctz and metoprolol.  Follow pressures.

## 2021-08-26 NOTE — Telephone Encounter (Signed)
See unrouted message below/

## 2021-08-26 NOTE — Telephone Encounter (Signed)
Symptoms began 10/30. Her work [employer] is that she can return after 5 days. Just wanted to confirm with you that she did not need PCR test.

## 2021-09-03 ENCOUNTER — Other Ambulatory Visit: Payer: Self-pay | Admitting: Internal Medicine

## 2021-09-09 ENCOUNTER — Other Ambulatory Visit: Payer: Self-pay | Admitting: Internal Medicine

## 2021-09-15 ENCOUNTER — Other Ambulatory Visit: Payer: Self-pay | Admitting: Internal Medicine

## 2021-09-16 NOTE — Telephone Encounter (Signed)
LOV: 08/20/2021  NOV: 01/22/2022

## 2021-09-16 NOTE — Telephone Encounter (Signed)
Rx ok'd for ambien #30 with no refills.  

## 2021-11-21 ENCOUNTER — Other Ambulatory Visit: Payer: Self-pay | Admitting: Internal Medicine

## 2021-11-21 ENCOUNTER — Encounter: Payer: Self-pay | Admitting: Internal Medicine

## 2021-11-22 ENCOUNTER — Ambulatory Visit: Payer: BC Managed Care – PPO | Admitting: Internal Medicine

## 2021-11-22 ENCOUNTER — Other Ambulatory Visit: Payer: Self-pay

## 2021-11-22 ENCOUNTER — Other Ambulatory Visit: Payer: Self-pay | Admitting: Internal Medicine

## 2021-11-22 DIAGNOSIS — Z1231 Encounter for screening mammogram for malignant neoplasm of breast: Secondary | ICD-10-CM

## 2021-11-22 DIAGNOSIS — M5441 Lumbago with sciatica, right side: Secondary | ICD-10-CM | POA: Diagnosis not present

## 2021-11-22 DIAGNOSIS — I10 Essential (primary) hypertension: Secondary | ICD-10-CM

## 2021-11-22 MED ORDER — METHYLPREDNISOLONE 4 MG PO TBPK: ORAL_TABLET | ORAL | 0 refills | Status: DC

## 2021-11-22 NOTE — Telephone Encounter (Signed)
See if can come in at 10:00

## 2021-11-22 NOTE — Telephone Encounter (Signed)
Last filled 08/20/2021 okay to fill? Last visit was same date.

## 2021-11-22 NOTE — Telephone Encounter (Signed)
Patient says she is having pain in her lower right and and upper leg. She says it bothers her when she is bending down. No pain when walking. She thinks she may have aggravated something in her boot camp class on Tuesday. Wednesday she was sore and then Thursday was worse. She is requesting refill on meloxicam that she uses prn. Are you ok to fill for her to use for a few days?

## 2021-11-22 NOTE — Telephone Encounter (Signed)
Pt scheduled  

## 2021-11-22 NOTE — Progress Notes (Signed)
Patient ID: Kathryn Francis, female   DOB: [birth year], 61 y.o.   MRN: [id number]   Subjective:    Patient ID: Kathryn Francis, female    DOB: 1961/02/24, 61 y.o.   MRN: [id number]  This visit occurred during the SARS-CoV-2 public health emergency.  Safety protocols were in place, including screening questions prior to the visit, additional usage of staff PPE, and extensive cleaning of exam room while observing appropriate contact time as indicated for disinfecting solutions.   Patient here for work in appt.   Chief Complaint  Patient presents with   Back Pain   .   HPI Work in for back pain.  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  Nothing unusual with the class.  Lower impact.  Wednesday 2/1 - increased low back discomfort - tender.  Noticed increased discomfort yesterday.  Low back pain and pain down right leg.  Hurts to bend over or bend down to the ground.  No numbness or tingling.  No low of bladder or bowel control.  Taking antiinflammatories.  Took zanaflex.     Past Medical History:  Diagnosis Date   Hypercholesterolemia    Hypertension    Hypothyroidism    Melanoma ([place])    Patella fracture    right   Past Surgical History:  Procedure Laterality Date   BUNIONECTOMY  1999, 2000   ORIF ELBOW FRACTURE Left 06/16/2018   Procedure: OPEN REDUCTION INTERNAL FIXATION (ORIF) ELBOW/OLECRANON FRACTURE;  Surgeon: Hessie Knows, MD;  Location: [facility] ORS;  Service: Orthopedics;  Laterality: Left;   Family History  Problem Relation Age of Onset   Hypertension Other        siblings   Hypercholesterolemia Other        siblings   Heart disease Other        parent   Hypertension Other        parent   Lupus Other        sister   Breast cancer Neg Hx    Social History   Socioeconomic History   Marital status: Married    Spouse name: Not on file   Number of children: 2   Years of education: Not on file   Highest education level: Not on file  Occupational History   Not on  file  Tobacco Use   Smoking status: Never   Smokeless tobacco: Never  Substance and Sexual Activity   Alcohol use: No    Alcohol/week: 0.0 standard drinks   Drug use: No   Sexual activity: Not on file  Other Topics Concern   Not on file  Social History Narrative   Not on file   Social Determinants of Health   Financial Resource Strain: Not on file  Food Insecurity: Not on file  Transportation Needs: Not on file  Physical Activity: Not on file  Stress: Not on file  Social Connections: Not on file     Review of Systems  Constitutional:  Negative for appetite change and unexpected weight change.  HENT:  Negative for congestion and sinus pressure.   Respiratory:  Negative for cough, chest tightness and shortness of breath.   Cardiovascular:  Negative for chest pain, palpitations and leg swelling.  Gastrointestinal:  Negative for abdominal pain, diarrhea, nausea and vomiting.  Genitourinary:  Negative for difficulty urinating and dysuria.  Musculoskeletal:  Positive for back pain. Negative for joint swelling and myalgias.  Neurological:  Negative for dizziness and headaches.  Psychiatric/Behavioral:  Negative for agitation and  dysphoric mood.       Objective:     BP 120/70    Pulse 68    Temp 98 F (36.7 C)    Resp 16    Ht 5\' 3"  (1.6 m)    Wt 128 lb (58.1 kg)    SpO2 99%    BMI 22.67 kg/m  Wt Readings from Last 3 Encounters:  11/22/21 128 lb (58.1 kg)  07/24/21 122 lb 9.6 oz (55.6 kg)  01/01/21 133 lb (60.3 kg)    Physical Exam Vitals reviewed.  Constitutional:      General: She is not in acute distress.    Appearance: Normal appearance.  HENT:     Head: Normocephalic and atraumatic.     Right Ear: External ear normal.     Left Ear: External ear normal.  Eyes:     General: No scleral icterus.       Right eye: No discharge.        Left eye: No discharge.     Conjunctiva/sclera: Conjunctivae normal.  Neck:     Thyroid: No thyromegaly.  Cardiovascular:      Rate and Rhythm: Normal rate and regular rhythm.  Pulmonary:     Effort: No respiratory distress.     Breath sounds: Normal breath sounds. No wheezing.  Abdominal:     General: Bowel sounds are normal.     Palpations: Abdomen is soft.     Tenderness: There is no abdominal tenderness.  Musculoskeletal:        General: No swelling or tenderness.     Cervical back: Neck supple. No tenderness.     Comments: Increased back pain with bending.  Negative SLR.  Some discomfort straightening left leg - when in seated position.    Lymphadenopathy:     Cervical: No cervical adenopathy.  Skin:    Findings: No erythema or rash.  Neurological:     Mental Status: She is alert.  Psychiatric:        Mood and Affect: Mood normal.        Behavior: Behavior normal.     Outpatient Encounter Medications as of 11/22/2021  Medication Sig   methylPREDNISolone (MEDROL DOSEPAK) 4 MG TBPK tablet Medrol dose pack - 6 day taper.  Take as directed.   alendronate (FOSAMAX) 70 MG tablet Take 70 mg by mouth once a week.   atorvastatin (LIPITOR) 10 MG tablet TAKE 1 TABLET BY MOUTH EVERY DAY   ezetimibe (ZETIA) 10 MG tablet Take 1 tablet (10 mg total) by mouth daily.   hydrochlorothiazide (MICROZIDE) 12.5 MG capsule Take 12.5 mg by mouth daily.   levothyroxine (SYNTHROID) 75 MCG tablet TAKE 1 TABLET BY MOUTH EVERY DAY   meloxicam (MOBIC) 15 MG tablet TAKE 1 TABLET (15 MG TOTAL) BY MOUTH DAILY.   metoprolol tartrate (LOPRESSOR) 25 MG tablet TAKE 1/2 TABLET BY MOUTH EVERY DAY   Multiple Vitamin (MULTIVITAMIN) tablet Take 1 tablet by mouth daily.   tizanidine (ZANAFLEX) 2 MG capsule TAKE 1 CAPSULE (2 MG TOTAL) BY MOUTH AT BEDTIME AS NEEDED FOR MUSCLE SPASMS.   zolpidem (AMBIEN) 5 MG tablet TAKE 1 TABLET BY MOUTH EVERY DAY AT BEDTIME AS NEEDED   No facility-administered encounter medications on file as of 11/22/2021.     Lab Results  Component Value Date   WBC 5.1 05/15/2021   HGB 14.0 05/15/2021   HCT 42.2  05/15/2021   PLT 255.0 05/15/2021   GLUCOSE 83 05/15/2021   CHOL 179 05/15/2021  TRIG 278.0 (H) 05/15/2021   HDL 46.70 05/15/2021   LDLDIRECT 81.0 05/15/2021   LDLCALC UNABLE TO CALCULATE IF TRIGLYCERIDE OVER 400 mg/dL 11/19/2020   ALT 26 05/15/2021   AST 25 05/15/2021   NA 137 05/15/2021   K 3.8 05/15/2021   CL 99 05/15/2021   CREATININE 0.76 05/15/2021   BUN 14 05/15/2021   CO2 27 05/15/2021   TSH 2.106 11/19/2020   HGBA1C 6.1 05/15/2021       Assessment & Plan:   Problem List Items Addressed This Visit     Hypertension    Continue hctz and metoprolol.  Blood pressure recheck improved.  Follow pressures.  Follow metabolic panel.       Low back pain    Low back pain and right leg pain as outlined.  Exam as outlined.  Continue zanaflex q hs.  Medrol dose pack as directed.  Avoid antiinflammatories while on steroids.  Tylenol if needed.  Follow.  Call with update.        Relevant Medications   methylPREDNISolone (MEDROL DOSEPAK) 4 MG TBPK tablet     Einar Pheasant, MD

## 2021-11-22 NOTE — Telephone Encounter (Signed)
Pt was seen today.  Prescribed medrol dosepack.  Will hold on refill of meloxicam.  Stated she had zanaflex.  Did not need currently.

## 2021-11-24 ENCOUNTER — Encounter: Payer: Self-pay | Admitting: Internal Medicine

## 2021-11-24 DIAGNOSIS — M545 Low back pain, unspecified: Secondary | ICD-10-CM | POA: Insufficient documentation

## 2021-11-24 NOTE — Assessment & Plan Note (Signed)
Continue hctz and metoprolol.  Blood pressure recheck improved.  Follow pressures.  Follow metabolic panel.

## 2021-11-24 NOTE — Assessment & Plan Note (Signed)
Low back pain and right leg pain as outlined.  Exam as outlined.  Continue zanaflex q hs.  Medrol dose pack as directed.  Avoid antiinflammatories while on steroids.  Tylenol if needed.  Follow.  Call with update.

## 2021-11-26 NOTE — Telephone Encounter (Signed)
Please call and confirm doing ok.  I had given her a medrol dose pack.  If she is having worsening symptoms despite the prednisone, I would recommend [facility] walk in.  May save her two trips.

## 2021-11-26 NOTE — Telephone Encounter (Signed)
She is doing ok. Prednisone was helping and now she is feeling the same as she was on Friday. She is going to go to [facility] walk in.

## 2021-12-23 ENCOUNTER — Other Ambulatory Visit: Payer: BC Managed Care – PPO

## 2021-12-26 ENCOUNTER — Ambulatory Visit
Admission: RE | Admit: 2021-12-26 | Discharge: 2021-12-26 | Disposition: A | Discharge status: Home / Self Care | Payer: BC Managed Care – PPO | Source: Ambulatory Visit | Attending: Internal Medicine | Admitting: Internal Medicine

## 2021-12-26 ENCOUNTER — Other Ambulatory Visit: Payer: Self-pay

## 2021-12-26 DIAGNOSIS — Z1231 Encounter for screening mammogram for malignant neoplasm of breast: Secondary | ICD-10-CM | POA: Insufficient documentation

## 2022-01-08 ENCOUNTER — Other Ambulatory Visit: Payer: Self-pay | Admitting: Cardiovascular Disease

## 2022-01-08 ENCOUNTER — Other Ambulatory Visit: Payer: Self-pay | Admitting: Internal Medicine

## 2022-01-08 NOTE — Telephone Encounter (Signed)
Please schedule office visit for refills.(Advised F/U PRN) If patient does not have any cardiac concerns then she will need to get refills from PCP. Thank you! ?

## 2022-01-10 ENCOUNTER — Other Ambulatory Visit (INDEPENDENT_AMBULATORY_CARE_PROVIDER_SITE_OTHER): Payer: BC Managed Care – PPO

## 2022-01-10 ENCOUNTER — Other Ambulatory Visit: Payer: Self-pay

## 2022-01-10 DIAGNOSIS — E7800 Pure hypercholesterolemia, unspecified: Secondary | ICD-10-CM | POA: Diagnosis not present

## 2022-01-10 DIAGNOSIS — I10 Essential (primary) hypertension: Secondary | ICD-10-CM

## 2022-01-10 DIAGNOSIS — R739 Hyperglycemia, unspecified: Secondary | ICD-10-CM | POA: Diagnosis not present

## 2022-01-10 LAB — LIPID PANEL
Cholesterol: 168 mg/dL (ref 0–200)
HDL: 46.1 mg/dL (ref >39.00)
NonHDL: 121.65
Total CHOL/HDL Ratio: 4
Triglycerides: 209 mg/dL — ABNORMAL HIGH (ref 0.0–149.0)
VLDL: 41.8 mg/dL — ABNORMAL HIGH (ref 0.0–40.0)

## 2022-01-10 LAB — BASIC METABOLIC PANEL
BUN: 18 mg/dL (ref 6–23)
CO2: 32 mEq/L (ref 19–32)
Calcium: 9.6 mg/dL (ref 8.4–10.5)
Chloride: 103 mEq/L (ref 96–112)
Creatinine, Ser: 0.78 mg/dL (ref 0.40–1.20)
GFR: 81.65 mL/min (ref >60.00)
Glucose, Bld: 89 mg/dL (ref 70–99)
Potassium: 4.1 mEq/L (ref 3.5–5.1)
Sodium: 140 mEq/L (ref 135–145)

## 2022-01-10 LAB — HEPATIC FUNCTION PANEL
ALT: 33 U/L (ref 0–35)
AST: 27 U/L (ref 0–37)
Albumin: 4.8 g/dL (ref 3.5–5.2)
Alkaline Phosphatase: 38 U/L — ABNORMAL LOW (ref 39–117)
Bilirubin, Direct: 0.1 mg/dL (ref 0.0–0.3)
Total Bilirubin: 0.6 mg/dL (ref 0.2–1.2)
Total Protein: 7.4 g/dL (ref 6.0–8.3)

## 2022-01-10 LAB — LDL CHOLESTEROL, DIRECT: Direct LDL: 92 mg/dL

## 2022-01-10 LAB — HEMOGLOBIN A1C: Hgb A1c MFr Bld: 6.3 % (ref 4.6–6.5)

## 2022-01-16 ENCOUNTER — Other Ambulatory Visit: Payer: Self-pay | Admitting: Internal Medicine

## 2022-01-22 ENCOUNTER — Encounter: Payer: Self-pay | Admitting: Internal Medicine

## 2022-01-22 ENCOUNTER — Ambulatory Visit: Payer: BC Managed Care – PPO | Admitting: Internal Medicine

## 2022-01-22 VITALS — BP 124/70 | HR 77 | Temp 97.9°F | Resp 16 | Ht 64.0 in | Wt 123.8 lb

## 2022-01-22 DIAGNOSIS — E039 Hypothyroidism, unspecified: Secondary | ICD-10-CM

## 2022-01-22 DIAGNOSIS — I10 Essential (primary) hypertension: Secondary | ICD-10-CM | POA: Diagnosis not present

## 2022-01-22 DIAGNOSIS — E7800 Pure hypercholesterolemia, unspecified: Secondary | ICD-10-CM | POA: Diagnosis not present

## 2022-01-22 DIAGNOSIS — R739 Hyperglycemia, unspecified: Secondary | ICD-10-CM | POA: Diagnosis not present

## 2022-01-22 DIAGNOSIS — Z86010 Personal history of colonic polyps: Secondary | ICD-10-CM

## 2022-01-22 DIAGNOSIS — Z85820 Personal history of malignant melanoma of skin: Secondary | ICD-10-CM

## 2022-01-22 MED ORDER — EZETIMIBE 10 MG PO TABS: 10.0000 mg | ORAL_TABLET | Freq: Every day | ORAL | 3 refills | Status: DC

## 2022-01-22 NOTE — Progress Notes (Signed)
Patient ID: Kathryn Francis, female   DOB: [birth year], 61 y.o.   MRN: [id number] ? ? ?Subjective:  ? ? Patient ID: Kathryn Francis, female    DOB: [birth year], 61 y.o.   MRN: [id number] ? ?This visit occurred during the SARS-CoV-2 public health emergency.  Safety protocols were in place, including screening questions prior to the visit, additional usage of staff PPE, and extensive cleaning of exam room while observing appropriate contact time as indicated for disinfecting solutions.  ? ?Patient here for a scheduled follow up.  ? ?Chief Complaint  ?Patient presents with  ? Hypertension  ? Hyperlipidemia  ? Hypothyroidism  ? .  ? ?HPI ?She is doing well.  Exercising.  Feels good.  No chest pain or sob reported.  No abdominal pain.  Bowels moving.  Recently had back pain.  Was evaluated at [facility].  Back is better now.  No pain.  Discussed labs.  ? ? ?Past Medical History:  ?Diagnosis Date  ? Hypercholesterolemia   ? Hypertension   ? Hypothyroidism   ? Melanoma ([facility])   ? Patella fracture   ? right  ? ?Past Surgical History:  ?Procedure Laterality Date  ? BUNIONECTOMY  1999, 2000  ? ORIF ELBOW FRACTURE Left 06/16/2018  ? Procedure: OPEN REDUCTION INTERNAL FIXATION (ORIF) ELBOW/OLECRANON FRACTURE;  Surgeon: Kennedy Bucker, MD;  Location: [facility] OR[facility];  Service: Orthopedics;  Laterality: Left;  ? ?Family History  ?Problem Relation Age of Onset  ? Hypertension Other   ?     siblings  ? Hypercholesterolemia Other   ?     siblings  ? Heart disease Other   ?     parent  ? Hypertension Other   ?     parent  ? Lupus Other   ?     sister  ? Breast cancer Neg Hx   ? ?Social History  ? ?Socioeconomic History  ? Marital status: Married  ?  Spouse name: Not on file  ? Number of children: 2  ? Years of education: Not on file  ? Highest education level: Not on file  ?Occupational History  ? Not on file  ?Tobacco Use  ? Smoking status: Never  ? Smokeless tobacco: Never  ?Substance and Sexual Activity  ? Alcohol use: No  ?  Alcohol/week: 0.0  standard drinks  ? Drug use: No  ? Sexual activity: Not on file  ?Other Topics Concern  ? Not on file  ?Social History Narrative  ? Not on file  ? ?Social Determinants of Health  ? ?Financial Resource Strain: Not on file  ?Food Insecurity: Not on file  ?Transportation Needs: Not on file  ?Physical Activity: Not on file  ?Stress: Not on file  ?Social Connections: Not on file  ? ? ? ?Review of Systems  ?Constitutional:  Negative for appetite change and unexpected weight change.  ?HENT:  Negative for congestion and sinus pressure.   ?Respiratory:  Negative for cough, chest tightness and shortness of breath.   ?Cardiovascular:  Negative for chest pain, palpitations and leg swelling.  ?Gastrointestinal:  Negative for abdominal pain, diarrhea, nausea and vomiting.  ?Genitourinary:  Negative for difficulty urinating and dysuria.  ?Musculoskeletal:  Negative for joint swelling and myalgias.  ?Skin:  Negative for color change and rash.  ?Neurological:  Negative for dizziness, light-headedness and headaches.  ?Psychiatric/Behavioral:  Negative for agitation and dysphoric mood.   ? ?   ?Objective:  ?  ? ?BP 124/70   Pulse 77  Temp 97.9 ?F (36.6 ?C)   Resp 16   Ht $R'5\' 4"'Ri$  (1.626 m)   Wt 123 lb 12.8 oz (56.2 kg)   SpO2 98%   BMI 21.25 kg/m?  ?Wt Readings from Last 3 Encounters:  ?01/22/22 123 lb 12.8 oz (56.2 kg)  ?11/22/21 128 lb (58.1 kg)  ?07/24/21 122 lb 9.6 oz (55.6 kg)  ? ? ?Physical Exam ?Vitals reviewed.  ?Constitutional:   ?   General: She is not in acute distress. ?   Appearance: Normal appearance.  ?HENT:  ?   Head: Normocephalic and atraumatic.  ?   Right Ear: External ear normal.  ?   Left Ear: External ear normal.  ?Eyes:  ?   General: No scleral icterus.    ?   Right eye: No discharge.     ?   Left eye: No discharge.  ?   Conjunctiva/sclera: Conjunctivae normal.  ?Neck:  ?   Thyroid: No thyromegaly.  ?Cardiovascular:  ?   Rate and Rhythm: Normal rate and regular rhythm.  ?Pulmonary:  ?   Effort: No  respiratory distress.  ?   Breath sounds: Normal breath sounds. No wheezing.  ?Abdominal:  ?   General: Bowel sounds are normal.  ?   Palpations: Abdomen is soft.  ?   Tenderness: There is no abdominal tenderness.  ?Musculoskeletal:     ?   General: No swelling or tenderness.  ?   Cervical back: Neck supple. No tenderness.  ?Lymphadenopathy:  ?   Cervical: No cervical adenopathy.  ?Skin: ?   Findings: No erythema or rash.  ?Neurological:  ?   Mental Status: She is alert.  ?Psychiatric:     ?   Mood and Affect: Mood normal.     ?   Behavior: Behavior normal.  ? ? ? ?Outpatient Encounter Medications as of 01/22/2022  ?Medication Sig  ? alendronate (FOSAMAX) 70 MG tablet Take 70 mg by mouth once a week.  ? atorvastatin (LIPITOR) 10 MG tablet TAKE 1 TABLET BY MOUTH EVERY DAY  ? ezetimibe (ZETIA) 10 MG tablet Take 1 tablet (10 mg total) by mouth daily.  ? hydrochlorothiazide (MICROZIDE) 12.5 MG capsule Take 12.5 mg by mouth daily.  ? levothyroxine (SYNTHROID) 75 MCG tablet TAKE 1 TABLET BY MOUTH EVERY DAY  ? meloxicam (MOBIC) 15 MG tablet TAKE 1 TABLET (15 MG TOTAL) BY MOUTH DAILY.  ? metoprolol tartrate (LOPRESSOR) 25 MG tablet TAKE 1/2 TABLET BY MOUTH EVERY DAY  ? Multiple Vitamin (MULTIVITAMIN) tablet Take 1 tablet by mouth daily.  ? tizanidine (ZANAFLEX) 2 MG capsule TAKE 1 CAPSULE (2 MG TOTAL) BY MOUTH AT BEDTIME AS NEEDED FOR MUSCLE SPASMS.  ? zolpidem (AMBIEN) 5 MG tablet TAKE 1 TABLET BY MOUTH EVERY DAY AT BEDTIME AS NEEDED  ? [DISCONTINUED] ezetimibe (ZETIA) 10 MG tablet Take 1 tablet (10 mg total) by mouth daily.  ? [DISCONTINUED] methylPREDNISolone (MEDROL DOSEPAK) 4 MG TBPK tablet Medrol dose pack - 6 day taper.  Take as directed.  ? ?No facility-administered encounter medications on file as of 01/22/2022.  ?  ? ?Lab Results  ?Component Value Date  ? WBC 5.1 05/15/2021  ? HGB 14.0 05/15/2021  ? HCT 42.2 05/15/2021  ? PLT 255.0 05/15/2021  ? GLUCOSE 89 01/10/2022  ? CHOL 168 01/10/2022  ? TRIG 209.0 (H) 01/10/2022   ? HDL 46.10 01/10/2022  ? LDLDIRECT 92.0 01/10/2022  ? LDLCALC UNABLE TO CALCULATE IF TRIGLYCERIDE OVER 400 mg/dL 11/19/2020  ? ALT 33 01/10/2022  ?  AST 27 01/10/2022  ? NA 140 01/10/2022  ? K 4.1 01/10/2022  ? CL 103 01/10/2022  ? CREATININE 0.78 01/10/2022  ? BUN 18 01/10/2022  ? CO2 32 01/10/2022  ? TSH 2.106 11/19/2020  ? HGBA1C 6.3 01/10/2022  ? ? ?MM 3D SCREEN BREAST BILATERAL ? ?Result Date: 12/27/2021 ?CLINICAL DATA:  Screening. EXAM: DIGITAL SCREENING BILATERAL MAMMOGRAM WITH TOMOSYNTHESIS AND CAD TECHNIQUE: Bilateral screening digital craniocaudal and mediolateral oblique mammograms were obtained. Bilateral screening digital breast tomosynthesis was performed. The images were evaluated with computer-aided detection. COMPARISON:  Previous exam(s). ACR Breast Density Category b: There are scattered areas of fibroglandular density. FINDINGS: There are no findings suspicious for malignancy. IMPRESSION: No mammographic evidence of malignancy. A result letter of this screening mammogram will be mailed directly to the patient. RECOMMENDATION: Screening mammogram in one year. (Code:SM-B-01Y) BI-RADS CATEGORY  1: Negative. Electronically Signed   By: Kristopher Oppenheim M.D.   On: 12/27/2021 09:38  ? ? ?   ?Assessment & Plan:  ? ?Problem List Items Addressed This Visit   ? ? History of colonic polyps  ?  Colonoscopy 06/2013.  Recommended f/u in 10 years.  Returned hemoccult cards today.   ?  ?  ? History of melanoma  ?  Followed by dermatology ?  ?  ? Hypercholesterolemia  ?  She has adjusted her diet.  Has cut out sugars.  Feels good.  Triglycerides improved - 209 on recent check.  Follow lipid panel and liver function tests.  Continue lipitor and zetia.  ?  ?  ? Relevant Medications  ? ezetimibe (ZETIA) 10 MG tablet  ? Other Relevant Orders  ? Hepatic function panel  ? Lipid panel  ? Hyperglycemia  ?  Watches her diet.  Has lost weight.  Exercises regularly.  Follow met b and a1c.   ?Lab Results  ?Component Value Date   ? HGBA1C 6.3 01/10/2022  ?  ?  ? Relevant Orders  ? Hemoglobin A1c  ? Hypertension - Primary  ?  Continue hctz and metoprolol.  Blood pressure recheck improved.  Follow pressures.  Follow metabolic panel.  ?  ?  ? Rele

## 2022-01-22 NOTE — Assessment & Plan Note (Signed)
She has adjusted her diet.  Has cut out sugars.  Feels good.  Triglycerides improved - 209 on recent check.  Follow lipid panel and liver function tests.  Continue lipitor and zetia.  ?

## 2022-01-22 NOTE — Assessment & Plan Note (Signed)
Colonoscopy 06/2013.  Recommended f/u in 10 years.  Returned hemoccult cards today.   

## 2022-01-22 NOTE — Assessment & Plan Note (Signed)
Followed by dermatology

## 2022-01-22 NOTE — Assessment & Plan Note (Signed)
Watches her diet.  Has lost weight.  Exercises regularly.  Follow met b and a1c.   ?Lab Results  ?Component Value Date  ? HGBA1C 6.3 01/10/2022  ? ?

## 2022-01-22 NOTE — Assessment & Plan Note (Signed)
Continue hctz and metoprolol.  Blood pressure recheck improved.  Follow pressures.  Follow metabolic panel.  ?

## 2022-01-22 NOTE — Assessment & Plan Note (Signed)
On thyroid replacement.  Follow tsh.  

## 2022-01-31 NOTE — Telephone Encounter (Signed)
Attempted to schedule.  

## 2022-02-26 ENCOUNTER — Other Ambulatory Visit: Payer: Self-pay | Admitting: Internal Medicine

## 2022-03-24 NOTE — Progress Notes (Unsigned)
Cardiology Office Note  Date:  03/25/2022   ID:  DEION FORGUE, DOB [birth year], MRN [id number]  PCP:  Einar Pheasant, MD   Chief Complaint  Patient presents with   12 month follow up     "Doing well." Medications reviewed by the patient verbally.     HPI:  Ms. Kathryn Francis is a 61 year old woman with past medical history of COVID in 2020 Hyperlipidemia shortness of breath April 2021 Family history coronary disease Who presents for follow-up of her abnormal stress test, shortness of breath  Last seen in clinic by myself March 2022 In follow-up today reports she feels well Feels that prior symptoms of shortness of breath have resolved, etiology unclear  Weight down 11 pounds Exercising on a regular basis, working part-time Does not have blood pressure cuff, low blood pressure today Denies orthostasis symptoms  On HCTZ for tinnitus Taking metoprolol tartrate 12.5 mg in the evening  EKG personally reviewed by myself on todays visit Normal sinus rhythm with rate 71 bpm rare PVC no significant ST-T wave changes  Other past medical history reviewed Developed shortness of breath early 2021,  symptoms may have started after COVID Has had echocardiogram, CT scan as below, cardiopulmonary exercise study  Echo November 2021  1. Left ventricular ejection fraction, by estimation, is 60 to 65%. The  left ventricle has normal function. The left ventricle has no regional  wall motion abnormalities. Left ventricular diastolic parameters are  consistent with Grade I diastolic  dysfunction (impaired relaxation).   2. Right ventricular systolic function is normal. The right ventricular  size is normal. There is normal pulmonary artery systolic pressure. The  estimated right ventricular systolic pressure is 93.8 mmHg.   3. The mitral valve is normal in structure. Mild mitral valve  regurgitation.   CT scan September 2021 Images reviewed, pulled up in the office, no significant  coronary calcification or aortic atherosclerosis Negative CT chest. Specifically, no findings to suggest post COVID fibrosis.  Cardiopulmonary exercise test December 2021 Conclusion: Exercise testing with gas exchange demonstrates excellent functional capacity when compared to matched sedentary norms. With elevated VE/VCO2 slope and absence of desaturations, patient appears primarily ventilatory limited as she is achieving her physiological limits and appears to be hyperventilating at peak exercise. Given the VE/VCO2 slope is elevated and there is mild up-sloing ST-depression, cardiology evaluation could be considered.  Exercise Time:    8:45   Speed (mph): 7.0       Grade (%): 8.0   RPE: 17  Reason stopped: leg fatigue and dyspnea (9/10)  Resting HR: 88 Standing HR: 90 Peak HR: 175   (109% age predicted max HR)  BP rest: 124/82 Standing BP: 110/78 BP peak: 168/64    PMH:   has a past medical history of Hypercholesterolemia, Hypertension, Hypothyroidism, Melanoma ([place]), and Patella fracture.  PSH:    Past Surgical History:  Procedure Laterality Date   BUNIONECTOMY  1999, 2000   ORIF ELBOW FRACTURE Left 06/16/2018   Procedure: OPEN REDUCTION INTERNAL FIXATION (ORIF) ELBOW/OLECRANON FRACTURE;  Surgeon: Hessie Knows, MD;  Location: [facility] ORS;  Service: Orthopedics;  Laterality: Left;    Current Outpatient Medications  Medication Sig Dispense Refill   alendronate (FOSAMAX) 70 MG tablet Take 70 mg by mouth once a week.  11   atorvastatin (LIPITOR) 10 MG tablet TAKE 1 TABLET BY MOUTH EVERY DAY 90 tablet 2   ezetimibe (ZETIA) 10 MG tablet Take 1 tablet (10 mg total) by mouth daily. 90 tablet 3  levothyroxine (SYNTHROID) 75 MCG tablet TAKE 1 TABLET BY MOUTH EVERY DAY 90 tablet 3   metoprolol tartrate (LOPRESSOR) 25 MG tablet TAKE 1/2 TABLET BY MOUTH EVERY DAY 45 tablet 1   Multiple Vitamin (MULTIVITAMIN) tablet Take 1 tablet by mouth daily.     zolpidem (AMBIEN) 5 MG tablet TAKE 1 TABLET BY  MOUTH EVERY DAY AT BEDTIME AS NEEDED 30 tablet 0   hydrochlorothiazide (MICROZIDE) 12.5 MG capsule Take 12.5 mg by mouth daily. (Patient not taking: Reported on 03/25/2022)     meloxicam (MOBIC) 15 MG tablet TAKE 1 TABLET (15 MG TOTAL) BY MOUTH DAILY. (Patient not taking: Reported on 03/25/2022) 15 tablet 0   tizanidine (ZANAFLEX) 2 MG capsule TAKE 1 CAPSULE (2 MG TOTAL) BY MOUTH AT BEDTIME AS NEEDED FOR MUSCLE SPASMS. (Patient not taking: Reported on 03/25/2022) 15 capsule 0   No current facility-administered medications for this visit.     Allergies:   Sulfa antibiotics   Social History:  The patient  reports that she has never smoked. She has never used smokeless tobacco. She reports that she does not drink alcohol and does not use drugs.   Family History:   family history includes Heart disease in an other family member; Hypercholesterolemia in an other family member; Hypertension in some other family members; Lupus in an other family member.    Review of Systems: Review of Systems  Constitutional: Negative.   HENT: Negative.    Respiratory:  Positive for shortness of breath.   Cardiovascular: Negative.   Gastrointestinal: Negative.   Musculoskeletal: Negative.   Neurological: Negative.   Psychiatric/Behavioral: Negative.    All other systems reviewed and are negative.  PHYSICAL EXAM: VS:  BP 100/74 (BP Location: Left Arm, Patient Position: Sitting, Cuff Size: Normal)   Pulse 71   Ht '5\' 4"'$  (1.626 m)   Wt 123 lb (55.8 kg)   SpO2 98%   BMI 21.11 kg/m  , BMI Body mass index is 21.11 kg/m. Constitutional:  oriented to person, place, and time. No distress.  HENT:  Head: Grossly normal Eyes:  no discharge. No scleral icterus.  Neck: No JVD, no carotid bruits  Cardiovascular: Regular rate and rhythm, no murmurs appreciated Pulmonary/Chest: Clear to auscultation bilaterally, no wheezes or rails Abdominal: Soft.  no distension.  no tenderness.  Musculoskeletal: Normal range of  motion Neurological:  normal muscle tone. Coordination normal. No atrophy Skin: Skin warm and dry Psychiatric: normal affect, pleasant   Recent Labs: 05/15/2021: Hemoglobin 14.0; Platelets 255.0 01/10/2022: ALT 33; BUN 18; Creatinine, Ser 0.78; Potassium 4.1; Sodium 140    Lipid Panel Lab Results  Component Value Date   CHOL 168 01/10/2022   HDL 46.10 01/10/2022   LDLCALC UNABLE TO CALCULATE IF TRIGLYCERIDE OVER 400 mg/dL 11/19/2020   TRIG 209.0 (H) 01/10/2022      Wt Readings from Last 3 Encounters:  03/25/22 123 lb (55.8 kg)  01/22/22 123 lb 12.8 oz (56.2 kg)  11/22/21 128 lb (58.1 kg)     ASSESSMENT AND PLAN:  Problem List Items Addressed This Visit       Cardiology Problems   Hypertension - Primary   Relevant Orders   EKG 12-Lead   Hypercholesterolemia   Relevant Orders   EKG 12-Lead     Other   SOB (shortness of breath)   Relevant Orders   EKG 12-Lead  Shortness of breath Presumed to be secondary to post COVID complications 2 years ago Symptoms have since resolved Good exercise tolerance No  further testing needed  Hyperlipidemia Strong family history coronary disease Recent weight loss 11 pounds, cholesterol at goal tolerating Zetia and low-dose Lipitor  Hypertension Blood pressure low on today's visit, does not have blood pressure cuff at home Recent 11 pound weight loss Feels she needs to stay on HCTZ for history of tinnitus May be able to stop the metoprolol tartrate She will start monitoring blood pressure    Total encounter time more than 30 minutes  Greater than 50% was spent in counseling and coordination of care with the patient   Signed, Esmond Plants, M.D., Ph.D. [place], [place]

## 2022-03-25 ENCOUNTER — Ambulatory Visit: Payer: BC Managed Care – PPO | Admitting: Cardiovascular Disease

## 2022-03-25 ENCOUNTER — Encounter: Payer: Self-pay | Admitting: Cardiovascular Disease

## 2022-03-25 VITALS — BP 100/74 | HR 71 | Ht 64.0 in | Wt 123.0 lb

## 2022-03-25 DIAGNOSIS — R0602 Shortness of breath: Secondary | ICD-10-CM | POA: Diagnosis not present

## 2022-03-25 DIAGNOSIS — E7800 Pure hypercholesterolemia, unspecified: Secondary | ICD-10-CM

## 2022-03-25 DIAGNOSIS — I10 Essential (primary) hypertension: Secondary | ICD-10-CM | POA: Diagnosis not present

## 2022-03-25 NOTE — Patient Instructions (Signed)
Medication Instructions:  No changes  If you need a refill on your cardiac medications before your next appointment, please call your pharmacy.   Lab work: No new labs needed  Testing/Procedures: No new testing needed  Follow-Up: At [facility], you and your health needs are our priority.  As part of our continuing mission to provide you with exceptional heart care, we have created designated Provider Care Teams.  These Care Teams include your primary Cardiologist (physician) and Advanced Practice Providers (APPs -  Physician Assistants and Nurse Practitioners) who all work together to provide you with the care you need, when you need it.  You will need a follow up appointment as needed  Providers on your designated Care Team:   Christopher Berge, NP Ryan Dunn, PA-C Cadence Furth, PA-C  COVID-19 Vaccine Information can be found at: [email or web address] For questions related to vaccine distribution or appointments, please email [email or web address] or call [phone].    

## 2022-05-15 ENCOUNTER — Other Ambulatory Visit: Payer: Self-pay | Admitting: Internal Medicine

## 2022-06-13 ENCOUNTER — Other Ambulatory Visit (INDEPENDENT_AMBULATORY_CARE_PROVIDER_SITE_OTHER): Payer: BC Managed Care – PPO

## 2022-06-13 DIAGNOSIS — E7800 Pure hypercholesterolemia, unspecified: Secondary | ICD-10-CM | POA: Diagnosis not present

## 2022-06-13 DIAGNOSIS — R739 Hyperglycemia, unspecified: Secondary | ICD-10-CM

## 2022-06-13 DIAGNOSIS — I10 Essential (primary) hypertension: Secondary | ICD-10-CM | POA: Diagnosis not present

## 2022-06-13 LAB — HEPATIC FUNCTION PANEL
ALT: 28 U/L (ref 0–35)
AST: 25 U/L (ref 0–37)
Albumin: 4.4 g/dL (ref 3.5–5.2)
Alkaline Phosphatase: 46 U/L (ref 39–117)
Bilirubin, Direct: 0.1 mg/dL (ref 0.0–0.3)
Total Bilirubin: 0.6 mg/dL (ref 0.2–1.2)
Total Protein: 7.2 g/dL (ref 6.0–8.3)

## 2022-06-13 LAB — CBC WITH DIFFERENTIAL/PLATELET
Basophils Absolute: 0.1 10*3/uL (ref 0.0–0.1)
Basophils Relative: 1.1 % (ref 0.0–3.0)
Eosinophils Absolute: 0.2 10*3/uL (ref 0.0–0.7)
Eosinophils Relative: 3.2 % (ref 0.0–5.0)
HCT: 40.1 % (ref 36.0–46.0)
Hemoglobin: 13.6 g/dL (ref 12.0–15.0)
Lymphocytes Relative: 37.2 % (ref 12.0–46.0)
Lymphs Abs: 1.9 10*3/uL (ref 0.7–4.0)
MCHC: 34 g/dL (ref 30.0–36.0)
MCV: 84.6 fl (ref 78.0–100.0)
Monocytes Absolute: 0.5 10*3/uL (ref 0.1–1.0)
Monocytes Relative: 9.9 % (ref 3.0–12.0)
Neutro Abs: 2.4 10*3/uL (ref 1.4–7.7)
Neutrophils Relative %: 48.6 % (ref 43.0–77.0)
Platelets: 249 10*3/uL (ref 150.0–400.0)
RBC: 4.74 Mil/uL (ref 3.87–5.11)
RDW: 13.1 % (ref 11.5–15.5)
WBC: 5 10*3/uL (ref 4.0–10.5)

## 2022-06-13 LAB — BASIC METABOLIC PANEL
BUN: 18 mg/dL (ref 6–23)
CO2: 29 mEq/L (ref 19–32)
Calcium: 9.5 mg/dL (ref 8.4–10.5)
Chloride: 103 mEq/L (ref 96–112)
Creatinine, Ser: 0.91 mg/dL (ref 0.40–1.20)
GFR: 67.66 mL/min (ref >60.00)
Glucose, Bld: 91 mg/dL (ref 70–99)
Potassium: 4.6 mEq/L (ref 3.5–5.1)
Sodium: 139 mEq/L (ref 135–145)

## 2022-06-13 LAB — LIPID PANEL
Cholesterol: 186 mg/dL (ref 0–200)
HDL: 48.9 mg/dL (ref >39.00)
NonHDL: 137.51
Total CHOL/HDL Ratio: 4
Triglycerides: 264 mg/dL — ABNORMAL HIGH (ref 0.0–149.0)
VLDL: 52.8 mg/dL — ABNORMAL HIGH (ref 0.0–40.0)

## 2022-06-13 LAB — LDL CHOLESTEROL, DIRECT: Direct LDL: 99 mg/dL

## 2022-06-13 LAB — TSH: TSH: 1.84 u[IU]/mL (ref 0.35–5.50)

## 2022-06-13 LAB — HEMOGLOBIN A1C: Hgb A1c MFr Bld: 6.2 % (ref 4.6–6.5)

## 2022-06-18 ENCOUNTER — Encounter: Payer: Self-pay | Admitting: Internal Medicine

## 2022-06-18 ENCOUNTER — Ambulatory Visit (INDEPENDENT_AMBULATORY_CARE_PROVIDER_SITE_OTHER): Payer: BC Managed Care – PPO | Admitting: Internal Medicine

## 2022-06-18 VITALS — BP 120/70 | HR 80 | Temp 97.4°F | Resp 12 | Ht 64.0 in | Wt 124.0 lb

## 2022-06-18 DIAGNOSIS — E7800 Pure hypercholesterolemia, unspecified: Secondary | ICD-10-CM

## 2022-06-18 DIAGNOSIS — I10 Essential (primary) hypertension: Secondary | ICD-10-CM

## 2022-06-18 DIAGNOSIS — Z85820 Personal history of malignant melanoma of skin: Secondary | ICD-10-CM

## 2022-06-18 DIAGNOSIS — E039 Hypothyroidism, unspecified: Secondary | ICD-10-CM | POA: Diagnosis not present

## 2022-06-18 DIAGNOSIS — Z860100 Personal history of colon polyps, unspecified: Secondary | ICD-10-CM

## 2022-06-18 DIAGNOSIS — R739 Hyperglycemia, unspecified: Secondary | ICD-10-CM | POA: Diagnosis not present

## 2022-06-18 DIAGNOSIS — Z136 Encounter for screening for cardiovascular disorders: Secondary | ICD-10-CM

## 2022-06-18 DIAGNOSIS — R0602 Shortness of breath: Secondary | ICD-10-CM

## 2022-06-18 NOTE — Assessment & Plan Note (Signed)
On metoprolol.  Per note, off hctz.

## 2022-06-18 NOTE — Assessment & Plan Note (Signed)
She has adjusted her diet.  Has cut out sugars.  Feels good.  Follow lipid panel and liver function tests.  Continue lipitor and zetia.

## 2022-06-18 NOTE — Progress Notes (Addendum)
Patient ID: Kathryn Francis, female   DOB: 04/13/1961, 61 y.o.   MRN: [id number]   Subjective:    Patient ID: Kathryn Francis, female    DOB: 1961-02-09, 61 y.o.   MRN: [id number]   Patient here for  Chief Complaint  Patient presents with   Follow-up    4 mon, denies any concerns or pain    .   HPI Here to follow up regarding her cholesterol and blood pressure.  Saw Dr Rockey Situ 03/25/22 - f/u sob - per note, presumed to be secondary to post covid complications.  Symptoms resolved.  Good exercise tolerance.  Feels good.  Has family history of CAD/elevated cholesterol.  Discussed calcium score.  Eating.  No nausea or vomiting.  Exercises regularly.     Past Medical History:  Diagnosis Date   Hypercholesterolemia    Hypertension    Hypothyroidism    Melanoma ([place])    Patella fracture    right   Past Surgical History:  Procedure Laterality Date   BUNIONECTOMY  1999, 2000   ORIF ELBOW FRACTURE Left 06/16/2018   Procedure: OPEN REDUCTION INTERNAL FIXATION (ORIF) ELBOW/OLECRANON FRACTURE;  Surgeon: Hessie Knows, MD;  Location: [facility] ORS;  Service: Orthopedics;  Laterality: Left;   Family History  Problem Relation Age of Onset   Hypertension Other        siblings   Hypercholesterolemia Other        siblings   Heart disease Other        parent   Hypertension Other        parent   Lupus Other        sister   Breast cancer Neg Hx    Social History   Socioeconomic History   Marital status: Married    Spouse name: Not on file   Number of children: 2   Years of education: Not on file   Highest education level: Not on file  Occupational History   Not on file  Tobacco Use   Smoking status: Never   Smokeless tobacco: Never  Substance and Sexual Activity   Alcohol use: No    Alcohol/week: 0.0 standard drinks of alcohol   Drug use: No   Sexual activity: Not on file  Other Topics Concern   Not on file  Social History Narrative   Not on file   Social Determinants of Health    Financial Resource Strain: Not on file  Food Insecurity: Not on file  Transportation Needs: Not on file  Physical Activity: Not on file  Stress: Not on file  Social Connections: Not on file     Review of Systems  Constitutional:  Negative for appetite change and unexpected weight change.  HENT:  Negative for congestion and sinus pressure.   Respiratory:  Negative for cough, chest tightness and shortness of breath.   Cardiovascular:  Negative for chest pain, palpitations and leg swelling.  Gastrointestinal:  Negative for abdominal pain, diarrhea, nausea and vomiting.  Genitourinary:  Negative for difficulty urinating and dysuria.  Musculoskeletal:  Negative for joint swelling and myalgias.  Skin:  Negative for color change and rash.  Neurological:  Negative for dizziness, light-headedness and headaches.  Psychiatric/Behavioral:  Negative for agitation and dysphoric mood.        Objective:     BP 120/70 (BP Location: Left Arm, Patient Position: Sitting, Cuff Size: Normal)   Pulse 80   Temp (!) 97.4 F (36.3 C) (Oral)   Resp 12   Ht  5\' 4"  (1.626 m)   Wt 124 lb (56.2 kg)   SpO2 95%   BMI 21.28 kg/m  Wt Readings from Last 3 Encounters:  06/18/22 124 lb (56.2 kg)  03/25/22 123 lb (55.8 kg)  01/22/22 123 lb 12.8 oz (56.2 kg)    Physical Exam Vitals reviewed.  Constitutional:      General: She is not in acute distress.    Appearance: Normal appearance.  HENT:     Head: Normocephalic and atraumatic.     Right Ear: External ear normal.     Left Ear: External ear normal.  Eyes:     General: No scleral icterus.       Right eye: No discharge.        Left eye: No discharge.     Conjunctiva/sclera: Conjunctivae normal.  Neck:     Thyroid: No thyromegaly.  Cardiovascular:     Rate and Rhythm: Normal rate and regular rhythm.  Pulmonary:     Effort: No respiratory distress.     Breath sounds: Normal breath sounds. No wheezing.  Abdominal:     General: Bowel sounds  are normal.     Palpations: Abdomen is soft.     Tenderness: There is no abdominal tenderness.  Musculoskeletal:        General: No swelling or tenderness.     Cervical back: Neck supple. No tenderness.  Lymphadenopathy:     Cervical: No cervical adenopathy.  Skin:    Findings: No erythema or rash.  Neurological:     Mental Status: She is alert.  Psychiatric:        Mood and Affect: Mood normal.        Behavior: Behavior normal.      Outpatient Encounter Medications as of 06/18/2022  Medication Sig   alendronate (FOSAMAX) 70 MG tablet Take 70 mg by mouth once a week.   atorvastatin (LIPITOR) 10 MG tablet TAKE 1 TABLET BY MOUTH EVERY DAY   ezetimibe (ZETIA) 10 MG tablet Take 1 tablet (10 mg total) by mouth daily.   levothyroxine (SYNTHROID) 75 MCG tablet TAKE 1 TABLET BY MOUTH EVERY DAY   metoprolol tartrate (LOPRESSOR) 25 MG tablet TAKE 1/2 TABLET BY MOUTH EVERY DAY   Multiple Vitamin (MULTIVITAMIN) tablet Take 1 tablet by mouth daily.   zolpidem (AMBIEN) 5 MG tablet TAKE 1 TABLET BY MOUTH EVERY DAY AT BEDTIME AS NEEDED   [DISCONTINUED] hydrochlorothiazide (MICROZIDE) 12.5 MG capsule Take 12.5 mg by mouth daily. (Patient not taking: Reported on 03/25/2022)   [DISCONTINUED] meloxicam (MOBIC) 15 MG tablet TAKE 1 TABLET (15 MG TOTAL) BY MOUTH DAILY. (Patient not taking: Reported on 03/25/2022)   [DISCONTINUED] tizanidine (ZANAFLEX) 2 MG capsule TAKE 1 CAPSULE (2 MG TOTAL) BY MOUTH AT BEDTIME AS NEEDED FOR MUSCLE SPASMS. (Patient not taking: Reported on 03/25/2022)   No facility-administered encounter medications on file as of 06/18/2022.     Lab Results  Component Value Date   WBC 5.0 06/13/2022   HGB 13.6 06/13/2022   HCT 40.1 06/13/2022   PLT 249.0 06/13/2022   GLUCOSE 91 06/13/2022   CHOL 186 06/13/2022   TRIG 264.0 (H) 06/13/2022   HDL 48.90 06/13/2022   LDLDIRECT 99.0 06/13/2022   LDLCALC UNABLE TO CALCULATE IF TRIGLYCERIDE OVER 400 mg/dL 06/15/2022   ALT 28 13/39/7885   AST  25 06/13/2022   NA 139 06/13/2022   K 4.6 06/13/2022   CL 103 06/13/2022   CREATININE 0.91 06/13/2022   BUN 18 06/13/2022  CO2 29 06/13/2022   TSH 1.84 06/13/2022   HGBA1C 6.2 06/13/2022    MM 3D SCREEN BREAST BILATERAL  Result Date: 12/27/2021 CLINICAL DATA:  Screening. EXAM: DIGITAL SCREENING BILATERAL MAMMOGRAM WITH TOMOSYNTHESIS AND CAD TECHNIQUE: Bilateral screening digital craniocaudal and mediolateral oblique mammograms were obtained. Bilateral screening digital breast tomosynthesis was performed. The images were evaluated with computer-aided detection. COMPARISON:  Previous exam(s). ACR Breast Density Category b: There are scattered areas of fibroglandular density. FINDINGS: There are no findings suspicious for malignancy. IMPRESSION: No mammographic evidence of malignancy. A result letter of this screening mammogram will be mailed directly to the patient. RECOMMENDATION: Screening mammogram in one year. (Code:SM-B-01Y) BI-RADS CATEGORY  1: Negative. Electronically Signed   By: Kristopher Oppenheim M.D.   On: 12/27/2021 09:38       Assessment & Plan:   Problem List Items Addressed This Visit     History of colonic polyps    Colonoscopy 06/2013.  Recommended f/u in 10 years.  Returned hemoccult cards today.        History of melanoma    Followed by dermatology.       Hypercholesterolemia    She has adjusted her diet.  Has cut out sugars.  Feels good.  Follow lipid panel and liver function tests.  Continue lipitor and zetia.       Relevant Orders   Hepatic function panel   Lipid panel   Hyperglycemia    Watches her diet.  Has lost weight.  Exercises regularly.  Follow met b and a1c.   Lab Results  Component Value Date   HGBA1C 6.2 06/13/2022       Relevant Orders   Hemoglobin A1c   Hypertension    On metoprolol.  Per note, off hctz.        Relevant Orders   Basic metabolic panel   Hypothyroidism - Primary    On thyroid replacement.  Follow tsh.       SOB  (shortness of breath)    Saw cardiology.  Felt to be post covid.  Resolved now.  Good exercise tolerance.        Other Visit Diagnoses     Screening for heart disease       Relevant Orders   CT CARDIAC SCORING (Completed)        Einar Pheasant, MD

## 2022-06-18 NOTE — Assessment & Plan Note (Signed)
Colonoscopy 06/2013.  Recommended f/u in 10 years.  Returned hemoccult cards today.

## 2022-06-18 NOTE — Assessment & Plan Note (Signed)
On thyroid replacement.  Follow tsh.  

## 2022-06-18 NOTE — Assessment & Plan Note (Signed)
Watches her diet.  Has lost weight.  Exercises regularly.  Follow met b and a1c.   Lab Results  Component Value Date   HGBA1C 6.2 06/13/2022

## 2022-06-24 ENCOUNTER — Ambulatory Visit
Admission: RE | Admit: 2022-06-24 | Discharge: 2022-06-24 | Disposition: A | Discharge status: Home / Self Care | Payer: BC Managed Care – PPO | Source: Ambulatory Visit | Attending: Internal Medicine | Admitting: Internal Medicine

## 2022-06-24 DIAGNOSIS — Z136 Encounter for screening for cardiovascular disorders: Secondary | ICD-10-CM | POA: Insufficient documentation

## 2022-06-29 ENCOUNTER — Encounter: Payer: Self-pay | Admitting: Internal Medicine

## 2022-06-29 NOTE — Assessment & Plan Note (Signed)
Followed by dermatology

## 2022-06-29 NOTE — Assessment & Plan Note (Signed)
Saw cardiology.  Felt to be post covid.  Resolved now.  Good exercise tolerance.

## 2022-07-01 ENCOUNTER — Other Ambulatory Visit: Payer: Self-pay | Admitting: Internal Medicine

## 2022-07-02 NOTE — Telephone Encounter (Signed)
Refilled: 01/16/2022 Last OV: 06/18/2022 Next OV: 10/29/2022

## 2022-08-16 ENCOUNTER — Other Ambulatory Visit: Payer: Self-pay | Admitting: Internal Medicine

## 2022-08-25 ENCOUNTER — Encounter: Payer: Self-pay | Admitting: Internal Medicine

## 2022-08-27 ENCOUNTER — Telehealth: Payer: Self-pay

## 2022-08-27 ENCOUNTER — Encounter: Payer: Self-pay | Admitting: Family Medicine

## 2022-08-27 ENCOUNTER — Telehealth: Payer: BC Managed Care – PPO | Admitting: Family Medicine

## 2022-08-27 VITALS — Wt 124.0 lb

## 2022-08-27 DIAGNOSIS — J040 Acute laryngitis: Secondary | ICD-10-CM | POA: Diagnosis not present

## 2022-08-27 MED ORDER — PREDNISONE 20 MG PO TABS: 20.0000 mg | ORAL_TABLET | Freq: Every day | ORAL | 0 refills | Status: AC

## 2022-08-27 MED ORDER — FAMOTIDINE 20 MG PO TABS: 20.0000 mg | ORAL_TABLET | Freq: Two times a day (BID) | ORAL | 0 refills | Status: DC

## 2022-08-27 NOTE — Patient Instructions (Signed)
It was a pleasure meeting you today. Thank you for allowing me to take part in your health care.  Our goals for today as we discussed include:  For your acute laryngitis Start Prednisone 20 mg daily for 5 days Start Pepcid 20 mg twice a day Drink plenty of fluids If you have any swelling of your neck, tongue or throat call 911 or have someone bring you to the Emergency room   Please follow-up with PCP in 1 week or sooner if symptoms worsen  If you have any questions or concerns, please do not hesitate to call the office at [phone].  I look forward to our next visit and until then take care and stay safe.  Regards,   Carollee Leitz, MD   [facility]    Laryngitis  Laryngitis is irritation and swelling (inflammation) of your vocal cords. It causes your voice to sound hoarse and may cause you to lose your voice. Depending on the cause, this condition may go away after a short time or may last for more than 3 weeks. Treatment often involves resting your voice and using medicines to soothe your throat. What are the causes? Laryngitis that lasts for a short time may be caused by: An infection caused by a virus. Lots of talking, yelling, or singing. This is also called vocal strain. An infection caused by bacteria. Laryngitis that lasts for more than 3 weeks can be caused by: Lots of talking, yelling, or singing. An injury to the vocal cords. Acid reflux. Allergies. Sinus infection. Mucus draining from the nose down the throat (postnasal drip). Smoking. Drinking too much alcohol. Breathing in chemicals or dust. Having growths on the vocal cords. What increases the risk? Smoking. Drinking too much alcohol. Having allergies. Breathing in fumes at work. What are the signs or symptoms? A change in your voice. It may sound low and hoarse. Loss of voice. Dry cough. Sore throat. Dry throat. Stuffy nose. How is this treated? Treatment depends on what is  causing the laryngitis. Usually, treatment includes: Resting your voice. Using medicines to soothe your throat. If your laryngitis is caused by an infection from bacteria, you may need to take antibiotics. If your laryngitis is caused by a growth on your vocal cords, you may need to have a surgery to remove it. Follow these instructions at home: Medicines Take over-the-counter and prescription medicines only as told by your doctor. If you were prescribed an antibiotic medicine, take it as told by your doctor. Do not stop taking it even if you start to feel better. Use throat lozenges or sprays to soothe your throat as told by your doctor. General instructions  Talk as little as possible. To do this: Avoid whispering. Write instead of talking. Do this until your voice is back to normal. Rinse your mouth (gargle) with a salt water mixture 3-4 times a day or as needed. To make salt water, dissolve -1 tsp (3-6 g) of salt in 1 cup (237 mL) of warm water. Do not swallow this mixture. Drink enough fluid to keep your pee (urine) pale yellow. Breathe in moist air. Use a humidifier if you live in a dry climate. Do not smoke or use any products that contain nicotine or tobacco. If you need help quitting, ask your doctor. Contact a doctor if: You have a fever. Your pain is worse. Your symptoms do not get better in 2 weeks. Get help right away if: You cough up blood. You have trouble swallowing.  You have trouble breathing. Summary Laryngitis is inflammation of your vocal cords. This condition causes your voice to sound low and hoarse. Rest your voice by talking as little as possible. Also avoid whispering. Get help right away if you have trouble swallowing or breathing or if you cough up blood. This information is not intended to replace advice given to you by your health care provider. Make sure you discuss any questions you have with your health care provider. Document Revised: 12/24/2020  Document Reviewed: 12/24/2020 Elsevier Patient Education  [place].

## 2022-08-27 NOTE — Progress Notes (Unsigned)
[facility] [facility] Telemedicine Visit  Patient consented to have virtual visit and was identified by name and date of birth. Method of visit: Video  Encounter participants: Patient: Kathryn Francis - located at work Provider: Carollee Leitz - located at office Others (if applicable): none  Chief Complaint: lost voice  HPI:  Patient reports that she was at a football game 6 days ago and woke up next day with no voice.  Denies any screaming out, fevers, congestion, shortness of breath, sore throat, pain.  Reports feels fine.  Endorses hoarseness has worsened over the last 3 days.  Has a dry cough at night.  Home COVID test negative.  Husband was sick 3 weeks ago with similar symptoms but since has resolved.  Has tried cough and cold OTC medications that does not seem to help.  ROS: per HPI  Pertinent PMHx:  Hypothyroidism Osteoporosis on Fosamax  Exam:  Wt 124 lb (56.2 kg)   BMI 21.28 kg/m   Respiratory: Speaks in full sentences.  Voice hoarse.  No increase work in breathing.  Assessment/Plan:  Acute laryngitis Suspect viral etiology given husband had similar symptoms and not resolving.  Also no other symptoms other than hoarseness of throat.  Exam limited due to nature of visit.  In no acute respiratory distress.  Nighttime cough possible related to silent reflux given no other respiratory symptoms. -Trial of famotidine 20 mg twice daily -Prednisone 20 mg daily x 5 days.  Patient instructed to stop a couple of days if no improvement with Pepcid. -Follow-up with PCP as scheduled or sooner if symptoms continue to worsen.

## 2022-08-27 NOTE — Telephone Encounter (Signed)
LM for Patient to let her know that I sent her a link to get on the video visit at 11:40 with Dr. Volanda Napoleon and to ask her to get on a few minutes early in case she has trouble then she can call us and we can troubleshoot the issue together.

## 2022-09-10 ENCOUNTER — Encounter: Payer: Self-pay | Admitting: Family Medicine

## 2022-09-10 DIAGNOSIS — J040 Acute laryngitis: Secondary | ICD-10-CM | POA: Insufficient documentation

## 2022-09-10 NOTE — Assessment & Plan Note (Addendum)
Suspect viral etiology given husband had similar symptoms and not resolving.  Also no other symptoms other than hoarseness of throat.  Exam limited due to nature of visit.  In no acute respiratory distress.  Nighttime cough possible related to silent reflux given no other respiratory symptoms. -Trial of famotidine 20 mg twice daily -Prednisone 20 mg daily x 5 days.  Patient instructed to stop a couple of days if no improvement with Pepcid. -Follow-up with PCP as scheduled or sooner if symptoms continue to worsen.

## 2022-10-14 ENCOUNTER — Ambulatory Visit
Admission: RE | Admit: 2022-10-14 | Discharge: 2022-10-14 | Disposition: A | Discharge status: Home / Self Care | Payer: BC Managed Care – PPO | Source: Ambulatory Visit | Attending: Emergency Medicine | Admitting: Emergency Medicine

## 2022-10-14 VITALS — BP 147/89 | HR 95 | Temp 98.5°F | Resp 16 | Ht 64.0 in | Wt 125.0 lb

## 2022-10-14 DIAGNOSIS — Z1152 Encounter for screening for COVID-19: Secondary | ICD-10-CM | POA: Insufficient documentation

## 2022-10-14 DIAGNOSIS — B349 Viral infection, unspecified: Secondary | ICD-10-CM | POA: Diagnosis not present

## 2022-10-14 NOTE — ED Provider Notes (Signed)
[facility]    CSN: [id number] Arrival date & time: 10/14/22  1430      History   Chief Complaint Chief Complaint  Patient presents with   Nasal Congestion    Cold, congestion, fever, cough, extremely tired. - Entered by patient    HPI Kathryn Francis is a 61 y.o. female.  Patient presents with 2-day history of fever, headache, congestion, cough.  Tmax 100.2.  No rash, shortness of breath, vomiting, diarrhea, or other symptoms.  Treatment at home with OTC cold and flu medications.  Patient had a video visit with her PCP on 08/27/2022; diagnosed with acute laryngitis; treated with prednisone and famotidine.  Her medical history includes hypertension, hypothyroidism, hypercholesterolemia, melanoma.  The history is provided by the patient and medical records.    Past Medical History:  Diagnosis Date   Hypercholesterolemia    Hypertension    Hypothyroidism    Melanoma ([place])    Patella fracture    right    Patient Active Problem List   Diagnosis Date Noted   Acute laryngitis 09/10/2022   Low back pain 11/24/2021   Cough 08/26/2021   Hematuria 05/13/2020   SOB (shortness of breath) 05/11/2020   UTI (urinary tract infection) 05/01/2020   COVID-19 08/28/2019   Hyperglycemia 11/10/2018   Tinnitus 07/10/2018   Numbness and tingling of right leg 09/27/2016   Rash 03/30/2016   Lump of axilla 07/31/2015   Health care maintenance 03/25/2015   Light headedness 10/10/2014   Osteopenia 12/18/2013   History of colonic polyps 07/26/2013   Hypertension 09/30/2012   Hypercholesterolemia 09/30/2012   Hypothyroidism 09/30/2012   History of melanoma 09/30/2012    Past Surgical History:  Procedure Laterality Date   BUNIONECTOMY  1999, 2000   ORIF ELBOW FRACTURE Left 06/16/2018   Procedure: OPEN REDUCTION INTERNAL FIXATION (ORIF) ELBOW/OLECRANON FRACTURE;  Surgeon: Hessie Knows, MD;  Location: [facility] ORS;  Service: Orthopedics;  Laterality: Left;    OB History   No  obstetric history on file.      Home Medications    Prior to Admission medications   Medication Sig Start Date End Date Taking? Authorizing Provider  alendronate (FOSAMAX) 70 MG tablet Take 70 mg by mouth once a week.  Patient states she was told to stop this medication 4 months ago by the lung doctor. Take with a full glass of water on an empty stomach.   Yes [provider]  atorvastatin (LIPITOR) 10 MG tablet TAKE 1 TABLET BY MOUTH EVERY DAY 05/16/22  Yes Einar Pheasant, MD  ezetimibe (ZETIA) 10 MG tablet Take 1 tablet (10 mg total) by mouth daily. 01/22/22 01/17/23 Yes Einar Pheasant, MD  famotidine (PEPCID) 20 MG tablet Take 1 tablet (20 mg total) by mouth 2 (two) times daily. 08/27/22  Yes Carollee Leitz, MD  levothyroxine (SYNTHROID) 75 MCG tablet TAKE 1 TABLET BY MOUTH EVERY DAY 01/08/22  Yes Einar Pheasant, MD  metoprolol tartrate (LOPRESSOR) 25 MG tablet TAKE 1/2 TABLET BY MOUTH EVERY DAY 08/18/22  Yes Einar Pheasant, MD  Multiple Vitamin (MULTIVITAMIN) tablet Take 1 tablet by mouth daily.   Yes [provider]  zolpidem (AMBIEN) 5 MG tablet TAKE 1 TABLET BY MOUTH EVERY DAY AT BEDTIME AS NEEDED 07/03/22  Yes Einar Pheasant, MD    Family History Family History  Problem Relation Age of Onset   Hypertension Other        siblings   Hypercholesterolemia Other        siblings  Heart disease Other        parent   Hypertension Other        parent   Lupus Other        sister   Breast cancer Neg Hx     Social History Social History   Tobacco Use   Smoking status: Never   Smokeless tobacco: Never  Substance Use Topics   Alcohol use: No    Alcohol/week: 0.0 standard drinks of alcohol   Drug use: No     Allergies   Patient has no known allergies.   Review of Systems Review of Systems  Constitutional:  Positive for fever. Negative for chills.  HENT:  Positive for congestion. Negative for ear pain and sore throat.   Respiratory:  Positive for cough.  Negative for shortness of breath.   Cardiovascular:  Negative for chest pain and palpitations.  Gastrointestinal:  Negative for diarrhea and vomiting.  Skin:  Negative for color change and rash.  Neurological:  Positive for headaches.  All other systems reviewed and are negative.    Physical Exam Triage Vital Signs ED Triage Vitals  Enc Vitals Group     BP 10/14/22 1439 (!) 147/89     Pulse Rate 10/14/22 1439 95     Resp 10/14/22 1439 16     Temp 10/14/22 1439 98.5 F (36.9 C)     Temp Source 10/14/22 1439 Oral     SpO2 10/14/22 1439 95 %     Weight 10/14/22 1437 125 lb (56.7 kg)     Height 10/14/22 1437 '5\' 4"'$  (1.626 m)     Head Circumference --      Peak Flow --      Pain Score 10/14/22 1437 7     Pain Loc --      Pain Edu? --      Excl. in [place]? --    No data found.  Updated Vital Signs BP (!) 147/89 (BP Location: Right Arm)   Pulse 95   Temp 98.5 F (36.9 C) (Oral)   Resp 16   Ht '5\' 4"'$  (1.626 m)   Wt 125 lb (56.7 kg)   SpO2 95%   BMI 21.46 kg/m   Visual Acuity Right Eye Distance:   Left Eye Distance:   Bilateral Distance:    Right Eye Near:   Left Eye Near:    Bilateral Near:     Physical Exam Vitals and nursing note reviewed.  Constitutional:      General: She is not in acute distress.    Appearance: Normal appearance. She is well-developed. She is not ill-appearing.  HENT:     Right Ear: Tympanic membrane normal.     Left Ear: Tympanic membrane normal.     Nose: Congestion present.     Mouth/Throat:     Mouth: Mucous membranes are moist.     Pharynx: Oropharynx is clear.  Cardiovascular:     Rate and Rhythm: Normal rate and regular rhythm.     Heart sounds: Normal heart sounds.  Pulmonary:     Effort: Pulmonary effort is normal. No respiratory distress.     Breath sounds: Normal breath sounds.  Musculoskeletal:     Cervical back: Neck supple.  Skin:    General: Skin is warm and dry.  Neurological:     Mental Status: She is alert.   Psychiatric:        Mood and Affect: Mood normal.        Behavior: Behavior  normal.      UC Treatments / Results  Labs (all labs ordered are listed, but only abnormal results are displayed) Labs Reviewed  SARS CORONAVIRUS 2 (TAT 6-24 HRS)    EKG   Radiology No results found.  Procedures Procedures (including critical care time)  Medications Ordered in UC Medications - No data to display  Initial Impression / Assessment and Plan / UC Course  I have reviewed the triage vital signs and the nursing notes.  Pertinent labs & imaging results that were available during my care of the patient were reviewed by me and considered in my medical decision making (see chart for details).    Viral illness.  COVID pending.  If COVID positive, would recommend treatment with Paxlovid; (will need to hold statin); last GFR 67 on 06/13/2022.  Discussed symptomatic treatment including Tylenol or ibuprofen, rest, hydration.  Instructed patient to follow up with PCP if symptoms are not improving.  She agrees to plan of care.   Final Clinical Impressions(s) / UC Diagnoses   Final diagnoses:  Viral illness     Discharge Instructions      Your COVID test is pending.    Take Tylenol or ibuprofen as needed for fever or discomfort.  Rest and keep yourself hydrated.    Follow-up with your primary care provider if your symptoms are not improving.         ED Prescriptions   None    PDMP not reviewed this encounter.   Sharion Balloon, NP 10/14/22 [phone]

## 2022-10-14 NOTE — ED Triage Notes (Signed)
Pt states that she also has a headache.

## 2022-10-14 NOTE — Discharge Instructions (Addendum)
Your COVID test is pending.    Take Tylenol or ibuprofen as needed for fever or discomfort.  Rest and keep yourself hydrated.    Follow-up with your primary care provider if your symptoms are not improving.

## 2022-10-14 NOTE — ED Triage Notes (Signed)
X2 days  Pt states that she has a cough, nasal congestion and fever.

## 2022-10-16 LAB — SARS CORONAVIRUS 2 (TAT 6-24 HRS): SARS Coronavirus 2: NEGATIVE

## 2022-10-24 ENCOUNTER — Other Ambulatory Visit (INDEPENDENT_AMBULATORY_CARE_PROVIDER_SITE_OTHER): Payer: BC Managed Care – PPO

## 2022-10-24 DIAGNOSIS — I10 Essential (primary) hypertension: Secondary | ICD-10-CM | POA: Diagnosis not present

## 2022-10-24 DIAGNOSIS — R739 Hyperglycemia, unspecified: Secondary | ICD-10-CM

## 2022-10-24 DIAGNOSIS — E7800 Pure hypercholesterolemia, unspecified: Secondary | ICD-10-CM | POA: Diagnosis not present

## 2022-10-24 LAB — HEPATIC FUNCTION PANEL
ALT: 43 U/L — ABNORMAL HIGH (ref 0–35)
AST: 30 U/L (ref 0–37)
Albumin: 4.7 g/dL (ref 3.5–5.2)
Alkaline Phosphatase: 56 U/L (ref 39–117)
Bilirubin, Direct: 0 mg/dL (ref 0.0–0.3)
Total Bilirubin: 0.4 mg/dL (ref 0.2–1.2)
Total Protein: 7.3 g/dL (ref 6.0–8.3)

## 2022-10-24 LAB — LDL CHOLESTEROL, DIRECT: Direct LDL: 92 mg/dL

## 2022-10-24 LAB — LIPID PANEL
Cholesterol: 207 mg/dL — ABNORMAL HIGH (ref 0–200)
HDL: 48 mg/dL (ref >39.00)
NonHDL: 158.57
Total CHOL/HDL Ratio: 4
Triglycerides: 400 mg/dL — ABNORMAL HIGH (ref 0.0–149.0)
VLDL: 80 mg/dL — ABNORMAL HIGH (ref 0.0–40.0)

## 2022-10-24 LAB — HEMOGLOBIN A1C: Hgb A1c MFr Bld: 6.2 % (ref 4.6–6.5)

## 2022-10-24 LAB — BASIC METABOLIC PANEL
BUN: 17 mg/dL (ref 6–23)
CO2: 28 mEq/L (ref 19–32)
Calcium: 9.7 mg/dL (ref 8.4–10.5)
Chloride: 102 mEq/L (ref 96–112)
Creatinine, Ser: 0.75 mg/dL (ref 0.40–1.20)
GFR: 85.11 mL/min (ref >60.00)
Glucose, Bld: 94 mg/dL (ref 70–99)
Potassium: 4.5 mEq/L (ref 3.5–5.1)
Sodium: 140 mEq/L (ref 135–145)

## 2022-10-29 ENCOUNTER — Other Ambulatory Visit (HOSPITAL_COMMUNITY)
Admission: RE | Admit: 2022-10-29 | Discharge: 2022-10-29 | Disposition: A | Discharge status: Home / Self Care | Payer: BC Managed Care – PPO | Source: Ambulatory Visit | Attending: Internal Medicine | Admitting: Internal Medicine

## 2022-10-29 ENCOUNTER — Ambulatory Visit (INDEPENDENT_AMBULATORY_CARE_PROVIDER_SITE_OTHER): Payer: BC Managed Care – PPO | Admitting: Internal Medicine

## 2022-10-29 ENCOUNTER — Encounter: Payer: Self-pay | Admitting: Internal Medicine

## 2022-10-29 VITALS — BP 132/80 | HR 83 | Temp 98.4°F | Resp 16 | Ht 64.0 in | Wt 128.0 lb

## 2022-10-29 DIAGNOSIS — Z124 Encounter for screening for malignant neoplasm of cervix: Secondary | ICD-10-CM | POA: Insufficient documentation

## 2022-10-29 DIAGNOSIS — Z1231 Encounter for screening mammogram for malignant neoplasm of breast: Secondary | ICD-10-CM

## 2022-10-29 DIAGNOSIS — I10 Essential (primary) hypertension: Secondary | ICD-10-CM

## 2022-10-29 DIAGNOSIS — Z85820 Personal history of malignant melanoma of skin: Secondary | ICD-10-CM

## 2022-10-29 DIAGNOSIS — Z0000 Encounter for general adult medical examination without abnormal findings: Secondary | ICD-10-CM

## 2022-10-29 DIAGNOSIS — E039 Hypothyroidism, unspecified: Secondary | ICD-10-CM

## 2022-10-29 DIAGNOSIS — E7800 Pure hypercholesterolemia, unspecified: Secondary | ICD-10-CM

## 2022-10-29 DIAGNOSIS — R739 Hyperglycemia, unspecified: Secondary | ICD-10-CM

## 2022-10-29 MED ORDER — ATORVASTATIN CALCIUM 10 MG PO TABS: 10.0000 mg | ORAL_TABLET | Freq: Every day | ORAL | 2 refills | Status: DC

## 2022-10-29 MED ORDER — LEVOTHYROXINE SODIUM 75 MCG PO TABS: 75.0000 ug | ORAL_TABLET | Freq: Every day | ORAL | 3 refills | Status: DC

## 2022-10-29 MED ORDER — EZETIMIBE 10 MG PO TABS: 10.0000 mg | ORAL_TABLET | Freq: Every day | ORAL | 3 refills | Status: DC

## 2022-10-29 MED ORDER — METOPROLOL TARTRATE 25 MG PO TABS: 12.5000 mg | ORAL_TABLET | Freq: Every day | ORAL | 1 refills | Status: DC

## 2022-10-29 NOTE — Progress Notes (Signed)
Subjective:    Patient ID: Kathryn Francis, female    DOB: 02-10-1961, 62 y.o.   MRN: [id number]  Patient here for  Chief Complaint  Patient presents with   Annual Exam    CPE w/ pap    HPI Here for a physical exam.  Was seen 10/14/22 - diagnosed with viral illness.  Presented with fever, cough and congestion. Treated with mucinex.  Symptoms have resolved.  Staying very active.  Exercises regularly.  No chest pain or sob reported.  No acid reflux.  No abdominal pain.  Bowels moving.  Calcium score 06/24/22 - zero.    Past Medical History:  Diagnosis Date   Hypercholesterolemia    Hypertension    Hypothyroidism    Melanoma ([place])    Patella fracture    right   Past Surgical History:  Procedure Laterality Date   BUNIONECTOMY  1999, 2000   ORIF ELBOW FRACTURE Left 06/16/2018   Procedure: OPEN REDUCTION INTERNAL FIXATION (ORIF) ELBOW/OLECRANON FRACTURE;  Surgeon: Hessie Knows, MD;  Location: [facility] OR[facility];  Service: Orthopedics;  Laterality: Left;   Family History  Problem Relation Age of Onset   Hypertension Other        siblings   Hypercholesterolemia Other        siblings   Heart disease Other        parent   Hypertension Other        parent   Lupus Other        sister   Breast cancer Neg Hx    Social History   Socioeconomic History   Marital status: Married    Spouse name: Not on file   Number of children: 2   Years of education: Not on file   Highest education level: Not on file  Occupational History   Not on file  Tobacco Use   Smoking status: Never   Smokeless tobacco: Never  Substance and Sexual Activity   Alcohol use: No    Alcohol/week: 0.0 standard drinks of alcohol   Drug use: No   Sexual activity: Not on file  Other Topics Concern   Not on file  Social History Narrative   Not on file   Social Determinants of Health   Financial Resource Strain: Not on file  Food Insecurity: Not on file  Transportation Needs: Not on file  Physical Activity:  Not on file  Stress: Not on file  Social Connections: Not on file     Review of Systems  Constitutional:  Negative for appetite change and unexpected weight change.  HENT:  Negative for congestion and sinus pressure.   Respiratory:  Negative for cough, chest tightness and shortness of breath.   Cardiovascular:  Negative for chest pain, palpitations and leg swelling.  Gastrointestinal:  Negative for abdominal pain, diarrhea, nausea and vomiting.  Genitourinary:  Negative for difficulty urinating and dysuria.  Musculoskeletal:  Negative for joint swelling and myalgias.  Skin:  Negative for color change and rash.  Neurological:  Negative for dizziness and headaches.  Psychiatric/Behavioral:  Negative for agitation and dysphoric mood.        Objective:     BP 132/80 (BP Location: Left Arm, Patient Position: Sitting, Cuff Size: Small)   Pulse 83   Temp 98.4 F (36.9 C) (Temporal)   Resp 16   Ht '5\' 4"'$  (1.626 m)   Wt 128 lb (58.1 kg)   SpO2 95%   BMI 21.97 kg/m  Wt Readings from Last 3 Encounters:  10/29/22 128 lb (58.1 kg)  10/14/22 125 lb (56.7 kg)  08/27/22 124 lb (56.2 kg)    Physical Exam Vitals reviewed.  Constitutional:      General: She is not in acute distress.    Appearance: Normal appearance. She is well-developed.  HENT:     Head: Normocephalic and atraumatic.     Right Ear: External ear normal.     Left Ear: External ear normal.  Eyes:     General: No scleral icterus.       Right eye: No discharge.        Left eye: No discharge.     Conjunctiva/sclera: Conjunctivae normal.  Neck:     Thyroid: No thyromegaly.  Cardiovascular:     Rate and Rhythm: Normal rate and regular rhythm.  Pulmonary:     Effort: No tachypnea, accessory muscle usage or respiratory distress.     Breath sounds: Normal breath sounds. No decreased breath sounds, wheezing or rhonchi.  Chest:  Breasts:    Right: No inverted nipple, mass, nipple discharge or tenderness (no axillary  adenopathy).     Left: No inverted nipple, mass, nipple discharge or tenderness (no axilarry adenopathy).  Abdominal:     General: Bowel sounds are normal.     Palpations: Abdomen is soft.     Tenderness: There is no abdominal tenderness.  Genitourinary:    Comments: Normal external genitalia.  Vaginal vault without lesions.  Cervix identified.  Pap smear performed.  Could not appreciate any adnexal masses or tenderness.   Musculoskeletal:        General: No swelling or tenderness.     Cervical back: Neck supple.  Lymphadenopathy:     Cervical: No cervical adenopathy.  Skin:    General: Skin is warm.     Findings: No erythema or rash.  Neurological:     Mental Status: She is alert and oriented to person, place, and time.  Psychiatric:        Mood and Affect: Mood normal.        Behavior: Behavior normal.      Outpatient Encounter Medications as of 10/29/2022  Medication Sig   alendronate (FOSAMAX) 70 MG tablet Take 70 mg by mouth once a week.  Patient states she was told to stop this medication 4 months ago by the lung doctor. Take with a full glass of water on an empty stomach.   Multiple Vitamin (MULTIVITAMIN) tablet Take 1 tablet by mouth daily.   zolpidem (AMBIEN) 5 MG tablet TAKE 1 TABLET BY MOUTH EVERY DAY AT BEDTIME AS NEEDED   [DISCONTINUED] atorvastatin (LIPITOR) 10 MG tablet TAKE 1 TABLET BY MOUTH EVERY DAY   [DISCONTINUED] ezetimibe (ZETIA) 10 MG tablet Take 1 tablet (10 mg total) by mouth daily.   [DISCONTINUED] levothyroxine (SYNTHROID) 75 MCG tablet TAKE 1 TABLET BY MOUTH EVERY DAY   [DISCONTINUED] metoprolol tartrate (LOPRESSOR) 25 MG tablet TAKE 1/2 TABLET BY MOUTH EVERY DAY   atorvastatin (LIPITOR) 10 MG tablet Take 1 tablet (10 mg total) by mouth daily.   ezetimibe (ZETIA) 10 MG tablet Take 1 tablet (10 mg total) by mouth daily.   levothyroxine (SYNTHROID) 75 MCG tablet Take 1 tablet (75 mcg total) by mouth daily.   metoprolol tartrate (LOPRESSOR) 25 MG tablet  Take 0.5 tablets (12.5 mg total) by mouth daily.   [DISCONTINUED] famotidine (PEPCID) 20 MG tablet Take 1 tablet (20 mg total) by mouth 2 (two) times daily.   No facility-administered encounter medications on file as  of 10/29/2022.     Lab Results  Component Value Date   WBC 5.0 06/13/2022   HGB 13.6 06/13/2022   HCT 40.1 06/13/2022   PLT 249.0 06/13/2022   GLUCOSE 94 10/24/2022   CHOL 207 (H) 10/24/2022   TRIG (H) 10/24/2022    400.0 Triglyceride is over 400; calculations on Lipids are invalid.   HDL 48.00 10/24/2022   LDLDIRECT 92.0 10/24/2022   LDLCALC UNABLE TO CALCULATE IF TRIGLYCERIDE OVER 400 mg/dL 11/19/2020   ALT 43 (H) 10/24/2022   AST 30 10/24/2022   NA 140 10/24/2022   K 4.5 10/24/2022   CL 102 10/24/2022   CREATININE 0.75 10/24/2022   BUN 17 10/24/2022   CO2 28 10/24/2022   TSH 1.84 06/13/2022   HGBA1C 6.2 10/24/2022    No results found.     Assessment & Plan:  Routine general medical examination at a health care facility  Health care maintenance Assessment & Plan: Physical today 10/29/22.  PAP 03/16/19 - negative with negative HPV.  Repeat today. Colonoscopy 01/02/21.  Mammogram 12/26/21- [facility] I.    Screening for cervical cancer -     Cytology - PAP  Encounter for screening mammogram for malignant neoplasm of breast -     3D Screening Mammogram, Left and Right; Future  Primary hypertension Assessment & Plan: On metoprolol.  Blood pressure as outlined.  Follow pressures.  Check metabolic panel.   Orders: -     Basic metabolic panel; Future  Hyperglycemia Assessment & Plan: Watches her diet.  Has lost weight.  Exercises regularly.  Follow met b and a1c.   Lab Results  Component Value Date   HGBA1C 6.2 10/24/2022    Orders: -     Hemoglobin A1c; Future  Hypercholesterolemia Assessment & Plan: She has adjusted her diet.  Exercises regularly. Feels good.  Follow lipid panel and liver function tests.  Continue lipitor and zetia.   Orders: -      Lipid panel; Future -     Hepatic function panel; Future  History of melanoma Assessment & Plan: Followed by dermatology.    Hypothyroidism, unspecified type Assessment & Plan: On thyroid replacement.  Follow tsh.    Other orders -     Atorvastatin Calcium; Take 1 tablet (10 mg total) by mouth daily.  Dispense: 90 tablet; Refill: 2 -     Ezetimibe; Take 1 tablet (10 mg total) by mouth daily.  Dispense: 90 tablet; Refill: 3 -     Levothyroxine Sodium; Take 1 tablet (75 mcg total) by mouth daily.  Dispense: 90 tablet; Refill: 3 -     Metoprolol Tartrate; Take 0.5 tablets (12.5 mg total) by mouth daily.  Dispense: 45 tablet; Refill: 1     Einar Pheasant, MD

## 2022-10-29 NOTE — Assessment & Plan Note (Addendum)
Physical today 10/29/22.  PAP 03/16/19 - negative with negative HPV.  Repeat today. Colonoscopy 01/02/21.  Mammogram 12/26/21- [facility] I.

## 2022-10-29 NOTE — Patient Instructions (Signed)
YOUR MAMMOGRAM IS DUE, PLEASE CALL AND GET THIS SCHEDULED!   A mammogram has been ordered for you.  Please call the below facility to schedule: [facility] - call [phone]

## 2022-10-30 LAB — CYTOLOGY - PAP
Comment: NEGATIVE
Diagnosis: NEGATIVE
High risk HPV: NEGATIVE

## 2022-11-01 ENCOUNTER — Encounter: Payer: Self-pay | Admitting: Internal Medicine

## 2022-11-01 NOTE — Assessment & Plan Note (Signed)
Watches her diet.  Has lost weight.  Exercises regularly.  Follow met b and a1c.   Lab Results  Component Value Date   HGBA1C 6.2 10/24/2022   

## 2022-11-01 NOTE — Assessment & Plan Note (Signed)
On thyroid replacement.  Follow tsh.  

## 2022-11-01 NOTE — Assessment & Plan Note (Signed)
Followed by dermatology

## 2022-11-01 NOTE — Assessment & Plan Note (Signed)
On metoprolol.  Blood pressure as outlined.  Follow pressures.  Check metabolic panel.

## 2022-11-01 NOTE — Assessment & Plan Note (Signed)
She has adjusted her diet.  Exercises regularly. Feels good.  Follow lipid panel and liver function tests.  Continue lipitor and zetia.  

## 2022-11-02 ENCOUNTER — Encounter: Payer: Self-pay | Admitting: Internal Medicine

## 2022-11-27 ENCOUNTER — Encounter: Payer: Self-pay | Admitting: Internal Medicine

## 2022-12-01 ENCOUNTER — Ambulatory Visit: Payer: BC Managed Care – PPO | Admitting: Internal Medicine

## 2022-12-01 ENCOUNTER — Other Ambulatory Visit: Payer: Self-pay | Admitting: Internal Medicine

## 2022-12-01 ENCOUNTER — Encounter: Payer: Self-pay | Admitting: Internal Medicine

## 2022-12-01 VITALS — BP 132/76 | HR 90 | Temp 98.2°F | Resp 16 | Ht 64.0 in | Wt 123.6 lb

## 2022-12-01 DIAGNOSIS — R739 Hyperglycemia, unspecified: Secondary | ICD-10-CM

## 2022-12-01 DIAGNOSIS — E7800 Pure hypercholesterolemia, unspecified: Secondary | ICD-10-CM | POA: Diagnosis not present

## 2022-12-01 DIAGNOSIS — I10 Essential (primary) hypertension: Secondary | ICD-10-CM | POA: Diagnosis not present

## 2022-12-01 DIAGNOSIS — E039 Hypothyroidism, unspecified: Secondary | ICD-10-CM | POA: Diagnosis not present

## 2022-12-01 MED ORDER — TELMISARTAN 20 MG PO TABS: 20.0000 mg | ORAL_TABLET | Freq: Every day | ORAL | 2 refills | Status: DC

## 2022-12-01 NOTE — Progress Notes (Signed)
Subjective:    Patient ID: Kathryn Francis, female    DOB: 1961/03/28, 62 y.o.   MRN: [id number]  Patient here for  Chief Complaint  Patient presents with   Hypertension    HPI Here as a work in - elevated blood pressure.  Has been checking - outside checks - remaining a little elevated.  Discussed medication changes.  She is exercising.  Staying active.  No chest pain or sob.  No abdominal pain or bowel change reported.  She is eating better.  No new otc supplements of decongestants.  Previous headache.  New glasses.  Resolved.     Past Medical History:  Diagnosis Date   Hypercholesterolemia    Hypertension    Hypothyroidism    Melanoma ([place])    Patella fracture    right   Past Surgical History:  Procedure Laterality Date   BUNIONECTOMY  1999, 2000   ORIF ELBOW FRACTURE Left 06/16/2018   Procedure: OPEN REDUCTION INTERNAL FIXATION (ORIF) ELBOW/OLECRANON FRACTURE;  Surgeon: Hessie Knows, MD;  Location: [facility] OR[facility];  Service: Orthopedics;  Laterality: Left;   Family History  Problem Relation Age of Onset   Hypertension Other        siblings   Hypercholesterolemia Other        siblings   Heart disease Other        parent   Hypertension Other        parent   Lupus Other        sister   Breast cancer Neg Hx    Social History   Socioeconomic History   Marital status: Married    Spouse name: Not on file   Number of children: 2   Years of education: Not on file   Highest education level: Not on file  Occupational History   Not on file  Tobacco Use   Smoking status: Never   Smokeless tobacco: Never  Substance and Sexual Activity   Alcohol use: No    Alcohol/week: 0.0 standard drinks of alcohol   Drug use: No   Sexual activity: Not on file  Other Topics Concern   Not on file  Social History Narrative   Not on file   Social Determinants of Health   Financial Resource Strain: Not on file  Food Insecurity: Not on file  Transportation Needs: Not on file   Physical Activity: Not on file  Stress: Not on file  Social Connections: Not on file     Review of Systems  Constitutional:  Negative for appetite change and unexpected weight change.  HENT:  Negative for congestion and sinus pressure.   Respiratory:  Negative for cough, chest tightness and shortness of breath.   Cardiovascular:  Negative for chest pain, palpitations and leg swelling.  Gastrointestinal:  Negative for abdominal pain, diarrhea, nausea and vomiting.  Genitourinary:  Negative for difficulty urinating and dysuria.  Musculoskeletal:  Negative for joint swelling and myalgias.  Skin:  Negative for color change and rash.  Neurological:  Negative for dizziness.       No headache now.   Psychiatric/Behavioral:  Negative for agitation and dysphoric mood.        Objective:     BP 132/76   Pulse 90   Temp 98.2 F (36.8 C)   Resp 16   Ht 5' 4"$  (1.626 m)   Wt 123 lb 9.6 oz (56.1 kg)   SpO2 97%   BMI 21.22 kg/m  Wt Readings from Last 3 Encounters:  12/01/22 123 lb 9.6 oz (56.1 kg)  10/29/22 128 lb (58.1 kg)  10/14/22 125 lb (56.7 kg)    Physical Exam Vitals reviewed.  Constitutional:      General: She is not in acute distress.    Appearance: Normal appearance.  HENT:     Head: Normocephalic and atraumatic.     Right Ear: External ear normal.     Left Ear: External ear normal.  Eyes:     General: No scleral icterus.       Right eye: No discharge.        Left eye: No discharge.     Conjunctiva/sclera: Conjunctivae normal.  Neck:     Thyroid: No thyromegaly.  Cardiovascular:     Rate and Rhythm: Normal rate and regular rhythm.  Pulmonary:     Effort: No respiratory distress.     Breath sounds: Normal breath sounds. No wheezing.  Abdominal:     General: Bowel sounds are normal.     Palpations: Abdomen is soft.     Tenderness: There is no abdominal tenderness.  Musculoskeletal:        General: No swelling or tenderness.     Cervical back: Neck supple.  No tenderness.  Lymphadenopathy:     Cervical: No cervical adenopathy.  Skin:    Findings: No erythema or rash.  Neurological:     Mental Status: She is alert.  Psychiatric:        Mood and Affect: Mood normal.        Behavior: Behavior normal.      Outpatient Encounter Medications as of 12/01/2022  Medication Sig   telmisartan (MICARDIS) 20 MG tablet Take 1 tablet (20 mg total) by mouth daily.   alendronate (FOSAMAX) 70 MG tablet Take 70 mg by mouth once a week.  Patient states she was told to stop this medication 4 months ago by the lung doctor. Take with a full glass of water on an empty stomach.   atorvastatin (LIPITOR) 10 MG tablet Take 1 tablet (10 mg total) by mouth daily.   ezetimibe (ZETIA) 10 MG tablet Take 1 tablet (10 mg total) by mouth daily.   levothyroxine (SYNTHROID) 75 MCG tablet Take 1 tablet (75 mcg total) by mouth daily.   metoprolol tartrate (LOPRESSOR) 25 MG tablet Take 0.5 tablets (12.5 mg total) by mouth daily.   Multiple Vitamin (MULTIVITAMIN) tablet Take 1 tablet by mouth daily.   [DISCONTINUED] zolpidem (AMBIEN) 5 MG tablet TAKE 1 TABLET BY MOUTH EVERY DAY AT BEDTIME AS NEEDED   No facility-administered encounter medications on file as of 12/01/2022.     Lab Results  Component Value Date   WBC 5.0 06/13/2022   HGB 13.6 06/13/2022   HCT 40.1 06/13/2022   PLT 249.0 06/13/2022   GLUCOSE 94 10/24/2022   CHOL 207 (H) 10/24/2022   TRIG (H) 10/24/2022    400.0 Triglyceride is over 400; calculations on Lipids are invalid.   HDL 48.00 10/24/2022   LDLDIRECT 92.0 10/24/2022   LDLCALC UNABLE TO CALCULATE IF TRIGLYCERIDE OVER 400 mg/dL 11/19/2020   ALT 43 (H) 10/24/2022   AST 30 10/24/2022   NA 140 10/24/2022   K 4.5 10/24/2022   CL 102 10/24/2022   CREATININE 0.75 10/24/2022   BUN 17 10/24/2022   CO2 28 10/24/2022   TSH 1.84 06/13/2022   HGBA1C 6.2 10/24/2022    No results found.     Assessment & Plan:  Primary hypertension Assessment &  Plan: On metoprolol.  Blood pressure as outlined. Given persistent elevation, will add micardis.  Follow pressures.  Check metabolic panel in 123456 days.    Orders: -     Basic metabolic panel; Future  Hypercholesterolemia Assessment & Plan: She has adjusted her diet.  Exercises regularly. Feels good.  Follow lipid panel and liver function tests.  Continue lipitor and zetia.    Hyperglycemia Assessment & Plan: Watches her diet.  Has lost weight.  Exercises regularly.  Follow met b and a1c.   Lab Results  Component Value Date   HGBA1C 6.2 10/24/2022     Hypothyroidism, unspecified type Assessment & Plan: On thyroid replacement.  Follow tsh.    Other orders -     Telmisartan; Take 1 tablet (20 mg total) by mouth daily.  Dispense: 30 tablet; Refill: 2     Einar Pheasant, MD

## 2022-12-03 NOTE — Telephone Encounter (Signed)
Rx ok'd for [facility] #30 with no refills.

## 2022-12-07 ENCOUNTER — Encounter: Payer: Self-pay | Admitting: Internal Medicine

## 2022-12-07 NOTE — Assessment & Plan Note (Signed)
She has adjusted her diet.  Exercises regularly. Feels good.  Follow lipid panel and liver function tests.  Continue lipitor and zetia.

## 2022-12-07 NOTE — Assessment & Plan Note (Signed)
On metoprolol.  Blood pressure as outlined. Given persistent elevation, will add micardis.  Follow pressures.  Check metabolic panel in 123456 days.

## 2022-12-07 NOTE — Assessment & Plan Note (Signed)
Watches her diet.  Has lost weight.  Exercises regularly.  Follow met b and a1c.   Lab Results  Component Value Date   HGBA1C 6.2 10/24/2022

## 2022-12-07 NOTE — Assessment & Plan Note (Signed)
On thyroid replacement.  Follow tsh.  

## 2022-12-16 ENCOUNTER — Other Ambulatory Visit (INDEPENDENT_AMBULATORY_CARE_PROVIDER_SITE_OTHER): Payer: BC Managed Care – PPO

## 2022-12-16 DIAGNOSIS — I10 Essential (primary) hypertension: Secondary | ICD-10-CM | POA: Diagnosis not present

## 2022-12-16 LAB — BASIC METABOLIC PANEL
BUN: 16 mg/dL (ref 6–23)
CO2: 30 mEq/L (ref 19–32)
Calcium: 9.8 mg/dL (ref 8.4–10.5)
Chloride: 100 mEq/L (ref 96–112)
Creatinine, Ser: 0.72 mg/dL (ref 0.40–1.20)
GFR: 89.29 mL/min (ref >60.00)
Glucose, Bld: 97 mg/dL (ref 70–99)
Potassium: 3.7 mEq/L (ref 3.5–5.1)
Sodium: 138 mEq/L (ref 135–145)

## 2022-12-30 ENCOUNTER — Ambulatory Visit
Admission: RE | Admit: 2022-12-30 | Discharge: 2022-12-30 | Disposition: A | Discharge status: Home / Self Care | Payer: BC Managed Care – PPO | Source: Ambulatory Visit | Attending: Internal Medicine | Admitting: Internal Medicine

## 2022-12-30 DIAGNOSIS — Z1231 Encounter for screening mammogram for malignant neoplasm of breast: Secondary | ICD-10-CM | POA: Insufficient documentation

## 2023-02-03 ENCOUNTER — Other Ambulatory Visit: Payer: Self-pay

## 2023-02-03 ENCOUNTER — Encounter: Payer: Self-pay | Admitting: Internal Medicine

## 2023-02-03 MED ORDER — EZETIMIBE 10 MG PO TABS: 10.0000 mg | ORAL_TABLET | Freq: Every day | ORAL | 3 refills | Status: DC

## 2023-02-06 ENCOUNTER — Other Ambulatory Visit: Payer: BC Managed Care – PPO

## 2023-02-20 ENCOUNTER — Other Ambulatory Visit (INDEPENDENT_AMBULATORY_CARE_PROVIDER_SITE_OTHER): Payer: BC Managed Care – PPO

## 2023-02-20 DIAGNOSIS — R739 Hyperglycemia, unspecified: Secondary | ICD-10-CM | POA: Diagnosis not present

## 2023-02-20 DIAGNOSIS — E7800 Pure hypercholesterolemia, unspecified: Secondary | ICD-10-CM | POA: Diagnosis not present

## 2023-02-20 DIAGNOSIS — I10 Essential (primary) hypertension: Secondary | ICD-10-CM

## 2023-02-20 LAB — BASIC METABOLIC PANEL
BUN: 15 mg/dL (ref 6–23)
CO2: 28 mEq/L (ref 19–32)
Calcium: 9.7 mg/dL (ref 8.4–10.5)
Chloride: 101 mEq/L (ref 96–112)
Creatinine, Ser: 0.82 mg/dL (ref 0.40–1.20)
GFR: 76.29 mL/min (ref >60.00)
Glucose, Bld: 95 mg/dL (ref 70–99)
Potassium: 4.3 mEq/L (ref 3.5–5.1)
Sodium: 141 mEq/L (ref 135–145)

## 2023-02-20 LAB — LDL CHOLESTEROL, DIRECT: Direct LDL: 94 mg/dL

## 2023-02-20 LAB — HEPATIC FUNCTION PANEL
ALT: 27 U/L (ref 0–35)
AST: 25 U/L (ref 0–37)
Albumin: 4.4 g/dL (ref 3.5–5.2)
Alkaline Phosphatase: 53 U/L (ref 39–117)
Bilirubin, Direct: 0.1 mg/dL (ref 0.0–0.3)
Total Bilirubin: 0.5 mg/dL (ref 0.2–1.2)
Total Protein: 7.2 g/dL (ref 6.0–8.3)

## 2023-02-20 LAB — LIPID PANEL
Cholesterol: 185 mg/dL (ref 0–200)
HDL: 45.7 mg/dL (ref >39.00)
NonHDL: 139.19
Total CHOL/HDL Ratio: 4
Triglycerides: 321 mg/dL — ABNORMAL HIGH (ref 0.0–149.0)
VLDL: 64.2 mg/dL — ABNORMAL HIGH (ref 0.0–40.0)

## 2023-02-20 LAB — HEMOGLOBIN A1C: Hgb A1c MFr Bld: 6.1 % (ref 4.6–6.5)

## 2023-02-25 ENCOUNTER — Other Ambulatory Visit: Payer: Self-pay | Admitting: Internal Medicine

## 2023-03-03 ENCOUNTER — Ambulatory Visit: Payer: BC Managed Care – PPO | Admitting: Internal Medicine

## 2023-03-03 ENCOUNTER — Encounter: Payer: Self-pay | Admitting: Internal Medicine

## 2023-03-03 VITALS — BP 114/72 | HR 85 | Temp 97.9°F | Resp 16 | Ht 64.0 in | Wt 127.0 lb

## 2023-03-03 DIAGNOSIS — Z85820 Personal history of malignant melanoma of skin: Secondary | ICD-10-CM

## 2023-03-03 DIAGNOSIS — M79606 Pain in leg, unspecified: Secondary | ICD-10-CM

## 2023-03-03 DIAGNOSIS — R739 Hyperglycemia, unspecified: Secondary | ICD-10-CM | POA: Diagnosis not present

## 2023-03-03 DIAGNOSIS — E7800 Pure hypercholesterolemia, unspecified: Secondary | ICD-10-CM

## 2023-03-03 DIAGNOSIS — I10 Essential (primary) hypertension: Secondary | ICD-10-CM

## 2023-03-03 DIAGNOSIS — Z820 Family history of epilepsy and other diseases of the nervous system: Secondary | ICD-10-CM

## 2023-03-03 DIAGNOSIS — E039 Hypothyroidism, unspecified: Secondary | ICD-10-CM

## 2023-03-03 NOTE — Progress Notes (Signed)
Subjective:    Patient ID: Kathryn Francis, female    DOB: [birth year], 62 y.o.   MRN: [id number]  Patient here for  Chief Complaint  Patient presents with   Medical Management of Chronic Issues    HPI Here to follow up regarding hypercholesterolemia and hypertension.  Last visit, blood pressure elevated.  Started on micardis.  Continued on metoprolol. Blood pressure better.  Tolerating.  Stays active.  No chest pain or sob reported.  No abdominal pain or bowel change reported.  Has noticed tight sensation - popliteal region.  No swelling.  No redness.  Only notices when has been sitting for a while and then gets up.  Once moving and exercising, resolves.  No persistent pain.     Past Medical History:  Diagnosis Date   Hypercholesterolemia    Hypertension    Hypothyroidism    Melanoma (HCC)    Patella fracture    right   Past Surgical History:  Procedure Laterality Date   BUNIONECTOMY  1999, 2000   ORIF ELBOW FRACTURE Left 06/16/2018   Procedure: OPEN REDUCTION INTERNAL FIXATION (ORIF) ELBOW/OLECRANON FRACTURE;  Surgeon: Kennedy Bucker, MD;  Location: [facility] ORS;  Service: Orthopedics;  Laterality: Left;   Family History  Problem Relation Age of Onset   Hypertension Other        siblings   Hypercholesterolemia Other        siblings   Heart disease Other        parent   Hypertension Other        parent   Lupus Other        sister   Breast cancer Neg Hx    Social History   Socioeconomic History   Marital status: Married    Spouse name: Not on file   Number of children: 2   Years of education: Not on file   Highest education level: Bachelor's degree (e.g., BA, AB, BS)  Occupational History   Not on file  Tobacco Use   Smoking status: Never   Smokeless tobacco: Never  Substance and Sexual Activity   Alcohol use: No    Alcohol/week: 0.0 standard drinks of alcohol   Drug use: No   Sexual activity: Not on file  Other Topics Concern   Not on file  Social History  Narrative   Not on file   Social Determinants of Health   Financial Resource Strain: Low Risk  (02/27/2023)   Overall Financial Resource Strain (CARDIA)    Difficulty of Paying Living Expenses: Not hard at all  Food Insecurity: No Food Insecurity (02/27/2023)   Hunger Vital Sign    Worried About Running Out of Food in the Last Year: Never true    Ran Out of Food in the Last Year: Never true  Transportation Needs: No Transportation Needs (02/27/2023)   PRAPARE - Administrator, Civil Service (Medical): No    Lack of Transportation (Non-Medical): No  Physical Activity: Sufficiently Active (02/27/2023)   Exercise Vital Sign    Days of Exercise per Week: 7 days    Minutes of Exercise per Session: 60 min  Stress: No Stress Concern Present (02/27/2023)   [employer] of Occupational Health - Occupational Stress Questionnaire    Feeling of Stress : Not at all  Social Connections: Socially Integrated (02/27/2023)   Social Connection and Isolation Panel [NHANES]    Frequency of Communication with Friends and Family: More than three times a week    Frequency of  Social Gatherings with Friends and Family: More than three times a week    Attends Religious Services: More than 4 times per year    Active Member of [employer] or Organizations: Yes    Attends Engineer, structural: More than 4 times per year    Marital Status: Married     Review of Systems  Constitutional:  Negative for appetite change and unexpected weight change.  HENT:  Negative for congestion and sinus pressure.   Respiratory:  Negative for cough, chest tightness and shortness of breath.   Cardiovascular:  Negative for chest pain and palpitations.  Gastrointestinal:  Negative for abdominal pain, diarrhea, nausea and vomiting.  Genitourinary:  Negative for difficulty urinating and dysuria.  Musculoskeletal:  Negative for joint swelling and myalgias.       Tight sensation (intermittent) - calf as outlined.  No  swelling. No redness.   Skin:  Negative for color change and rash.  Neurological:  Negative for dizziness, light-headedness and headaches.  Psychiatric/Behavioral:  Negative for agitation and dysphoric mood.        Objective:     BP 114/72   Pulse 85   Temp 97.9 F (36.6 C)   Resp 16   Ht 5\' 4"  (1.626 m)   Wt 127 lb (57.6 kg)   SpO2 98%   BMI 21.80 kg/m  Wt Readings from Last 3 Encounters:  03/03/23 127 lb (57.6 kg)  12/01/22 123 lb 9.6 oz (56.1 kg)  10/29/22 128 lb (58.1 kg)    Physical Exam Vitals reviewed.  Constitutional:      General: She is not in acute distress.    Appearance: Normal appearance.  HENT:     Head: Normocephalic and atraumatic.     Right Ear: External ear normal.     Left Ear: External ear normal.  Eyes:     General: No scleral icterus.       Right eye: No discharge.        Left eye: No discharge.     Conjunctiva/sclera: Conjunctivae normal.  Neck:     Thyroid: No thyromegaly.  Cardiovascular:     Rate and Rhythm: Normal rate and regular rhythm.  Pulmonary:     Effort: No respiratory distress.     Breath sounds: Normal breath sounds. No wheezing.  Abdominal:     General: Bowel sounds are normal.     Palpations: Abdomen is soft.     Tenderness: There is no abdominal tenderness.  Musculoskeletal:        General: No swelling or tenderness.     Cervical back: Neck supple. No tenderness.     Comments: No pain to palpation - leg/calf.   Lymphadenopathy:     Cervical: No cervical adenopathy.  Skin:    Findings: No erythema or rash.  Neurological:     Mental Status: She is alert.  Psychiatric:        Mood and Affect: Mood normal.        Behavior: Behavior normal.      Outpatient Encounter Medications as of 03/03/2023  Medication Sig   alendronate (FOSAMAX) 70 MG tablet Take 70 mg by mouth once a week.  Patient states she was told to stop this medication 4 months ago by the lung doctor. Take with a full glass of water on an empty  stomach.   atorvastatin (LIPITOR) 10 MG tablet Take 1 tablet (10 mg total) by mouth daily.   ezetimibe (ZETIA) 10 MG tablet Take 1 tablet (  10 mg total) by mouth daily.   levothyroxine (SYNTHROID) 75 MCG tablet Take 1 tablet (75 mcg total) by mouth daily.   metoprolol tartrate (LOPRESSOR) 25 MG tablet Take 0.5 tablets (12.5 mg total) by mouth daily.   Multiple Vitamin (MULTIVITAMIN) tablet Take 1 tablet by mouth daily.   telmisartan (MICARDIS) 20 MG tablet TAKE 1 TABLET BY MOUTH EVERY DAY   zolpidem (AMBIEN) 5 MG tablet TAKE 1 TABLET BY MOUTH EVERY DAY AT BEDTIME AS NEEDED   No facility-administered encounter medications on file as of 03/03/2023.     Lab Results  Component Value Date   WBC 5.0 06/13/2022   HGB 13.6 06/13/2022   HCT 40.1 06/13/2022   PLT 249.0 06/13/2022   GLUCOSE 95 02/20/2023   CHOL 185 02/20/2023   TRIG 321.0 (H) 02/20/2023   HDL 45.70 02/20/2023   LDLDIRECT 94.0 02/20/2023   LDLCALC UNABLE TO CALCULATE IF TRIGLYCERIDE OVER 400 mg/dL 16/07/9603   ALT 27 54/06/8118   AST 25 02/20/2023   NA 141 02/20/2023   K 4.3 02/20/2023   CL 101 02/20/2023   CREATININE 0.82 02/20/2023   BUN 15 02/20/2023   CO2 28 02/20/2023   TSH 1.84 06/13/2022   HGBA1C 6.1 02/20/2023    MM 3D SCREEN BREAST BILATERAL  Result Date: 01/01/2023 CLINICAL DATA:  Screening. EXAM: DIGITAL SCREENING BILATERAL MAMMOGRAM WITH TOMOSYNTHESIS AND CAD TECHNIQUE: Bilateral screening digital craniocaudal and mediolateral oblique mammograms were obtained. Bilateral screening digital breast tomosynthesis was performed. The images were evaluated with computer-aided detection. COMPARISON:  Previous exam(s). ACR Breast Density Category b: There are scattered areas of fibroglandular density. FINDINGS: There are no findings suspicious for malignancy. IMPRESSION: No mammographic evidence of malignancy. A result letter of this screening mammogram will be mailed directly to the patient. RECOMMENDATION: Screening  mammogram in one year. (Code:SM-B-01Y) BI-RADS CATEGORY  1: Negative. Electronically Signed   By: Frederico Hamman M.D.   On: 01/01/2023 11:24       Assessment & Plan:  Hypercholesterolemia Assessment & Plan: She has adjusted her diet.  Exercises regularly. Feels good.  Follow lipid panel and liver function tests.  Continue lipitor and zetia.  Lab Results  Component Value Date   CHOL 185 02/20/2023   HDL 45.70 02/20/2023   LDLCALC UNABLE TO CALCULATE IF TRIGLYCERIDE OVER 400 mg/dL 14/78/2956   LDLDIRECT 94.0 02/20/2023   TRIG 321.0 (H) 02/20/2023   CHOLHDL 4 02/20/2023     Orders: -     CBC with Differential/Platelet; Future -     Basic metabolic panel; Future -     Lipid panel; Future -     Hepatic function panel; Future -     TSH; Future  Hyperglycemia Assessment & Plan: Watches her diet.  Exercises regularly.  Follow met b and a1c.   Lab Results  Component Value Date   HGBA1C 6.1 02/20/2023    Orders: -     Hemoglobin A1c; Future  History of melanoma Assessment & Plan: Followed by dermatology.    Primary hypertension Assessment & Plan: On metoprolol and micardis.  Blood pressure doing well. Follow pressures.  Follow metabolic panel.    Hypothyroidism, unspecified type Assessment & Plan: On thyroid replacement.  Follow tsh.    Family history of memory loss Assessment & Plan: Discussed.  Given information regarding Dr Margaretmary Eddy study.  Follow.     Pain of lower extremity, unspecified laterality Assessment & Plan: Describes the tightness (intermittent) in calf as outlined.  No pain to palpation. No  swelling or redness. DP pulses palpable and equal bilaterally.  Stretches.  Avoid strained positions.  Follow.  Call with update.       Dale [place], MD

## 2023-03-03 NOTE — Patient Instructions (Signed)
  [email or web address]  [place] [phone]

## 2023-03-16 ENCOUNTER — Encounter: Payer: Self-pay | Admitting: Internal Medicine

## 2023-03-16 DIAGNOSIS — M79606 Pain in leg, unspecified: Secondary | ICD-10-CM | POA: Insufficient documentation

## 2023-03-16 DIAGNOSIS — Z820 Family history of epilepsy and other diseases of the nervous system: Secondary | ICD-10-CM | POA: Insufficient documentation

## 2023-03-16 NOTE — Assessment & Plan Note (Signed)
She has adjusted her diet.  Exercises regularly. Feels good.  Follow lipid panel and liver function tests.  Continue lipitor and zetia.  Lab Results  Component Value Date   CHOL 185 02/20/2023   HDL 45.70 02/20/2023   LDLCALC UNABLE TO CALCULATE IF TRIGLYCERIDE OVER 400 mg/dL 75/64/3329   LDLDIRECT 94.0 02/20/2023   TRIG 321.0 (H) 02/20/2023   CHOLHDL 4 02/20/2023

## 2023-03-16 NOTE — Assessment & Plan Note (Signed)
On thyroid replacement.  Follow tsh.  

## 2023-03-16 NOTE — Assessment & Plan Note (Signed)
On metoprolol and micardis.  Blood pressure doing well. Follow pressures.  Follow metabolic panel.

## 2023-03-16 NOTE — Assessment & Plan Note (Signed)
Discussed.  Given information regarding Dr Margaretmary Eddy study.  Follow.

## 2023-03-16 NOTE — Assessment & Plan Note (Signed)
Watches her diet.  Exercises regularly.  Follow met b and a1c.   Lab Results  Component Value Date   HGBA1C 6.1 02/20/2023

## 2023-03-16 NOTE — Assessment & Plan Note (Signed)
Followed by dermatology

## 2023-03-16 NOTE — Assessment & Plan Note (Signed)
Describes the tightness (intermittent) in calf as outlined.  No pain to palpation. No swelling or redness. DP pulses palpable and equal bilaterally.  Stretches.  Avoid strained positions.  Follow.  Call with update.

## 2023-05-13 ENCOUNTER — Ambulatory Visit: Payer: BC Managed Care – PPO | Admitting: Internal Medicine

## 2023-06-04 ENCOUNTER — Encounter (INDEPENDENT_AMBULATORY_CARE_PROVIDER_SITE_OTHER): Payer: Self-pay

## 2023-06-09 ENCOUNTER — Other Ambulatory Visit: Payer: Self-pay | Admitting: Internal Medicine

## 2023-06-17 ENCOUNTER — Other Ambulatory Visit: Payer: Self-pay | Admitting: Internal Medicine

## 2023-06-18 NOTE — Telephone Encounter (Signed)
Rx ok'd for ambien #30 with one refill.  

## 2023-07-03 ENCOUNTER — Other Ambulatory Visit (INDEPENDENT_AMBULATORY_CARE_PROVIDER_SITE_OTHER): Payer: BC Managed Care – PPO

## 2023-07-03 DIAGNOSIS — R739 Hyperglycemia, unspecified: Secondary | ICD-10-CM

## 2023-07-03 DIAGNOSIS — E7800 Pure hypercholesterolemia, unspecified: Secondary | ICD-10-CM | POA: Diagnosis not present

## 2023-07-03 LAB — BASIC METABOLIC PANEL
BUN: 17 mg/dL (ref 6–23)
CO2: 29 meq/L (ref 19–32)
Calcium: 9.6 mg/dL (ref 8.4–10.5)
Chloride: 103 meq/L (ref 96–112)
Creatinine, Ser: 0.78 mg/dL (ref 0.40–1.20)
GFR: 80.81 mL/min (ref >60.00)
Glucose, Bld: 88 mg/dL (ref 70–99)
Potassium: 4 meq/L (ref 3.5–5.1)
Sodium: 139 meq/L (ref 135–145)

## 2023-07-03 LAB — LIPID PANEL
Cholesterol: 180 mg/dL (ref 0–200)
HDL: 48.2 mg/dL (ref >39.00)
LDL Cholesterol: 82 mg/dL (ref 0–99)
NonHDL: 132.18
Total CHOL/HDL Ratio: 4
Triglycerides: 253 mg/dL — ABNORMAL HIGH (ref 0.0–149.0)
VLDL: 50.6 mg/dL — ABNORMAL HIGH (ref 0.0–40.0)

## 2023-07-03 LAB — CBC WITH DIFFERENTIAL/PLATELET
Basophils Absolute: 0 10*3/uL (ref 0.0–0.1)
Basophils Relative: 0.7 % (ref 0.0–3.0)
Eosinophils Absolute: 0.1 10*3/uL (ref 0.0–0.7)
Eosinophils Relative: 2.5 % (ref 0.0–5.0)
HCT: 39.5 % (ref 36.0–46.0)
Hemoglobin: 13 g/dL (ref 12.0–15.0)
Lymphocytes Relative: 34.6 % (ref 12.0–46.0)
Lymphs Abs: 1.7 10*3/uL (ref 0.7–4.0)
MCHC: 33 g/dL (ref 30.0–36.0)
MCV: 84.8 fl (ref 78.0–100.0)
Monocytes Absolute: 0.4 10*3/uL (ref 0.1–1.0)
Monocytes Relative: 9 % (ref 3.0–12.0)
Neutro Abs: 2.6 10*3/uL (ref 1.4–7.7)
Neutrophils Relative %: 53.2 % (ref 43.0–77.0)
Platelets: 278 10*3/uL (ref 150.0–400.0)
RBC: 4.65 Mil/uL (ref 3.87–5.11)
RDW: 13 % (ref 11.5–15.5)
WBC: 5 10*3/uL (ref 4.0–10.5)

## 2023-07-03 LAB — HEPATIC FUNCTION PANEL
ALT: 27 U/L (ref 0–35)
AST: 25 U/L (ref 0–37)
Albumin: 4.3 g/dL (ref 3.5–5.2)
Alkaline Phosphatase: 53 U/L (ref 39–117)
Bilirubin, Direct: 0.1 mg/dL (ref 0.0–0.3)
Total Bilirubin: 0.5 mg/dL (ref 0.2–1.2)
Total Protein: 7.2 g/dL (ref 6.0–8.3)

## 2023-07-03 LAB — TSH: TSH: 2 u[IU]/mL (ref 0.35–5.50)

## 2023-07-03 LAB — HEMOGLOBIN A1C: Hgb A1c MFr Bld: 6.1 % (ref 4.6–6.5)

## 2023-07-07 ENCOUNTER — Ambulatory Visit (INDEPENDENT_AMBULATORY_CARE_PROVIDER_SITE_OTHER): Payer: BC Managed Care – PPO | Admitting: Internal Medicine

## 2023-07-07 ENCOUNTER — Encounter: Payer: Self-pay | Admitting: Internal Medicine

## 2023-07-07 VITALS — BP 116/70 | HR 75 | Temp 97.5°F | Ht 64.0 in | Wt 129.8 lb

## 2023-07-07 DIAGNOSIS — Z86010 Personal history of colonic polyps: Secondary | ICD-10-CM | POA: Diagnosis not present

## 2023-07-07 DIAGNOSIS — E039 Hypothyroidism, unspecified: Secondary | ICD-10-CM

## 2023-07-07 DIAGNOSIS — E7800 Pure hypercholesterolemia, unspecified: Secondary | ICD-10-CM | POA: Diagnosis not present

## 2023-07-07 DIAGNOSIS — I10 Essential (primary) hypertension: Secondary | ICD-10-CM | POA: Diagnosis not present

## 2023-07-07 DIAGNOSIS — R739 Hyperglycemia, unspecified: Secondary | ICD-10-CM

## 2023-07-07 NOTE — Progress Notes (Signed)
Subjective:    Patient ID: Kathryn Francis, female    DOB: 01-14-1961, 62 y.o.   MRN: [id number]  Patient here for  Chief Complaint  Patient presents with   Medication Management    HPI Here to follow up regarding hypercholesterolemia and hypertension. On micardis and metoprolol. Doing well.  Staying active.  No chest pain or sob reported.  No cough or congestion.  No abdominal pain or bowel change reported.  Overall feels good.  Just had cataract surgery.  Doing well.    Past Medical History:  Diagnosis Date   Hypercholesterolemia    Hypertension    Hypothyroidism    Melanoma (HCC)    Patella fracture    right   Past Surgical History:  Procedure Laterality Date   BUNIONECTOMY  1999, 2000   ORIF ELBOW FRACTURE Left 06/16/2018   Procedure: OPEN REDUCTION INTERNAL FIXATION (ORIF) ELBOW/OLECRANON FRACTURE;  Surgeon: Kennedy Bucker, MD;  Location: [facility] OR[facility];  Service: Orthopedics;  Laterality: Left;   Family History  Problem Relation Age of Onset   Hypertension Other        siblings   Hypercholesterolemia Other        siblings   Heart disease Other        parent   Hypertension Other        parent   Lupus Other        sister   Breast cancer Neg Hx    Social History   Socioeconomic History   Marital status: Married    Spouse name: Not on file   Number of children: 2   Years of education: Not on file   Highest education level: Bachelor's degree (e.g., BA, AB, BS)  Occupational History   Not on file  Tobacco Use   Smoking status: Never   Smokeless tobacco: Never  Substance and Sexual Activity   Alcohol use: No    Alcohol/week: 0.0 standard drinks of alcohol   Drug use: No   Sexual activity: Not on file  Other Topics Concern   Not on file  Social History Narrative   Not on file   Social Determinants of Health   Financial Resource Strain: Low Risk  (02/27/2023)   Overall Financial Resource Strain (CARDIA)    Difficulty of Paying Living Expenses: Not hard at  all  Food Insecurity: No Food Insecurity (02/27/2023)   Hunger Vital Sign    Worried About Running Out of Food in the Last Year: Never true    Ran Out of Food in the Last Year: Never true  Transportation Needs: No Transportation Needs (02/27/2023)   PRAPARE - Administrator, Civil Service (Medical): No    Lack of Transportation (Non-Medical): No  Physical Activity: Sufficiently Active (02/27/2023)   Exercise Vital Sign    Days of Exercise per Week: 7 days    Minutes of Exercise per Session: 60 min  Stress: No Stress Concern Present (02/27/2023)   [employer] of Occupational Health - Occupational Stress Questionnaire    Feeling of Stress : Not at all  Social Connections: Socially Integrated (02/27/2023)   Social Connection and Isolation Panel [NHANES]    Frequency of Communication with Friends and Family: More than three times a week    Frequency of Social Gatherings with Friends and Family: More than three times a week    Attends Religious Services: More than 4 times per year    Active Member of Clubs or Organizations: Yes    Attends  Club or Organization Meetings: More than 4 times per year    Marital Status: Married     Review of Systems  Constitutional:  Negative for appetite change and unexpected weight change.  HENT:  Negative for congestion and sinus pressure.   Respiratory:  Negative for cough, chest tightness and shortness of breath.   Cardiovascular:  Negative for chest pain, palpitations and leg swelling.  Gastrointestinal:  Negative for abdominal pain, diarrhea, nausea and vomiting.  Genitourinary:  Negative for difficulty urinating and dysuria.  Musculoskeletal:  Negative for joint swelling and myalgias.  Skin:  Negative for color change and rash.  Neurological:  Negative for dizziness and headaches.  Psychiatric/Behavioral:  Negative for agitation and dysphoric mood.        Objective:     BP 116/70   Pulse 75   Temp (!) 97.5 F (36.4 C) (Oral)    Ht 5\' 4"  (1.626 m)   Wt 129 lb 12.8 oz (58.9 kg)   SpO2 100%   BMI 22.28 kg/m  Wt Readings from Last 3 Encounters:  07/07/23 129 lb 12.8 oz (58.9 kg)  03/03/23 127 lb (57.6 kg)  12/01/22 123 lb 9.6 oz (56.1 kg)    Physical Exam Vitals reviewed.  Constitutional:      General: She is not in acute distress.    Appearance: Normal appearance.  HENT:     Head: Normocephalic and atraumatic.     Right Ear: External ear normal.     Left Ear: External ear normal.  Eyes:     General: No scleral icterus.       Right eye: No discharge.        Left eye: No discharge.     Conjunctiva/sclera: Conjunctivae normal.  Neck:     Thyroid: No thyromegaly.  Cardiovascular:     Rate and Rhythm: Normal rate and regular rhythm.  Pulmonary:     Effort: No respiratory distress.     Breath sounds: Normal breath sounds. No wheezing.  Abdominal:     General: Bowel sounds are normal.     Palpations: Abdomen is soft.     Tenderness: There is no abdominal tenderness.  Musculoskeletal:        General: No swelling or tenderness.     Cervical back: Neck supple. No tenderness.  Lymphadenopathy:     Cervical: No cervical adenopathy.  Skin:    Findings: No erythema or rash.  Neurological:     Mental Status: She is alert.  Psychiatric:        Mood and Affect: Mood normal.        Behavior: Behavior normal.      Outpatient Encounter Medications as of 07/07/2023  Medication Sig   alendronate (FOSAMAX) 70 MG tablet Take 70 mg by mouth once a week.  Patient states she was told to stop this medication 4 months ago by the lung doctor. Take with a full glass of water on an empty stomach.   atorvastatin (LIPITOR) 10 MG tablet Take 1 tablet (10 mg total) by mouth daily.   ezetimibe (ZETIA) 10 MG tablet Take 1 tablet (10 mg total) by mouth daily.   levothyroxine (SYNTHROID) 75 MCG tablet Take 1 tablet (75 mcg total) by mouth daily.   metoprolol tartrate (LOPRESSOR) 25 MG tablet Take 0.5 tablets (12.5 mg total)  by mouth daily.   Multiple Vitamin (MULTIVITAMIN) tablet Take 1 tablet by mouth daily.   telmisartan (MICARDIS) 20 MG tablet TAKE 1 TABLET BY MOUTH EVERY DAY  zolpidem (AMBIEN) 5 MG tablet TAKE 1 TABLET BY MOUTH EVERY DAY AT BEDTIME AS NEEDED   No facility-administered encounter medications on file as of 07/07/2023.     Lab Results  Component Value Date   WBC 5.0 07/03/2023   HGB 13.0 07/03/2023   HCT 39.5 07/03/2023   PLT 278.0 07/03/2023   GLUCOSE 88 07/03/2023   CHOL 180 07/03/2023   TRIG 253.0 (H) 07/03/2023   HDL 48.20 07/03/2023   LDLDIRECT 94.0 02/20/2023   LDLCALC 82 07/03/2023   ALT 27 07/03/2023   AST 25 07/03/2023   NA 139 07/03/2023   K 4.0 07/03/2023   CL 103 07/03/2023   CREATININE 0.78 07/03/2023   BUN 17 07/03/2023   CO2 29 07/03/2023   TSH 2.00 07/03/2023   HGBA1C 6.1 07/03/2023    MM 3D SCREEN BREAST BILATERAL  Result Date: 01/01/2023 CLINICAL DATA:  Screening. EXAM: DIGITAL SCREENING BILATERAL MAMMOGRAM WITH TOMOSYNTHESIS AND CAD TECHNIQUE: Bilateral screening digital craniocaudal and mediolateral oblique mammograms were obtained. Bilateral screening digital breast tomosynthesis was performed. The images were evaluated with computer-aided detection. COMPARISON:  Previous exam(s). ACR Breast Density Category b: There are scattered areas of fibroglandular density. FINDINGS: There are no findings suspicious for malignancy. IMPRESSION: No mammographic evidence of malignancy. A result letter of this screening mammogram will be mailed directly to the patient. RECOMMENDATION: Screening mammogram in one year. (Code:SM-B-01Y) BI-RADS CATEGORY  1: Negative. Electronically Signed   By: Frederico Hamman M.D.   On: 01/01/2023 11:24       Assessment & Plan:  Hypercholesterolemia Assessment & Plan: She has adjusted her diet.  Exercises regularly. Feels good.  Follow lipid panel and liver function tests.  Continue lipitor and zetia.  Lab Results  Component Value Date    CHOL 180 07/03/2023   HDL 48.20 07/03/2023   LDLCALC 82 07/03/2023   LDLDIRECT 94.0 02/20/2023   TRIG 253.0 (H) 07/03/2023   CHOLHDL 4 07/03/2023     Orders: -     Lipid panel; Future -     Basic metabolic panel; Future -     Hepatic function panel; Future  Hyperglycemia Assessment & Plan: Watches her diet.  Exercises regularly.  Follow met b and a1c.   Lab Results  Component Value Date   HGBA1C 6.1 07/03/2023    Orders: -     Hemoglobin A1c; Future  History of colonic polyps Assessment & Plan: Colonoscopy 12/2020.  Recommended f/u in 10 years.     Primary hypertension Assessment & Plan: On metoprolol and micardis.  Blood pressure doing well. Follow pressures.  Follow metabolic panel.    Hypothyroidism, unspecified type Assessment & Plan: On thyroid replacement.  Follow tsh.       Dale [place], MD

## 2023-07-07 NOTE — Assessment & Plan Note (Signed)
Watches her diet.  Exercises regularly.  Follow met b and a1c.   Lab Results  Component Value Date   HGBA1C 6.1 07/03/2023

## 2023-07-07 NOTE — Assessment & Plan Note (Signed)
Colonoscopy 12/2020.  Recommended f/u in 10 years.

## 2023-07-07 NOTE — Assessment & Plan Note (Signed)
On metoprolol and micardis.  Blood pressure doing well. Follow pressures.  Follow metabolic panel.

## 2023-07-07 NOTE — Addendum Note (Signed)
Addended by: Rita Ohara D on: 07/07/2023 10:56 AM   Modules accepted: Orders

## 2023-07-07 NOTE — Assessment & Plan Note (Signed)
On thyroid replacement.  Follow tsh.

## 2023-07-07 NOTE — Assessment & Plan Note (Signed)
She has adjusted her diet.  Exercises regularly. Feels good.  Follow lipid panel and liver function tests.  Continue lipitor and zetia.  Lab Results  Component Value Date   CHOL 180 07/03/2023   HDL 48.20 07/03/2023   LDLCALC 82 07/03/2023   LDLDIRECT 94.0 02/20/2023   TRIG 253.0 (H) 07/03/2023   CHOLHDL 4 07/03/2023

## 2023-08-29 ENCOUNTER — Other Ambulatory Visit: Payer: Self-pay | Admitting: Internal Medicine

## 2023-09-06 ENCOUNTER — Other Ambulatory Visit: Payer: Self-pay | Admitting: Internal Medicine

## 2023-10-26 ENCOUNTER — Other Ambulatory Visit: Payer: Self-pay | Admitting: Internal Medicine

## 2023-11-06 ENCOUNTER — Other Ambulatory Visit (INDEPENDENT_AMBULATORY_CARE_PROVIDER_SITE_OTHER): Payer: 59

## 2023-11-06 DIAGNOSIS — E7800 Pure hypercholesterolemia, unspecified: Secondary | ICD-10-CM | POA: Diagnosis not present

## 2023-11-06 DIAGNOSIS — R739 Hyperglycemia, unspecified: Secondary | ICD-10-CM | POA: Diagnosis not present

## 2023-11-06 LAB — LIPID PANEL
Cholesterol: 194 mg/dL (ref 0–200)
HDL: 44.8 mg/dL (ref >39.00)
LDL Cholesterol: 86 mg/dL (ref 0–99)
NonHDL: 149.43
Total CHOL/HDL Ratio: 4
Triglycerides: 316 mg/dL — ABNORMAL HIGH (ref 0.0–149.0)
VLDL: 63.2 mg/dL — ABNORMAL HIGH (ref 0.0–40.0)

## 2023-11-06 LAB — BASIC METABOLIC PANEL
BUN: 17 mg/dL (ref 6–23)
CO2: 29 meq/L (ref 19–32)
Calcium: 9.7 mg/dL (ref 8.4–10.5)
Chloride: 101 meq/L (ref 96–112)
Creatinine, Ser: 0.79 mg/dL (ref 0.40–1.20)
GFR: 79.39 mL/min (ref >60.00)
Glucose, Bld: 103 mg/dL — ABNORMAL HIGH (ref 70–99)
Potassium: 4.5 meq/L (ref 3.5–5.1)
Sodium: 139 meq/L (ref 135–145)

## 2023-11-06 LAB — HEPATIC FUNCTION PANEL
ALT: 28 U/L (ref 0–35)
AST: 24 U/L (ref 0–37)
Albumin: 4.8 g/dL (ref 3.5–5.2)
Alkaline Phosphatase: 58 U/L (ref 39–117)
Bilirubin, Direct: 0.1 mg/dL (ref 0.0–0.3)
Total Bilirubin: 0.5 mg/dL (ref 0.2–1.2)
Total Protein: 7.9 g/dL (ref 6.0–8.3)

## 2023-11-06 LAB — HEMOGLOBIN A1C: Hgb A1c MFr Bld: 6.2 % (ref 4.6–6.5)

## 2023-11-12 ENCOUNTER — Encounter: Payer: Self-pay | Admitting: Internal Medicine

## 2023-11-12 ENCOUNTER — Ambulatory Visit: Payer: 59 | Admitting: Internal Medicine

## 2023-11-12 VITALS — BP 120/78 | HR 74 | Temp 97.7°F | Ht 64.0 in | Wt 127.0 lb

## 2023-11-12 DIAGNOSIS — E7800 Pure hypercholesterolemia, unspecified: Secondary | ICD-10-CM | POA: Diagnosis not present

## 2023-11-12 DIAGNOSIS — E039 Hypothyroidism, unspecified: Secondary | ICD-10-CM | POA: Diagnosis not present

## 2023-11-12 DIAGNOSIS — R739 Hyperglycemia, unspecified: Secondary | ICD-10-CM | POA: Diagnosis not present

## 2023-11-12 DIAGNOSIS — Z860100 Personal history of colon polyps, unspecified: Secondary | ICD-10-CM

## 2023-11-12 DIAGNOSIS — I10 Essential (primary) hypertension: Secondary | ICD-10-CM | POA: Diagnosis not present

## 2023-11-12 NOTE — Progress Notes (Signed)
Subjective:    Patient ID: Kathryn Francis, female    DOB: 1961-06-29, 63 y.o.   MRN: [id number]  Patient here for  Chief Complaint  Patient presents with   Medical Management of Chronic Issues    HPI Here to follow up regarding hypercholesterolemia and hypertension. On micardis and metoprolol. Doing well. Exercises regularly. No chest pain or sob reported. No cough or congestion. No abdominal pain or bowel change reported. Discussed labs. Discussed increasing lipitor to daily.  Tolerating three days per week.    Past Medical History:  Diagnosis Date   Hypercholesterolemia    Hypertension    Hypothyroidism    Melanoma (HCC)    Patella fracture    right   Past Surgical History:  Procedure Laterality Date   BUNIONECTOMY  1999, 2000   ORIF ELBOW FRACTURE Left 06/16/2018   Procedure: OPEN REDUCTION INTERNAL FIXATION (ORIF) ELBOW/OLECRANON FRACTURE;  Surgeon: Kennedy Bucker, MD;  Location: [facility] ORS;  Service: Orthopedics;  Laterality: Left;   Family History  Problem Relation Age of Onset   Hypertension Other        siblings   Hypercholesterolemia Other        siblings   Heart disease Other        parent   Hypertension Other        parent   Lupus Other        sister   Breast cancer Neg Hx    Social History   Socioeconomic History   Marital status: Married    Spouse name: Not on file   Number of children: 2   Years of education: Not on file   Highest education level: Bachelor's degree (e.g., BA, AB, BS)  Occupational History   Not on file  Tobacco Use   Smoking status: Never   Smokeless tobacco: Never  Substance and Sexual Activity   Alcohol use: No    Alcohol/week: 0.0 standard drinks of alcohol   Drug use: No   Sexual activity: Not on file  Other Topics Concern   Not on file  Social History Narrative   Not on file   Social Drivers of Health   Financial Resource Strain: Low Risk  (11/11/2023)   Overall Financial Resource Strain (CARDIA)    Difficulty of  Paying Living Expenses: Not hard at all  Food Insecurity: No Food Insecurity (11/11/2023)   Hunger Vital Sign    Worried About Running Out of Food in the Last Year: Never true    Ran Out of Food in the Last Year: Never true  Transportation Needs: No Transportation Needs (11/11/2023)   PRAPARE - Administrator, Civil Service (Medical): No    Lack of Transportation (Non-Medical): No  Physical Activity: Sufficiently Active (11/11/2023)   Exercise Vital Sign    Days of Exercise per Week: 7 days    Minutes of Exercise per Session: 60 min  Stress: No Stress Concern Present (11/11/2023)   [employer] of Occupational Health - Occupational Stress Questionnaire    Feeling of Stress : Not at all  Social Connections: Socially Integrated (11/11/2023)   Social Connection and Isolation Panel [NHANES]    Frequency of Communication with Friends and Family: More than three times a week    Frequency of Social Gatherings with Friends and Family: Three times a week    Attends Religious Services: More than 4 times per year    Active Member of Clubs or Organizations: Yes    Attends Club or  Organization Meetings: More than 4 times per year    Marital Status: Married     Review of Systems  Constitutional:  Negative for appetite change and unexpected weight change.  HENT:  Negative for congestion and sinus pressure.   Respiratory:  Negative for cough, chest tightness and shortness of breath.   Cardiovascular:  Negative for chest pain, palpitations and leg swelling.  Gastrointestinal:  Negative for abdominal pain, diarrhea, nausea and vomiting.  Genitourinary:  Negative for difficulty urinating and dysuria.  Musculoskeletal:  Negative for joint swelling and myalgias.  Skin:  Negative for color change and rash.  Neurological:  Negative for dizziness and headaches.  Psychiatric/Behavioral:  Negative for agitation and dysphoric mood.        Objective:     BP 120/78   Pulse 74   Temp 97.7  F (36.5 C) (Oral)   Ht 5\' 4"  (1.626 m)   Wt 127 lb (57.6 kg)   SpO2 98%   BMI 21.80 kg/m  Wt Readings from Last 3 Encounters:  11/12/23 127 lb (57.6 kg)  07/07/23 129 lb 12.8 oz (58.9 kg)  03/03/23 127 lb (57.6 kg)    Physical Exam Vitals reviewed.  Constitutional:      General: She is not in acute distress.    Appearance: Normal appearance.  HENT:     Head: Normocephalic and atraumatic.     Right Ear: External ear normal.     Left Ear: External ear normal.     Mouth/Throat:     Pharynx: No oropharyngeal exudate or posterior oropharyngeal erythema.  Eyes:     General: No scleral icterus.       Right eye: No discharge.        Left eye: No discharge.     Conjunctiva/sclera: Conjunctivae normal.  Neck:     Thyroid: No thyromegaly.  Cardiovascular:     Rate and Rhythm: Normal rate and regular rhythm.  Pulmonary:     Effort: No respiratory distress.     Breath sounds: Normal breath sounds. No wheezing.  Abdominal:     General: Bowel sounds are normal.     Palpations: Abdomen is soft.     Tenderness: There is no abdominal tenderness.  Musculoskeletal:        General: No swelling or tenderness.     Cervical back: Neck supple. No tenderness.  Lymphadenopathy:     Cervical: No cervical adenopathy.  Skin:    Findings: No erythema or rash.  Neurological:     Mental Status: She is alert.  Psychiatric:        Mood and Affect: Mood normal.        Behavior: Behavior normal.         Outpatient Encounter Medications as of 11/12/2023  Medication Sig   alendronate (FOSAMAX) 70 MG tablet Take 70 mg by mouth once a week.  Patient states she was told to stop this medication 4 months ago by the lung doctor. Take with a full glass of water on an empty stomach.   atorvastatin (LIPITOR) 10 MG tablet TAKE 1 TABLET BY MOUTH EVERY DAY   ezetimibe (ZETIA) 10 MG tablet Take 1 tablet (10 mg total) by mouth daily.   hydrochlorothiazide (HYDRODIURIL) 12.5 MG tablet Take 12.5 mg by mouth  daily.   levothyroxine (SYNTHROID) 75 MCG tablet Take 1 tablet (75 mcg total) by mouth daily.   metoprolol tartrate (LOPRESSOR) 25 MG tablet TAKE 1/2 TABLET BY MOUTH EVERY DAY   Multiple Vitamin (MULTIVITAMIN)  tablet Take 1 tablet by mouth daily.   telmisartan (MICARDIS) 20 MG tablet TAKE 1 TABLET BY MOUTH EVERY DAY   zolpidem (AMBIEN) 5 MG tablet TAKE 1 TABLET BY MOUTH EVERY DAY AT BEDTIME AS NEEDED   No facility-administered encounter medications on file as of 11/12/2023.     Lab Results  Component Value Date   WBC 5.0 07/03/2023   HGB 13.0 07/03/2023   HCT 39.5 07/03/2023   PLT 278.0 07/03/2023   GLUCOSE 103 (H) 11/06/2023   CHOL 194 11/06/2023   TRIG 316.0 (H) 11/06/2023   HDL 44.80 11/06/2023   LDLDIRECT 94.0 02/20/2023   LDLCALC 86 11/06/2023   ALT 28 11/06/2023   AST 24 11/06/2023   NA 139 11/06/2023   K 4.5 11/06/2023   CL 101 11/06/2023   CREATININE 0.79 11/06/2023   BUN 17 11/06/2023   CO2 29 11/06/2023   TSH 2.00 07/03/2023   HGBA1C 6.2 11/06/2023    MM 3D SCREEN BREAST BILATERAL Result Date: 01/01/2023 CLINICAL DATA:  Screening. EXAM: DIGITAL SCREENING BILATERAL MAMMOGRAM WITH TOMOSYNTHESIS AND CAD TECHNIQUE: Bilateral screening digital craniocaudal and mediolateral oblique mammograms were obtained. Bilateral screening digital breast tomosynthesis was performed. The images were evaluated with computer-aided detection. COMPARISON:  Previous exam(s). ACR Breast Density Category b: There are scattered areas of fibroglandular density. FINDINGS: There are no findings suspicious for malignancy. IMPRESSION: No mammographic evidence of malignancy. A result letter of this screening mammogram will be mailed directly to the patient. RECOMMENDATION: Screening mammogram in one year. (Code:SM-B-01Y) BI-RADS CATEGORY  1: Negative. Electronically Signed   By: Frederico Hamman M.D.   On: 01/01/2023 11:24       Assessment & Plan:  Hypothyroidism, unspecified type Assessment &  Plan: On thyroid replacement.  Follow tsh.    Primary hypertension Assessment & Plan: On metoprolol and micardis.  Blood pressure doing well. Follow pressures.  Follow metabolic panel.    Hyperglycemia Assessment & Plan: Watches her diet.  Exercises regularly.  Follow met b and a1c.   Lab Results  Component Value Date   HGBA1C 6.2 11/06/2023     Hypercholesterolemia Assessment & Plan: She has adjusted her diet.  Exercises regularly. Feels good.  Follow lipid panel and liver function tests.  Continue lipitor and zetia. Will increase lipitor to daily. Follow.  Lab Results  Component Value Date   CHOL 194 11/06/2023   HDL 44.80 11/06/2023   LDLCALC 86 11/06/2023   LDLDIRECT 94.0 02/20/2023   TRIG 316.0 (H) 11/06/2023   CHOLHDL 4 11/06/2023      History of colonic polyps Assessment & Plan: Colonoscopy 12/2020.  Recommended f/u in 10 years.        Dale Plover, MD

## 2023-11-12 NOTE — Assessment & Plan Note (Signed)
Colonoscopy 12/2020.  Recommended f/u in 10 years.

## 2023-11-12 NOTE — Assessment & Plan Note (Signed)
On metoprolol and micardis.  Blood pressure doing well. Follow pressures.  Follow metabolic panel.

## 2023-11-12 NOTE — Assessment & Plan Note (Addendum)
She has adjusted her diet.  Exercises regularly. Feels good.  Follow lipid panel and liver function tests.  Continue lipitor and zetia. Will increase lipitor to daily. Follow.  Lab Results  Component Value Date   CHOL 194 11/06/2023   HDL 44.80 11/06/2023   LDLCALC 86 11/06/2023   LDLDIRECT 94.0 02/20/2023   TRIG 316.0 (H) 11/06/2023   CHOLHDL 4 11/06/2023

## 2023-11-12 NOTE — Assessment & Plan Note (Signed)
On thyroid replacement.  Follow tsh.  

## 2023-11-12 NOTE — Assessment & Plan Note (Signed)
Watches her diet.  Exercises regularly.  Follow met b and a1c.   Lab Results  Component Value Date   HGBA1C 6.2 11/06/2023

## 2024-01-08 ENCOUNTER — Other Ambulatory Visit: Payer: Self-pay | Admitting: Internal Medicine

## 2024-01-08 DIAGNOSIS — Z1231 Encounter for screening mammogram for malignant neoplasm of breast: Secondary | ICD-10-CM

## 2024-01-20 ENCOUNTER — Ambulatory Visit
Admission: RE | Admit: 2024-01-20 | Discharge: 2024-01-20 | Disposition: A | Discharge status: Home / Self Care | Source: Ambulatory Visit | Attending: Internal Medicine | Admitting: Internal Medicine

## 2024-01-20 DIAGNOSIS — Z1231 Encounter for screening mammogram for malignant neoplasm of breast: Secondary | ICD-10-CM | POA: Insufficient documentation

## 2024-01-23 ENCOUNTER — Other Ambulatory Visit: Payer: Self-pay | Admitting: Internal Medicine

## 2024-02-15 ENCOUNTER — Encounter: Payer: Self-pay | Admitting: Internal Medicine

## 2024-02-15 ENCOUNTER — Encounter

## 2024-02-15 ENCOUNTER — Ambulatory Visit: Admitting: Internal Medicine

## 2024-02-15 VITALS — BP 120/70 | HR 94 | Temp 97.9°F | Ht 64.0 in | Wt 127.6 lb

## 2024-02-15 DIAGNOSIS — R350 Frequency of micturition: Secondary | ICD-10-CM | POA: Diagnosis not present

## 2024-02-15 DIAGNOSIS — I10 Essential (primary) hypertension: Secondary | ICD-10-CM

## 2024-02-15 DIAGNOSIS — R300 Dysuria: Secondary | ICD-10-CM | POA: Diagnosis not present

## 2024-02-15 DIAGNOSIS — N3000 Acute cystitis without hematuria: Secondary | ICD-10-CM

## 2024-02-15 LAB — POC URINALSYSI DIPSTICK (AUTOMATED)
Bilirubin, UA: NEGATIVE
Glucose, UA: NEGATIVE
Ketones, UA: NEGATIVE
Nitrite, UA: NEGATIVE
Protein, UA: NEGATIVE
Spec Grav, UA: 1.015 (ref 1.010–1.025)
Urobilinogen, UA: 0.2 U/dL
pH, UA: 6 (ref 5.0–8.0)

## 2024-02-15 LAB — URINALYSIS, ROUTINE W REFLEX MICROSCOPIC
Bilirubin Urine: NEGATIVE
Ketones, ur: NEGATIVE
Nitrite: NEGATIVE
Specific Gravity, Urine: 1.015 (ref 1.000–1.030)
Total Protein, Urine: NEGATIVE
Urine Glucose: NEGATIVE
Urobilinogen, UA: 0.2 (ref 0.0–1.0)
pH: 6 (ref 5.0–8.0)

## 2024-02-15 MED ORDER — NITROFURANTOIN MONOHYD MACRO 100 MG PO CAPS: 100.0000 mg | ORAL_CAPSULE | Freq: Two times a day (BID) | ORAL | 0 refills | Status: DC

## 2024-02-15 NOTE — Assessment & Plan Note (Signed)
-   This problem chronic and stable -Patient blood pressure is 120/70 today -She is currently on metoprolol , telmisartan  and hydrochlorothiazide  -Will continue current medications for now

## 2024-02-15 NOTE — Assessment & Plan Note (Addendum)
-   Patient complains of increased urgency, frequency and dysuria since yesterday.  Denies hematuria.  No fevers chills.  Tolerating normal oral intake but did have nausea yesterday which is since resolved -On exam, patient has no CVA tenderness and her abdominal exam is benign -Urine dipstick is consistent with a urinary tract infection (positive leukocytes but no nitrates, small blood) -For patient's last UTI she was treated with Bactrim but her last potassium level was 4.5 and she is on an ARB as well so we will avoid this given risk of hyperkalemia with the combination of these medications -Will treat the patient with nitrofurantoin  100 mg twice daily to complete a 5-day course. -Will follow-up urine culture -Return precautions given to the patient

## 2024-02-15 NOTE — Progress Notes (Signed)
 Acute Office Visit  Subjective:     Patient ID: Kathryn Francis, female    DOB: 03-23-1961, 63 y.o.   MRN: [id number]  Chief Complaint  Patient presents with   Urinary Tract Infection   Urinary Frequency    Burning sensation     Urinary Tract Infection  Associated symptoms include frequency, nausea and urgency. Pertinent negatives include no chills, flank pain, hematuria or vomiting.  Urinary Frequency  Associated symptoms include frequency, nausea and urgency. Pertinent negatives include no chills, flank pain, hematuria or vomiting.   Patient is in today for increased urinary frequency, urgency and dysuria since yesterday.  Patient states that yesterday afternoon she developed increased frequency and urgency and then developed dysuria.  Patient states that she has had to go to bathroom multiple times yesterday and overnight.  She was nauseous yesterday but no nausea today.  No episodes of vomiting.  No fevers or chills.  No flank pain.  Tolerating oral intake.  Review of Systems  Constitutional: Negative.  Negative for chills, diaphoresis, fever and malaise/fatigue.  Respiratory: Negative.    Cardiovascular: Negative.   Gastrointestinal:  Positive for nausea. Negative for abdominal pain and vomiting.  Genitourinary:  Positive for dysuria, frequency and urgency. Negative for flank pain and hematuria.  Musculoskeletal: Negative.   Neurological: Negative.   Psychiatric/Behavioral: Negative.          Objective:    BP 120/70   Pulse 94   Temp 97.9 F (36.6 C) (Oral)   Ht 5\' 4"  (1.626 m)   Wt 127 lb 9.6 oz (57.9 kg)   SpO2 96%   BMI 21.90 kg/m    Physical Exam Constitutional:      Appearance: Normal appearance.  Cardiovascular:     Rate and Rhythm: Normal rate and regular rhythm.     Heart sounds: Normal heart sounds.  Pulmonary:     Effort: Pulmonary effort is normal. No respiratory distress.     Breath sounds: Normal breath sounds. No wheezing or rales.   Abdominal:     General: Bowel sounds are normal. There is no distension.     Palpations: Abdomen is soft.     Tenderness: There is no abdominal tenderness. There is no right CVA tenderness or left CVA tenderness.  Neurological:     Mental Status: She is alert and oriented to person, place, and time.  Psychiatric:        Mood and Affect: Mood normal.        Behavior: Behavior normal.     Results for orders placed or performed in visit on 02/15/24  POCT Urinalysis Dipstick (Automated)  Result Value Ref Range   Color, UA yellow    Clarity, UA clear    Glucose, UA Negative Negative   Bilirubin, UA negative    Ketones, UA negative    Spec Grav, UA 1.015 1.010 - 1.025   Blood, UA small (A)    pH, UA 6.0 5.0 - 8.0   Protein, UA Negative Negative   Urobilinogen, UA 0.2 0.2 or 1.0 E.U./dL   Nitrite, UA negative    Leukocytes, UA Large (3+) (A) Negative        Assessment & Plan:   Problem List Items Addressed This Visit       Cardiovascular and Mediastinum   Hypertension   - This problem chronic and stable -Patient blood pressure is 120/70 today -She is currently on metoprolol , telmisartan  and hydrochlorothiazide  -Will continue current medications for now  Genitourinary   UTI (urinary tract infection) - Primary   - Patient complains of increased urgency, frequency and dysuria since yesterday.  Denies hematuria.  No fevers chills.  Tolerating normal oral intake but did have nausea yesterday which is since resolved -On exam, patient has no CVA tenderness and her abdominal exam is benign -Urine dipstick is consistent with a urinary tract infection (positive leukocytes but no nitrates, small blood) -For patient's last UTI she was treated with Bactrim but her last potassium level was 4.5 and she is on an ARB as well so we will avoid this given risk of hyperkalemia with the combination of these medications -Will treat the patient with nitrofurantoin  100 mg twice daily to  complete a 5-day course. -Will follow-up urine culture -Return precautions given to the patient      Relevant Medications   nitrofurantoin , macrocrystal-monohydrate, (MACROBID ) 100 MG capsule   Other Visit Diagnoses       Burning with urination       Relevant Orders   POCT Urinalysis Dipstick (Automated) (Completed)   Urinalysis, Routine w reflex microscopic   Urine Culture     Urinary frequency       Relevant Orders   POCT Urinalysis Dipstick (Automated) (Completed)   Urinalysis, Routine w reflex microscopic   Urine Culture       Meds ordered this encounter  Medications   nitrofurantoin , macrocrystal-monohydrate, (MACROBID ) 100 MG capsule    Sig: Take 1 capsule (100 mg total) by mouth 2 (two) times daily.    Dispense:  10 capsule    Refill:  0    No follow-ups on file.  Tori Cupps, MD

## 2024-02-15 NOTE — Patient Instructions (Addendum)
-   It was a pleasure meeting you today -Your symptoms are secondary to a urinary tract infection -We will treat you with a course of antibiotics (nitrofurantoin  twice daily to complete a 5-day course).  Please take this antibiotic with food -If your symptoms are not improving or worsen please contact us  for further evaluation

## 2024-02-18 LAB — URINE CULTURE
MICRO NUMBER:: 16383020
SPECIMEN QUALITY:: ADEQUATE

## 2024-02-18 NOTE — Telephone Encounter (Signed)
 Fyi pt doing better

## 2024-03-03 ENCOUNTER — Other Ambulatory Visit: Payer: Self-pay

## 2024-03-03 DIAGNOSIS — R739 Hyperglycemia, unspecified: Secondary | ICD-10-CM

## 2024-03-03 DIAGNOSIS — E7800 Pure hypercholesterolemia, unspecified: Secondary | ICD-10-CM

## 2024-03-04 ENCOUNTER — Other Ambulatory Visit: Payer: Self-pay | Admitting: Internal Medicine

## 2024-03-11 ENCOUNTER — Other Ambulatory Visit: Payer: 59

## 2024-03-15 ENCOUNTER — Ambulatory Visit: Payer: 59 | Admitting: Internal Medicine

## 2024-03-15 ENCOUNTER — Other Ambulatory Visit: Payer: Self-pay

## 2024-03-15 ENCOUNTER — Encounter: Payer: Self-pay | Admitting: Internal Medicine

## 2024-03-15 VITALS — BP 120/70 | HR 72 | Temp 98.0°F | Resp 16 | Ht 64.0 in | Wt 126.2 lb

## 2024-03-15 DIAGNOSIS — E7800 Pure hypercholesterolemia, unspecified: Secondary | ICD-10-CM | POA: Diagnosis not present

## 2024-03-15 DIAGNOSIS — Z860100 Personal history of colon polyps, unspecified: Secondary | ICD-10-CM | POA: Diagnosis not present

## 2024-03-15 DIAGNOSIS — E039 Hypothyroidism, unspecified: Secondary | ICD-10-CM | POA: Diagnosis not present

## 2024-03-15 DIAGNOSIS — R739 Hyperglycemia, unspecified: Secondary | ICD-10-CM

## 2024-03-15 DIAGNOSIS — R319 Hematuria, unspecified: Secondary | ICD-10-CM

## 2024-03-15 LAB — URINALYSIS, ROUTINE W REFLEX MICROSCOPIC
Bilirubin Urine: NEGATIVE
Hgb urine dipstick: NEGATIVE
Ketones, ur: NEGATIVE
Leukocytes,Ua: NEGATIVE
Nitrite: NEGATIVE
RBC / HPF: NONE SEEN (ref 0–?)
Specific Gravity, Urine: 1.01 (ref 1.000–1.030)
Total Protein, Urine: NEGATIVE
Urine Glucose: NEGATIVE
Urobilinogen, UA: 0.2 (ref 0.0–1.0)
pH: 7 (ref 5.0–8.0)

## 2024-03-15 LAB — HEPATIC FUNCTION PANEL
ALT: 30 U/L (ref 0–35)
AST: 25 U/L (ref 0–37)
Albumin: 4.7 g/dL (ref 3.5–5.2)
Alkaline Phosphatase: 48 U/L (ref 39–117)
Bilirubin, Direct: 0.1 mg/dL (ref 0.0–0.3)
Total Bilirubin: 0.5 mg/dL (ref 0.2–1.2)
Total Protein: 7.4 g/dL (ref 6.0–8.3)

## 2024-03-15 LAB — BASIC METABOLIC PANEL WITH GFR
BUN: 17 mg/dL (ref 6–23)
CO2: 30 meq/L (ref 19–32)
Calcium: 9.9 mg/dL (ref 8.4–10.5)
Chloride: 102 meq/L (ref 96–112)
Creatinine, Ser: 0.78 mg/dL (ref 0.40–1.20)
GFR: 80.41 mL/min (ref >60.00)
Glucose, Bld: 103 mg/dL — ABNORMAL HIGH (ref 70–99)
Potassium: 4.8 meq/L (ref 3.5–5.1)
Sodium: 139 meq/L (ref 135–145)

## 2024-03-15 LAB — LIPID PANEL
Cholesterol: 148 mg/dL (ref 0–200)
HDL: 42.8 mg/dL (ref >39.00)
LDL Cholesterol: 49 mg/dL (ref 0–99)
NonHDL: 104.83
Total CHOL/HDL Ratio: 3
Triglycerides: 277 mg/dL — ABNORMAL HIGH (ref 0.0–149.0)
VLDL: 55.4 mg/dL — ABNORMAL HIGH (ref 0.0–40.0)

## 2024-03-15 LAB — HEMOGLOBIN A1C: Hgb A1c MFr Bld: 6 % (ref 4.6–6.5)

## 2024-03-15 MED ORDER — ATORVASTATIN CALCIUM 10 MG PO TABS: 10.0000 mg | ORAL_TABLET | Freq: Every day | ORAL | 2 refills | Status: DC

## 2024-03-15 MED ORDER — EZETIMIBE 10 MG PO TABS: 10.0000 mg | ORAL_TABLET | Freq: Every day | ORAL | 3 refills | Status: DC

## 2024-03-15 NOTE — Assessment & Plan Note (Signed)
 She monitors her diet. Exercises regularly. Taking lipitor daily and tolerating. Check lipid panel today.

## 2024-03-15 NOTE — Assessment & Plan Note (Signed)
 She monitors her diet. Exercises regularly. Check met b and A1c today.

## 2024-03-15 NOTE — Progress Notes (Signed)
 Subjective:    Patient ID: Kathryn Francis, female    DOB: 03/04/1961, 63 y.o.   MRN: [id number]  Patient here for  Chief Complaint  Patient presents with   Medical Management of Chronic Issues    HPI Here for a scheduled follow up - follow up regarding hypercholesterolemia and hypertension. Continues on micardis  and metoprolol . Last visit, lipitor increased to daily dosing. Tolerating. Staying active. No chest pain or sob reported. No cough or congestion. No abdominal pain or bowel change reported.    Past Medical History:  Diagnosis Date   Hypercholesterolemia    Hypertension    Hypothyroidism    Melanoma (HCC)    Patella fracture    right   Past Surgical History:  Procedure Laterality Date   BUNIONECTOMY  1999, 2000   ORIF ELBOW FRACTURE Left 06/16/2018   Procedure: OPEN REDUCTION INTERNAL FIXATION (ORIF) ELBOW/OLECRANON FRACTURE;  Surgeon: Molli Angelucci, MD;  Location: [facility] OR[facility];  Service: Orthopedics;  Laterality: Left;   Family History  Problem Relation Age of Onset   Hypertension Other        siblings   Hypercholesterolemia Other        siblings   Heart disease Other        parent   Hypertension Other        parent   Lupus Other        sister   Breast cancer Neg Hx    Social History   Socioeconomic History   Marital status: Married    Spouse name: Not on file   Number of children: 2   Years of education: Not on file   Highest education level: Bachelor's degree (e.g., BA, AB, BS)  Occupational History   Not on file  Tobacco Use   Smoking status: Never   Smokeless tobacco: Never  Substance and Sexual Activity   Alcohol use: No    Alcohol/week: 0.0 standard drinks of alcohol   Drug use: No   Sexual activity: Not on file  Other Topics Concern   Not on file  Social History Narrative   Not on file   Social Drivers of Health   Financial Resource Strain: Low Risk  (11/11/2023)   Overall Financial Resource Strain (CARDIA)    Difficulty of Paying  Living Expenses: Not hard at all  Food Insecurity: No Food Insecurity (11/11/2023)   Hunger Vital Sign    Worried About Running Out of Food in the Last Year: Never true    Ran Out of Food in the Last Year: Never true  Transportation Needs: No Transportation Needs (11/11/2023)   PRAPARE - Administrator, Civil Service (Medical): No    Lack of Transportation (Non-Medical): No  Physical Activity: Sufficiently Active (11/11/2023)   Exercise Vital Sign    Days of Exercise per Week: 7 days    Minutes of Exercise per Session: 60 min  Stress: No Stress Concern Present (11/11/2023)   [employer] of Occupational Health - Occupational Stress Questionnaire    Feeling of Stress : Not at all  Social Connections: Socially Integrated (11/11/2023)   Social Connection and Isolation Panel [NHANES]    Frequency of Communication with Friends and Family: More than three times a week    Frequency of Social Gatherings with Friends and Family: Three times a week    Attends Religious Services: More than 4 times per year    Active Member of Clubs or Organizations: Yes    Attends Banker  Meetings: More than 4 times per year    Marital Status: Married     Review of Systems  Constitutional:  Negative for appetite change and unexpected weight change.  HENT:  Negative for congestion and sinus pressure.   Respiratory:  Negative for cough, chest tightness and shortness of breath.   Cardiovascular:  Negative for chest pain, palpitations and leg swelling.  Gastrointestinal:  Negative for abdominal pain, diarrhea, nausea and vomiting.  Genitourinary:  Negative for difficulty urinating and dysuria.  Musculoskeletal:  Negative for joint swelling and myalgias.  Skin:  Negative for color change and rash.  Neurological:  Negative for dizziness and headaches.  Psychiatric/Behavioral:  Negative for agitation and dysphoric mood.        Objective:     BP 120/70   Pulse 72   Temp 98 F (36.7  C)   Resp 16   Ht 5\' 4"  (1.626 m)   Wt 126 lb 3.2 oz (57.2 kg)   SpO2 98%   BMI 21.66 kg/m  Wt Readings from Last 3 Encounters:  03/15/24 126 lb 3.2 oz (57.2 kg)  02/15/24 127 lb 9.6 oz (57.9 kg)  11/12/23 127 lb (57.6 kg)    Physical Exam Vitals reviewed.  Constitutional:      General: She is not in acute distress.    Appearance: Normal appearance.  HENT:     Head: Normocephalic and atraumatic.     Right Ear: External ear normal.     Left Ear: External ear normal.     Mouth/Throat:     Pharynx: No oropharyngeal exudate or posterior oropharyngeal erythema.  Eyes:     General: No scleral icterus.       Right eye: No discharge.        Left eye: No discharge.     Conjunctiva/sclera: Conjunctivae normal.  Neck:     Thyroid : No thyromegaly.  Cardiovascular:     Rate and Rhythm: Normal rate and regular rhythm.  Pulmonary:     Effort: No respiratory distress.     Breath sounds: Normal breath sounds. No wheezing.  Abdominal:     General: Bowel sounds are normal.     Palpations: Abdomen is soft.     Tenderness: There is no abdominal tenderness.  Musculoskeletal:        General: No swelling or tenderness.     Cervical back: Neck supple. No tenderness.  Lymphadenopathy:     Cervical: No cervical adenopathy.  Skin:    Findings: No erythema or rash.  Neurological:     Mental Status: She is alert.  Psychiatric:        Mood and Affect: Mood normal.        Behavior: Behavior normal.         Outpatient Encounter Medications as of 03/15/2024  Medication Sig   atorvastatin  (LIPITOR) 10 MG tablet TAKE 1 TABLET BY MOUTH EVERY DAY   hydrochlorothiazide  (HYDRODIURIL ) 12.5 MG tablet Take 12.5 mg by mouth daily.   levothyroxine  (SYNTHROID ) 75 MCG tablet TAKE 1 TABLET BY MOUTH EVERY DAY   metoprolol  tartrate (LOPRESSOR ) 25 MG tablet TAKE 1/2 TABLET BY MOUTH EVERY DAY   Multiple Vitamin (MULTIVITAMIN) tablet Take 1 tablet by mouth daily.   telmisartan  (MICARDIS ) 20 MG tablet  TAKE 1 TABLET BY MOUTH EVERY DAY   zolpidem  (AMBIEN ) 5 MG tablet TAKE 1 TABLET BY MOUTH EVERY DAY AT BEDTIME AS NEEDED   ezetimibe  (ZETIA ) 10 MG tablet Take 1 tablet (10 mg total) by mouth daily.   [  DISCONTINUED] alendronate (FOSAMAX) 70 MG tablet Take 70 mg by mouth once a week.  Patient states she was told to stop this medication 4 months ago by the lung doctor. Take with a full glass of water on an empty stomach. (Patient not taking: Reported on 02/15/2024)   [DISCONTINUED] nitrofurantoin , macrocrystal-monohydrate, (MACROBID ) 100 MG capsule Take 1 capsule (100 mg total) by mouth 2 (two) times daily.   No facility-administered encounter medications on file as of 03/15/2024.     Lab Results  Component Value Date   WBC 5.0 07/03/2023   HGB 13.0 07/03/2023   HCT 39.5 07/03/2023   PLT 278.0 07/03/2023   GLUCOSE 103 (H) 11/06/2023   CHOL 194 11/06/2023   TRIG 316.0 (H) 11/06/2023   HDL 44.80 11/06/2023   LDLDIRECT 94.0 02/20/2023   LDLCALC 86 11/06/2023   ALT 28 11/06/2023   AST 24 11/06/2023   NA 139 11/06/2023   K 4.5 11/06/2023   CL 101 11/06/2023   CREATININE 0.79 11/06/2023   BUN 17 11/06/2023   CO2 29 11/06/2023   TSH 2.00 07/03/2023   HGBA1C 6.2 11/06/2023    MM 3D SCREENING MAMMOGRAM BILATERAL BREAST Result Date: 01/21/2024 CLINICAL DATA:  Screening. EXAM: DIGITAL SCREENING BILATERAL MAMMOGRAM WITH TOMOSYNTHESIS AND CAD TECHNIQUE: Bilateral screening digital craniocaudal and mediolateral oblique mammograms were obtained. Bilateral screening digital breast tomosynthesis was performed. The images were evaluated with computer-aided detection. COMPARISON:  Previous exam(s). ACR Breast Density Category b: There are scattered areas of fibroglandular density. FINDINGS: There are no findings suspicious for malignancy. IMPRESSION: No mammographic evidence of malignancy. A result letter of this screening mammogram will be mailed directly to the patient. RECOMMENDATION: Screening mammogram  in one year. (Code:SM-B-01Y) BI-RADS CATEGORY  1: Negative. Electronically Signed   By: Alger Infield M.D.   On: 01/21/2024 12:58       Assessment & Plan:  Hypercholesterolemia Assessment & Plan: She monitors her diet. Exercises regularly. Taking lipitor daily and tolerating. Check lipid panel today.   Orders: -     Hepatic function panel -     Basic metabolic panel with GFR -     Lipid panel  Hyperglycemia Assessment & Plan: She monitors her diet. Exercises regularly. Check met b and A1c today.   Orders: -     Hemoglobin A1c  History of colonic polyps Assessment & Plan: Colonoscopy 12/2020.  Recommended f/u in 10 years.     Hypothyroidism, unspecified type Assessment & Plan: On thyroid  replacement.  Last tsh 06/2023 wnl. Follow tsh.    Hematuria, unspecified type Assessment & Plan: Recent UTI. Treated. Symptoms resolved. Check urine to confirm blood clear.   Orders: -     Urinalysis, Routine w reflex microscopic     Dellar Fenton, MD

## 2024-03-15 NOTE — Assessment & Plan Note (Signed)
 Recent UTI. Treated. Symptoms resolved. Check urine to confirm blood clear.

## 2024-03-15 NOTE — Assessment & Plan Note (Signed)
 On thyroid  replacement.  Last tsh 06/2023 wnl. Follow tsh.

## 2024-03-15 NOTE — Assessment & Plan Note (Signed)
Colonoscopy 12/2020.  Recommended f/u in 10 years.

## 2024-03-16 ENCOUNTER — Ambulatory Visit: Payer: Self-pay | Admitting: Internal Medicine

## 2024-03-22 ENCOUNTER — Other Ambulatory Visit: Payer: Self-pay | Admitting: Internal Medicine

## 2024-03-24 NOTE — Telephone Encounter (Signed)
Rx ok'd for ambien #30 with no refills.  

## 2024-03-24 NOTE — Telephone Encounter (Signed)
 Name of Medication: zolpidem  (ambien ) 5mg  Name of Pharmacy:  [id number] [place], [place] - [address]   Last Fill or Written Date and Quantity: 30 tab 0refills Last Office Visit and Type: 03/15/24 follow up routine care Next Office Visit and Type: 07/19/24 follow up routine care and review lab work Last Controlled Substance Agreement Date: none Last RUE:AVWU

## 2024-05-01 ENCOUNTER — Other Ambulatory Visit: Payer: Self-pay | Admitting: Internal Medicine

## 2024-07-05 ENCOUNTER — Telehealth: Payer: Self-pay | Admitting: Internal Medicine

## 2024-07-05 NOTE — Telephone Encounter (Signed)
 Left VM and sent MyChart message for pt to call back and reschedule follow-up visit from 9/30. Dr will be off that day.  E2C2, please reschedule with pt if they call bac -kh

## 2024-07-15 ENCOUNTER — Other Ambulatory Visit

## 2024-07-19 ENCOUNTER — Ambulatory Visit: Admitting: Internal Medicine

## 2024-07-24 ENCOUNTER — Other Ambulatory Visit: Payer: Self-pay | Admitting: Internal Medicine

## 2024-07-26 NOTE — Telephone Encounter (Signed)
 PDMP reviewed. Rx sent in for ambien  #30 with no refills.

## 2024-08-01 ENCOUNTER — Encounter: Payer: Self-pay | Admitting: Internal Medicine

## 2024-08-01 DIAGNOSIS — M546 Pain in thoracic spine: Secondary | ICD-10-CM | POA: Insufficient documentation

## 2024-08-19 ENCOUNTER — Other Ambulatory Visit: Payer: Self-pay | Admitting: *Deleted

## 2024-08-19 ENCOUNTER — Telehealth: Payer: Self-pay | Admitting: Internal Medicine

## 2024-08-19 ENCOUNTER — Encounter: Payer: Self-pay | Admitting: Internal Medicine

## 2024-08-19 DIAGNOSIS — E7800 Pure hypercholesterolemia, unspecified: Secondary | ICD-10-CM

## 2024-08-19 DIAGNOSIS — R739 Hyperglycemia, unspecified: Secondary | ICD-10-CM

## 2024-08-19 MED ORDER — METOPROLOL TARTRATE 25 MG PO TABS: 12.5000 mg | ORAL_TABLET | Freq: Every day | ORAL | 1 refills | Status: DC

## 2024-08-19 NOTE — Telephone Encounter (Signed)
 Labs ordered.

## 2024-08-19 NOTE — Telephone Encounter (Signed)
 Patient need lab orders.

## 2024-08-20 NOTE — Progress Notes (Signed)
 Order placed for future labs

## 2024-08-22 ENCOUNTER — Other Ambulatory Visit (INDEPENDENT_AMBULATORY_CARE_PROVIDER_SITE_OTHER)

## 2024-08-22 DIAGNOSIS — E7800 Pure hypercholesterolemia, unspecified: Secondary | ICD-10-CM | POA: Diagnosis not present

## 2024-08-22 DIAGNOSIS — R739 Hyperglycemia, unspecified: Secondary | ICD-10-CM

## 2024-08-22 LAB — CBC WITH DIFFERENTIAL/PLATELET
Basophils Absolute: 0 K/uL (ref 0.0–0.1)
Basophils Relative: 0.8 % (ref 0.0–3.0)
Eosinophils Absolute: 0.2 K/uL (ref 0.0–0.7)
Eosinophils Relative: 4.3 % (ref 0.0–5.0)
HCT: 39.3 % (ref 36.0–46.0)
Hemoglobin: 13.4 g/dL (ref 12.0–15.0)
Lymphocytes Relative: 35.4 % (ref 12.0–46.0)
Lymphs Abs: 1.6 K/uL (ref 0.7–4.0)
MCHC: 34 g/dL (ref 30.0–36.0)
MCV: 83.7 fl (ref 78.0–100.0)
Monocytes Absolute: 0.4 K/uL (ref 0.1–1.0)
Monocytes Relative: 7.8 % (ref 3.0–12.0)
Neutro Abs: 2.4 K/uL (ref 1.4–7.7)
Neutrophils Relative %: 51.7 % (ref 43.0–77.0)
Platelets: 297 K/uL (ref 150.0–400.0)
RBC: 4.7 Mil/uL (ref 3.87–5.11)
RDW: 12.9 % (ref 11.5–15.5)
WBC: 4.7 K/uL (ref 4.0–10.5)

## 2024-08-22 LAB — HEPATIC FUNCTION PANEL
ALT: 29 U/L (ref 0–35)
AST: 24 U/L (ref 0–37)
Albumin: 4.6 g/dL (ref 3.5–5.2)
Alkaline Phosphatase: 56 U/L (ref 39–117)
Bilirubin, Direct: 0.1 mg/dL (ref 0.0–0.3)
Total Bilirubin: 0.5 mg/dL (ref 0.2–1.2)
Total Protein: 7.5 g/dL (ref 6.0–8.3)

## 2024-08-22 LAB — LIPID PANEL
Cholesterol: 173 mg/dL (ref 0–200)
HDL: 48.5 mg/dL (ref >39.00)
LDL Cholesterol: 70 mg/dL (ref 0–99)
NonHDL: 124.03
Total CHOL/HDL Ratio: 4
Triglycerides: 270 mg/dL — ABNORMAL HIGH (ref 0.0–149.0)
VLDL: 54 mg/dL — ABNORMAL HIGH (ref 0.0–40.0)

## 2024-08-22 LAB — TSH: TSH: 0.76 u[IU]/mL (ref 0.35–5.50)

## 2024-08-22 LAB — BASIC METABOLIC PANEL WITH GFR
BUN: 16 mg/dL (ref 6–23)
CO2: 29 meq/L (ref 19–32)
Calcium: 9.8 mg/dL (ref 8.4–10.5)
Chloride: 102 meq/L (ref 96–112)
Creatinine, Ser: 0.77 mg/dL (ref 0.40–1.20)
GFR: 81.41 mL/min (ref >60.00)
Glucose, Bld: 98 mg/dL (ref 70–99)
Potassium: 5 meq/L (ref 3.5–5.1)
Sodium: 140 meq/L (ref 135–145)

## 2024-08-22 LAB — HEMOGLOBIN A1C: Hgb A1c MFr Bld: 6.4 % (ref 4.6–6.5)

## 2024-08-23 ENCOUNTER — Ambulatory Visit: Payer: Self-pay | Admitting: Internal Medicine

## 2024-08-25 ENCOUNTER — Encounter: Payer: Self-pay | Admitting: Internal Medicine

## 2024-08-25 ENCOUNTER — Ambulatory Visit: Admitting: Internal Medicine

## 2024-08-25 VITALS — BP 120/80 | HR 70 | Temp 97.8°F | Ht 62.5 in | Wt 129.1 lb

## 2024-08-25 DIAGNOSIS — Z1231 Encounter for screening mammogram for malignant neoplasm of breast: Secondary | ICD-10-CM | POA: Diagnosis not present

## 2024-08-25 DIAGNOSIS — Z860100 Personal history of colon polyps, unspecified: Secondary | ICD-10-CM

## 2024-08-25 DIAGNOSIS — M546 Pain in thoracic spine: Secondary | ICD-10-CM

## 2024-08-25 DIAGNOSIS — Z85820 Personal history of malignant melanoma of skin: Secondary | ICD-10-CM

## 2024-08-25 DIAGNOSIS — E7800 Pure hypercholesterolemia, unspecified: Secondary | ICD-10-CM

## 2024-08-25 DIAGNOSIS — R739 Hyperglycemia, unspecified: Secondary | ICD-10-CM | POA: Diagnosis not present

## 2024-08-25 DIAGNOSIS — R319 Hematuria, unspecified: Secondary | ICD-10-CM

## 2024-08-25 DIAGNOSIS — Z0000 Encounter for general adult medical examination without abnormal findings: Secondary | ICD-10-CM | POA: Diagnosis not present

## 2024-08-25 DIAGNOSIS — E875 Hyperkalemia: Secondary | ICD-10-CM

## 2024-08-25 DIAGNOSIS — I10 Essential (primary) hypertension: Secondary | ICD-10-CM

## 2024-08-25 DIAGNOSIS — E039 Hypothyroidism, unspecified: Secondary | ICD-10-CM

## 2024-08-25 MED ORDER — TELMISARTAN 20 MG PO TABS: 20.0000 mg | ORAL_TABLET | Freq: Every day | ORAL | 3 refills | Status: AC

## 2024-08-25 MED ORDER — ATORVASTATIN CALCIUM 10 MG PO TABS: 10.0000 mg | ORAL_TABLET | Freq: Every day | ORAL | 3 refills | Status: AC

## 2024-08-25 MED ORDER — METOPROLOL TARTRATE 25 MG PO TABS
12.5000 mg | ORAL_TABLET | Freq: Every day | ORAL | 3 refills | Status: AC
Start: 2024-08-25 — End: ?

## 2024-08-25 NOTE — Assessment & Plan Note (Signed)
 Physical today 08/25/24. SABRA  PAP 10/29/22 - negative with negative HPV - atrophic changes. Colonoscopy 01/02/21. Recommended f/u colonoscopy in 10 years.  Mammogram 01/20/24- Birads I.

## 2024-08-25 NOTE — Progress Notes (Signed)
 Subjective:    Patient ID: Kathryn Francis, female    DOB: 1961-01-22, 63 y.o.   MRN: [id number]  Patient here for  Chief Complaint  Patient presents with   Annual Exam    CPE    HPI Here for a physical exam. Recently evaluated by ortho - 08/01/24 - thoracic muscle strain. Recommended steroid taper. Hold meloxicam  while on steroids. Continue muscle relaxer. Continue heat and topical patches. She is still exercising. Trying to avoid positions that put her back in a strained position. No chest pain or sob reported. No abdominal pain or bowel change reported.    Past Medical History:  Diagnosis Date   Hypercholesterolemia    Hypertension    Hypothyroidism    Melanoma (HCC)    Patella fracture    right   Past Surgical History:  Procedure Laterality Date   BUNIONECTOMY  1999, 2000   ORIF ELBOW FRACTURE Left 06/16/2018   Procedure: OPEN REDUCTION INTERNAL FIXATION (ORIF) ELBOW/OLECRANON FRACTURE;  Surgeon: Kathlynn Sharper, MD;  Location: [facility];  Service: Orthopedics;  Laterality: Left;   Family History  Problem Relation Age of Onset   Hypertension Other        siblings   Hypercholesterolemia Other        siblings   Heart disease Other        parent   Hypertension Other        parent   Lupus Other        sister   Breast cancer Neg Hx    Social History   Socioeconomic History   Marital status: Married    Spouse name: Not on file   Number of children: 2   Years of education: Not on file   Highest education level: Bachelor's degree (e.g., BA, AB, BS)  Occupational History   Not on file  Tobacco Use   Smoking status: Never   Smokeless tobacco: Never  Substance and Sexual Activity   Alcohol use: No    Alcohol/week: 0.0 standard drinks of alcohol   Drug use: No   Sexual activity: Not on file  Other Topics Concern   Not on file  Social History Narrative   Not on file   Social Drivers of Health   Financial Resource Strain: Low Risk  (08/21/2024)   Overall  Financial Resource Strain (CARDIA)    Difficulty of Paying Living Expenses: Not hard at all  Food Insecurity: No Food Insecurity (08/21/2024)   Hunger Vital Sign    Worried About Running Out of Food in the Last Year: Never true    Ran Out of Food in the Last Year: Never true  Transportation Needs: No Transportation Needs (08/21/2024)   PRAPARE - Administrator, Civil Service (Medical): No    Lack of Transportation (Non-Medical): No  Physical Activity: Sufficiently Active (08/21/2024)   Exercise Vital Sign    Days of Exercise per Week: 7 days    Minutes of Exercise per Session: 90 min  Stress: No Stress Concern Present (08/21/2024)   [employer] of Occupational Health - Occupational Stress Questionnaire    Feeling of Stress: Not at all  Social Connections: Socially Integrated (08/21/2024)   Social Connection and Isolation Panel    Frequency of Communication with Friends and Family: More than three times a week    Frequency of Social Gatherings with Friends and Family: More than three times a week    Attends Religious Services: More than 4 times per year  Active Member of Clubs or Organizations: Yes    Attends Banker Meetings: More than 4 times per year    Marital Status: Married     Review of Systems  Constitutional:  Negative for appetite change and unexpected weight change.  HENT:  Negative for congestion, sinus pressure and sore throat.   Eyes:  Negative for pain and visual disturbance.  Respiratory:  Negative for cough, chest tightness and shortness of breath.   Cardiovascular:  Negative for chest pain, palpitations and leg swelling.  Gastrointestinal:  Negative for abdominal pain, diarrhea, nausea and vomiting.  Genitourinary:  Negative for difficulty urinating and dysuria.  Musculoskeletal:  Positive for back pain. Negative for joint swelling and myalgias.  Skin:  Negative for color change and rash.  Neurological:  Negative for dizziness and  headaches.  Hematological:  Negative for adenopathy. Does not bruise/bleed easily.  Psychiatric/Behavioral:  Negative for agitation and dysphoric mood.        Objective:     BP 120/80   Pulse 70   Temp 97.8 F (36.6 C) (Oral)   Ht 5' 2.5 (1.588 m)   Wt 129 lb 2 oz (58.6 kg)   SpO2 97%   BMI 23.24 kg/m  Wt Readings from Last 3 Encounters:  08/25/24 129 lb 2 oz (58.6 kg)  03/15/24 126 lb 3.2 oz (57.2 kg)  02/15/24 127 lb 9.6 oz (57.9 kg)    Physical Exam Vitals reviewed.  Constitutional:      General: She is not in acute distress.    Appearance: Normal appearance.  HENT:     Head: Normocephalic and atraumatic.     Right Ear: External ear normal.     Left Ear: External ear normal.     Mouth/Throat:     Pharynx: No oropharyngeal exudate or posterior oropharyngeal erythema.  Eyes:     General: No scleral icterus.       Right eye: No discharge.        Left eye: No discharge.     Conjunctiva/sclera: Conjunctivae normal.  Neck:     Thyroid : No thyromegaly.  Cardiovascular:     Rate and Rhythm: Normal rate and regular rhythm.  Pulmonary:     Effort: No respiratory distress.     Breath sounds: Normal breath sounds. No wheezing.  Abdominal:     General: Bowel sounds are normal.     Palpations: Abdomen is soft.     Tenderness: There is no abdominal tenderness.  Musculoskeletal:        General: No swelling or tenderness.     Cervical back: Neck supple. No tenderness.  Lymphadenopathy:     Cervical: No cervical adenopathy.  Skin:    Findings: No erythema or rash.  Neurological:     Mental Status: She is alert.  Psychiatric:        Mood and Affect: Mood normal.        Behavior: Behavior normal.         Outpatient Encounter Medications as of 08/25/2024  Medication Sig   ezetimibe  (ZETIA ) 10 MG tablet TAKE 1 TABLET BY MOUTH EVERY DAY   hydrochlorothiazide  (HYDRODIURIL ) 12.5 MG tablet Take 12.5 mg by mouth daily.   levothyroxine  (SYNTHROID ) 75 MCG tablet TAKE 1  TABLET BY MOUTH EVERY DAY   Multiple Vitamin (MULTIVITAMIN) tablet Take 1 tablet by mouth daily.   zolpidem  (AMBIEN ) 5 MG tablet TAKE 1 TABLET BY MOUTH EVERY DAY AT BEDTIME AS NEEDED   atorvastatin  (LIPITOR) 10 MG tablet  Take 1 tablet (10 mg total) by mouth daily.   metoprolol  tartrate (LOPRESSOR ) 25 MG tablet Take 0.5 tablets (12.5 mg total) by mouth daily.   telmisartan  (MICARDIS ) 20 MG tablet Take 1 tablet (20 mg total) by mouth daily.   [DISCONTINUED] atorvastatin  (LIPITOR) 10 MG tablet Take 1 tablet (10 mg total) by mouth daily.   [DISCONTINUED] metoprolol  tartrate (LOPRESSOR ) 25 MG tablet Take 0.5 tablets (12.5 mg total) by mouth daily.   [DISCONTINUED] telmisartan  (MICARDIS ) 20 MG tablet TAKE 1 TABLET BY MOUTH EVERY DAY   No facility-administered encounter medications on file as of 08/25/2024.     Lab Results  Component Value Date   WBC 4.7 08/22/2024   HGB 13.4 08/22/2024   HCT 39.3 08/22/2024   PLT 297.0 08/22/2024   GLUCOSE 98 08/22/2024   CHOL 173 08/22/2024   TRIG 270.0 (H) 08/22/2024   HDL 48.50 08/22/2024   LDLDIRECT 94.0 02/20/2023   LDLCALC 70 08/22/2024   ALT 29 08/22/2024   AST 24 08/22/2024   NA 140 08/22/2024   K 5.0 08/22/2024   CL 102 08/22/2024   CREATININE 0.77 08/22/2024   BUN 16 08/22/2024   CO2 29 08/22/2024   TSH 0.76 08/22/2024   HGBA1C 6.4 08/22/2024    MM 3D SCREENING MAMMOGRAM BILATERAL BREAST Result Date: 01/21/2024 CLINICAL DATA:  Screening. EXAM: DIGITAL SCREENING BILATERAL MAMMOGRAM WITH TOMOSYNTHESIS AND CAD TECHNIQUE: Bilateral screening digital craniocaudal and mediolateral oblique mammograms were obtained. Bilateral screening digital breast tomosynthesis was performed. The images were evaluated with computer-aided detection. COMPARISON:  Previous exam(s). ACR Breast Density Category b: There are scattered areas of fibroglandular density. FINDINGS: There are no findings suspicious for malignancy. IMPRESSION: No mammographic evidence of  malignancy. A result letter of this screening mammogram will be mailed directly to the patient. RECOMMENDATION: Screening mammogram in one year. (Code:SM-B-01Y) BI-RADS CATEGORY  1: Negative. Electronically Signed   By: Delon Music M.D.   On: 01/21/2024 12:58       Assessment & Plan:  Health care maintenance Assessment & Plan: Physical today 08/25/24. SABRA  PAP 10/29/22 - negative with negative HPV - atrophic changes. Colonoscopy 01/02/21. Recommended f/u colonoscopy in 10 years.  Mammogram 01/20/24- Birads I.    Hypercholesterolemia Assessment & Plan: She monitors her diet. Exercises regularly. Taking lipitor daily and tolerating. Follow lipid panel.   Orders: -     Lipid panel; Future -     Basic metabolic panel with GFR; Future -     Hepatic function panel; Future  Hyperglycemia Assessment & Plan: Continue exercise. Watches his diet. Follow met b and A1c.   Orders: -     Hemoglobin A1c; Future  Visit for screening mammogram -     3D Screening Mammogram, Left and Right; Future  Serum potassium elevated -     Potassium; Future  Hematuria, unspecified type Assessment & Plan: Previous UTI - repeat urine 02/2024 - no red blood cells.    Left-sided thoracic back pain, unspecified chronicity Assessment & Plan: Saw ortho 08/01/24 - thoracic muscle strain. Recommended steroid taper. Continue MR. Continue heat and topical patches. Exercising. Trying to avoid positions that put her back in a strained position.    Hypothyroidism, unspecified type Assessment & Plan: On thyroid  replacement.  Follow tsh.    Primary hypertension Assessment & Plan: On metoprolol  and micardis .  Blood pressure doing well. Follow pressures.  Follow metabolic panel.    History of melanoma Assessment & Plan: Followed by dermatology.    History of  colonic polyps Assessment & Plan: Colonoscopy 12/2020.  Recommended f/u in 10 years.     Other orders -     Atorvastatin  Calcium ; Take 1 tablet (10 mg  total) by mouth daily.  Dispense: 90 tablet; Refill: 3 -     Metoprolol  Tartrate; Take 0.5 tablets (12.5 mg total) by mouth daily.  Dispense: 45 tablet; Refill: 3 -     Telmisartan ; Take 1 tablet (20 mg total) by mouth daily.  Dispense: 90 tablet; Refill: 3     Allena Hamilton, MD

## 2024-09-04 ENCOUNTER — Encounter: Payer: Self-pay | Admitting: Internal Medicine

## 2024-09-04 NOTE — Assessment & Plan Note (Signed)
Colonoscopy 12/2020.  Recommended f/u in 10 years.

## 2024-09-04 NOTE — Assessment & Plan Note (Signed)
 Saw ortho 08/01/24 - thoracic muscle strain. Recommended steroid taper. Continue MR. Continue heat and topical patches. Exercising. Trying to avoid positions that put her back in a strained position.

## 2024-09-04 NOTE — Assessment & Plan Note (Signed)
 On thyroid replacement.  Follow tsh.

## 2024-09-04 NOTE — Assessment & Plan Note (Signed)
On metoprolol and micardis.  Blood pressure doing well. Follow pressures.  Follow metabolic panel.

## 2024-09-04 NOTE — Assessment & Plan Note (Signed)
 She monitors her diet. Exercises regularly. Taking lipitor daily and tolerating. Follow lipid panel.

## 2024-09-04 NOTE — Assessment & Plan Note (Signed)
 Previous UTI - repeat urine 02/2024 - no red blood cells.

## 2024-09-04 NOTE — Assessment & Plan Note (Signed)
 Followed by dermatology

## 2024-09-04 NOTE — Assessment & Plan Note (Signed)
 Continue exercise. Watches his diet. Follow met b and A1c.

## 2024-09-26 ENCOUNTER — Other Ambulatory Visit

## 2024-09-26 DIAGNOSIS — E875 Hyperkalemia: Secondary | ICD-10-CM

## 2024-09-26 LAB — POTASSIUM: Potassium: 4.3 meq/L (ref 3.5–5.1)

## 2024-09-27 ENCOUNTER — Ambulatory Visit: Payer: Self-pay | Admitting: Internal Medicine

## 2024-11-07 ENCOUNTER — Other Ambulatory Visit: Payer: Self-pay | Admitting: Internal Medicine

## 2024-11-17 ENCOUNTER — Telehealth: Payer: Self-pay

## 2024-11-17 NOTE — Telephone Encounter (Signed)
 Received clearance form from [facility]. Placed in folder for review

## 2024-11-17 NOTE — Telephone Encounter (Signed)
 Please notify her that we were notified she needed medical clearance for surgery. See if agreeable to come in Monday 11/21/24 - for pre op evaluation and clearance.

## 2024-11-18 NOTE — Telephone Encounter (Signed)
 Appointment made from 12/12/24

## 2024-12-12 ENCOUNTER — Ambulatory Visit: Admitting: Internal Medicine

## 2025-01-12 ENCOUNTER — Ambulatory Visit: Admit: 2025-01-12

## 2025-01-30 ENCOUNTER — Encounter

## 2025-02-20 ENCOUNTER — Other Ambulatory Visit

## 2025-02-23 ENCOUNTER — Ambulatory Visit: Admitting: Internal Medicine
